# Patient Record
Sex: Female | Born: 1960 | Race: White | Hispanic: No | Marital: Single | Smoking: Former smoker
Health system: Southern US, Community
[De-identification: ages and names within clinical notes are randomized; demographics above are authoritative.]

## PROBLEM LIST (undated history)

## (undated) DIAGNOSIS — E785 Hyperlipidemia, unspecified: Secondary | ICD-10-CM

## (undated) DIAGNOSIS — G43909 Migraine, unspecified, not intractable, without status migrainosus: Secondary | ICD-10-CM

## (undated) DIAGNOSIS — F32A Depression, unspecified: Secondary | ICD-10-CM

## (undated) DIAGNOSIS — Z923 Personal history of irradiation: Secondary | ICD-10-CM

## (undated) DIAGNOSIS — F329 Major depressive disorder, single episode, unspecified: Secondary | ICD-10-CM

## (undated) DIAGNOSIS — K589 Irritable bowel syndrome without diarrhea: Secondary | ICD-10-CM

## (undated) DIAGNOSIS — C50919 Malignant neoplasm of unspecified site of unspecified female breast: Secondary | ICD-10-CM

## (undated) DIAGNOSIS — Z789 Other specified health status: Secondary | ICD-10-CM

## (undated) HISTORY — PX: OOPHORECTOMY: SHX86

## (undated) HISTORY — PX: POLYPECTOMY: SHX149

## (undated) HISTORY — DX: Depression, unspecified: F32.A

## (undated) HISTORY — DX: Migraine, unspecified, not intractable, without status migrainosus: G43.909

## (undated) HISTORY — DX: Irritable bowel syndrome, unspecified: K58.9

## (undated) HISTORY — PX: HERNIA REPAIR: SHX51

## (undated) HISTORY — DX: Hyperlipidemia, unspecified: E78.5

## (undated) HISTORY — PX: TUBAL LIGATION: SHX77

---
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
## 2008-06-21 HISTORY — PX: CHOLECYSTECTOMY: SHX55

## 2011-07-08 ENCOUNTER — Other Ambulatory Visit: Payer: Self-pay | Admitting: Family Medicine

## 2011-07-08 DIAGNOSIS — M25511 Pain in right shoulder: Secondary | ICD-10-CM

## 2011-07-13 ENCOUNTER — Ambulatory Visit
Admission: RE | Admit: 2011-07-13 | Discharge: 2011-07-13 | Disposition: A | Discharge status: Home / Self Care | Payer: No Typology Code available for payment source | Source: Ambulatory Visit | Attending: Family Medicine | Admitting: Family Medicine

## 2011-07-13 DIAGNOSIS — M25511 Pain in right shoulder: Secondary | ICD-10-CM

## 2012-04-10 ENCOUNTER — Other Ambulatory Visit (HOSPITAL_COMMUNITY)
Admission: RE | Admit: 2012-04-10 | Discharge: 2012-04-10 | Disposition: A | Discharge status: Home / Self Care | Payer: 59 | Source: Ambulatory Visit | Attending: Family Medicine | Admitting: Family Medicine

## 2012-04-10 DIAGNOSIS — Z0000 Encounter for general adult medical examination without abnormal findings: Secondary | ICD-10-CM | POA: Insufficient documentation

## 2012-04-17 ENCOUNTER — Other Ambulatory Visit: Payer: Self-pay | Admitting: Physician Assistant

## 2012-04-17 DIAGNOSIS — Z1231 Encounter for screening mammogram for malignant neoplasm of breast: Secondary | ICD-10-CM

## 2012-05-23 ENCOUNTER — Ambulatory Visit
Admission: RE | Admit: 2012-05-23 | Discharge: 2012-05-23 | Disposition: A | Discharge status: Home / Self Care | Payer: 59 | Source: Ambulatory Visit | Attending: Physician Assistant | Admitting: Physician Assistant

## 2012-05-23 DIAGNOSIS — Z1231 Encounter for screening mammogram for malignant neoplasm of breast: Secondary | ICD-10-CM

## 2012-07-04 ENCOUNTER — Other Ambulatory Visit: Payer: Self-pay | Admitting: Physician Assistant

## 2012-07-04 DIAGNOSIS — R928 Other abnormal and inconclusive findings on diagnostic imaging of breast: Secondary | ICD-10-CM

## 2012-08-15 ENCOUNTER — Ambulatory Visit
Admission: RE | Admit: 2012-08-15 | Discharge: 2012-08-15 | Disposition: A | Discharge status: Home / Self Care | Payer: 59 | Source: Ambulatory Visit | Attending: Physician Assistant | Admitting: Physician Assistant

## 2012-08-15 ENCOUNTER — Other Ambulatory Visit: Payer: Self-pay | Admitting: Physician Assistant

## 2012-08-15 DIAGNOSIS — R928 Other abnormal and inconclusive findings on diagnostic imaging of breast: Secondary | ICD-10-CM

## 2012-08-15 DIAGNOSIS — R921 Mammographic calcification found on diagnostic imaging of breast: Secondary | ICD-10-CM

## 2012-08-16 ENCOUNTER — Ambulatory Visit
Admission: RE | Admit: 2012-08-16 | Discharge: 2012-08-16 | Disposition: A | Discharge status: Home / Self Care | Payer: 59 | Source: Ambulatory Visit | Attending: Physician Assistant | Admitting: Physician Assistant

## 2012-08-16 ENCOUNTER — Other Ambulatory Visit: Payer: Self-pay | Admitting: Physician Assistant

## 2012-08-16 DIAGNOSIS — R921 Mammographic calcification found on diagnostic imaging of breast: Secondary | ICD-10-CM

## 2012-08-16 HISTORY — PX: BREAST BIOPSY: SHX20

## 2012-08-17 ENCOUNTER — Other Ambulatory Visit: Payer: Self-pay | Admitting: Family Medicine

## 2012-08-17 ENCOUNTER — Ambulatory Visit
Admission: RE | Admit: 2012-08-17 | Discharge: 2012-08-17 | Disposition: A | Discharge status: Home / Self Care | Payer: 59 | Source: Ambulatory Visit | Attending: Physician Assistant | Admitting: Physician Assistant

## 2012-08-17 DIAGNOSIS — C50912 Malignant neoplasm of unspecified site of left female breast: Secondary | ICD-10-CM

## 2012-08-17 DIAGNOSIS — R921 Mammographic calcification found on diagnostic imaging of breast: Secondary | ICD-10-CM

## 2012-08-21 ENCOUNTER — Other Ambulatory Visit (INDEPENDENT_AMBULATORY_CARE_PROVIDER_SITE_OTHER): Payer: Self-pay | Admitting: Surgery

## 2012-08-21 DIAGNOSIS — C50912 Malignant neoplasm of unspecified site of left female breast: Secondary | ICD-10-CM

## 2012-08-22 ENCOUNTER — Encounter (INDEPENDENT_AMBULATORY_CARE_PROVIDER_SITE_OTHER): Payer: Self-pay | Admitting: Surgery

## 2012-08-22 ENCOUNTER — Ambulatory Visit (INDEPENDENT_AMBULATORY_CARE_PROVIDER_SITE_OTHER): Payer: 59 | Admitting: Surgery

## 2012-08-22 VITALS — BP 130/86 | HR 68 | Temp 97.8°F | Resp 16 | Ht 62.5 in | Wt 157.6 lb

## 2012-08-22 DIAGNOSIS — C50912 Malignant neoplasm of unspecified site of left female breast: Secondary | ICD-10-CM

## 2012-08-22 DIAGNOSIS — C50919 Malignant neoplasm of unspecified site of unspecified female breast: Secondary | ICD-10-CM

## 2012-08-22 NOTE — Progress Notes (Signed)
Patient ID: Destiny Harrell, female   DOB: 04/01/1961, 51 y.o.   MRN: [id number]  No chief complaint on file.   HPI Destiny Harrell is a 51 y.o. female.  Patient sent at the request of Dr.Arceo for left breast microcalcifications. 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 No history of breast mass, breast pain or nipple discharge. Mother had breast cancer and had mastectomy. No other first-degree relatives noted. HPI  Past Medical History  Diagnosis Date  . IBS (irritable bowel syndrome)     Past Surgical History  Procedure Laterality Date  . Cesarean section  1984, 1986, 1988  . Oophorectomy  Right 1999, Left 2001  . Cholecystectomy  2003    Family History  Problem Relation Age of Onset  . Cancer Mother     Breast Cancer  . Cancer Father     Hodgkin Lymphoma    Social History History  Substance Use Topics  . Smoking status: Current Every Day Smoker  . Smokeless tobacco: Not on file  . Alcohol Use: Yes    No Known Allergies  No current outpatient prescriptions on file.   No current facility-administered medications for this visit.    Review of Systems Review of Systems  Constitutional: Negative for fever, chills and unexpected weight change.  HENT: Negative for hearing loss, congestion, sore throat, trouble swallowing and voice change.   Eyes: Negative for visual disturbance.  Respiratory: Negative for cough and wheezing.   Cardiovascular: Negative for chest pain, palpitations and leg swelling.  Gastrointestinal: Negative for nausea, vomiting, abdominal pain, diarrhea, constipation, blood in stool, abdominal distention and anal bleeding.  Genitourinary: Negative for hematuria, vaginal bleeding and difficulty urinating.  Musculoskeletal: Negative for arthralgias.  Skin: Negative for rash and wound.  Neurological: Negative for seizures, syncope and headaches.  Hematological: Negative for adenopathy. Does not bruise/bleed easily.    Psychiatric/Behavioral: Negative for confusion.    Blood pressure 130/86, pulse 68, temperature 97.8 F (36.6 C), temperature source Temporal, resp. rate 16, height 5' 2.5" (1.588 m), weight 157 lb 9.6 oz (71.487 kg).  Physical Exam Physical Exam  Constitutional: She is oriented to person, place, and time. She appears well-developed and well-nourished.  HENT:  Head: Normocephalic and atraumatic.  Eyes: EOM are normal. Pupils are equal, round, and reactive to light.  Neck: Normal range of motion. Neck supple.  Cardiovascular: Normal rate and regular rhythm.   Pulmonary/Chest: Effort normal and breath sounds normal. Right breast exhibits no inverted nipple, no mass, no nipple discharge, no skin change and no tenderness. Left breast exhibits mass. Left breast exhibits no inverted nipple, no nipple discharge, no skin change and no tenderness. Breasts are symmetrical.    Musculoskeletal: Normal range of motion.  Neurological: She is alert and oriented to person, place, and time.  Skin: Skin is warm and dry.  Psychiatric: She has a normal mood and affect. Her behavior is normal. Thought content normal.    Data Reviewed Clinical Data: The patient was called back for calcifications on  recent screening mammogram  DIGITAL DIAGNOSTIC LEFT MAMMOGRAM  Comparison: Mammograms dated 05/24/2012, 06/29/2010, 06/02/2009 and  04/22/2008  Findings:  ACR Breast Density Category 2: There is a scattered fibroglandular  pattern.  Magnification views of the central portion of the left breast  demonstrate developing pleomorphic calcifications following a  ductal pattern measuring 3.5 cm. There is no associated mass.  IMPRESSION:  Suspicious left breast calcifications. Tissue sampling is  recommended.  RECOMMENDATION:  Stereotactic biopsy   of the left breast will be scheduled at the  patient's convenience.  I have discussed the findings and recommendations with the patient.  Results were also  provided in writing at the conclusion of the  visit.  BI-RADS CATEGORY 4: Suspicious abnormality - biopsy should be  considered.    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  Refer to medical oncology.  May need genetics as well.         Tyrisha Benninger A. 08/22/2012, 11:06 AM    

## 2012-08-22 NOTE — Patient Instructions (Signed)
Return next week after MRI and other results back to set up treatment planning.

## 2012-08-23 ENCOUNTER — Ambulatory Visit
Admission: RE | Admit: 2012-08-23 | Discharge: 2012-08-23 | Disposition: A | Discharge status: Home / Self Care | Payer: 59 | Source: Ambulatory Visit | Attending: Family Medicine | Admitting: Family Medicine

## 2012-08-23 DIAGNOSIS — C50912 Malignant neoplasm of unspecified site of left female breast: Secondary | ICD-10-CM

## 2012-08-23 MED ORDER — GADOBENATE DIMEGLUMINE 529 MG/ML IV SOLN
15.0000 mL | Freq: Once | INTRAVENOUS | Status: AC | PRN
  Administered 2012-08-23: 15 mL via INTRAVENOUS

## 2012-08-28 ENCOUNTER — Encounter (INDEPENDENT_AMBULATORY_CARE_PROVIDER_SITE_OTHER): Payer: Self-pay | Admitting: Surgery

## 2012-08-28 ENCOUNTER — Other Ambulatory Visit (INDEPENDENT_AMBULATORY_CARE_PROVIDER_SITE_OTHER): Payer: Self-pay

## 2012-08-28 ENCOUNTER — Encounter (HOSPITAL_BASED_OUTPATIENT_CLINIC_OR_DEPARTMENT_OTHER): Payer: Self-pay | Admitting: *Deleted

## 2012-08-28 ENCOUNTER — Ambulatory Visit (INDEPENDENT_AMBULATORY_CARE_PROVIDER_SITE_OTHER): Payer: 59 | Admitting: Surgery

## 2012-08-28 VITALS — BP 124/80 | HR 68 | Resp 12 | Ht 62.5 in | Wt 160.4 lb

## 2012-08-28 DIAGNOSIS — C50919 Malignant neoplasm of unspecified site of unspecified female breast: Secondary | ICD-10-CM

## 2012-08-28 DIAGNOSIS — C50912 Malignant neoplasm of unspecified site of left female breast: Secondary | ICD-10-CM

## 2012-08-28 NOTE — Patient Instructions (Addendum)
Swollen Lymph Nodes The lymphatic system filters fluid from around cells. It is like a system of blood vessels. These channels carry lymph instead of blood. The lymphatic system is an important part of the immune (disease fighting) system. When people talk about "swollen glands in the neck," they are usually talking about swollen lymph nodes. The lymph nodes are like the little traps for infection. You and your caregiver may be able to feel lymph nodes, especially swollen nodes, in these common areas: the groin (inguinal area), armpits (axilla), and above the clavicle (supraclavicular). You may also feel them in the neck (cervical) and the back of the head just above the hairline (occipital). Swollen glands occur when there is any condition in which the body responds with an allergic type of reaction. For instance, the glands in the neck can become swollen from insect bites or any type of minor infection on the head. These are very noticeable in children with only minor problems. Lymph nodes may also become swollen when there is a tumor or problem with the lymphatic system, such as Hodgkin's disease. TREATMENT   Most swollen glands do not require treatment. They can be observed (watched) for a short period of time, if your caregiver feels it is necessary. Most of the time, observation is not necessary.  Antibiotics (medicines that kill germs) may be prescribed by your caregiver. Your caregiver may prescribe these if he or she feels the swollen glands are due to a bacterial (germ) infection. Antibiotics are not used if the swollen glands are caused by a virus. HOME CARE INSTRUCTIONS   Take medications as directed by your caregiver. Only take over-the-counter or prescription medicines for pain, discomfort, or fever as directed by your caregiver. SEEK MEDICAL CARE IF:   If you begin to run a temperature greater than 102 F (38.9 C), or as your caregiver suggests. MAKE SURE YOU:   Understand these  instructions.  Will watch your condition.  Will get help right away if you are not doing well or get worse. Document Released: 05/28/2002 Document Revised: 08/30/2011 Document Reviewed: 06/07/2005 [facility] Patient Information 2013 [place], [place]. Lumpectomy, Breast Conserving Surgery A lumpectomy is breast surgery that removes only part of the breast. Another name used may be partial mastectomy. The amount removed varies. Make sure you understand how much of your breast will be removed. Reasons for a lumpectomy:  Any solid breast mass.  Grouped significant nodularity that may be confused with a solitary breast mass. Lumpectomy is the most common form of breast cancer surgery today. The surgeon removes the portion of your breast which contains the tumor (cancer). This is the lump. Some normal tissue around the lump is also removed to be sure that all the tumor has been removed.  If cancer cells are found in the margins where the breast tissue was removed, your surgeon will do more surgery to remove the remaining cancer tissue. This is called re-excision surgery. Radiation and/or chemotherapy treatments are often given following a lumpectomy to kill any cancer cells that could possibly remain.  REASONS YOU MAY NOT BE ABLE TO HAVE BREAST CONSERVING SURGERY:  The tumor is located in more than one place.  Your breast is small and the tumor is large so the breast would be disfigured.  The entire tumor removal is not successful with a lumpectomy.  You cannot commit to a full course of chemotherapy, radiation therapy or are pregnant and cannot have radiation.  You have previously had radiation to the breast  to treat cancer. HOW A LUMPECTOMY IS PERFORMED If overnight nursing is not required following a biopsy, a lumpectomy can be performed as a same-day surgery. This can be done in a hospital, clinic, or surgical center. The anesthesia used will depend on your surgeon. They will discuss this with  you. A general anesthetic keeps you sleeping through the procedure. LET YOUR CAREGIVERS KNOW ABOUT THE FOLLOWING:  Allergies  Medications taken including herbs, eye drops, over the counter medications, and creams.  Use of steroids (by mouth or creams)  Previous problems with anesthetics or Novocaine.  Possibility of pregnancy, if this applies  History of blood clots (thrombophlebitis)  History of bleeding or blood problems.  Previous surgery  Other health problems BEFORE THE PROCEDURE You should be present one hour prior to your procedure unless directed otherwise.  AFTER THE PROCEDURE  After surgery, you will be taken to the recovery area where a nurse will watch and check your progress. Once you're awake, stable, and taking fluids well, barring other problems you will be allowed to go home.  Ice packs applied to your operative site may help with discomfort and keep the swelling down.  A small rubber drain may be placed in the breast for a couple of days to prevent a hematoma from developing in the breast.  A pressure dressing may be applied for 24 to 48 hours to prevent bleeding.  Keep the wound dry.  You may resume a normal diet and activities as directed. Avoid strenuous activities affecting the arm on the side of the biopsy site such as tennis, swimming, heavy lifting (more than 10 pounds) or pulling.  Bruising in the breast is normal following this procedure.  Wearing a bra - even to bed - may be more comfortable and also help keep the dressing on.  Change dressings as directed.  Only take over-the-counter or prescription medicines for pain, discomfort, or fever as directed by your caregiver. Call for your results as instructed by your surgeon. Remember it is your responsibility to get the results of your lumpectomy if your surgeon asked you to follow-up. Do not assume everything is fine if you have not heard from your caregiver. SEEK MEDICAL CARE IF:   There is  increased bleeding (more than a small spot) from the wound.  You notice redness, swelling, or increasing pain in the wound.  Pus is coming from wound.  An unexplained oral temperature above 102 F (38.9 C) develops.  You notice a foul smell coming from the wound or dressing. SEEK IMMEDIATE MEDICAL CARE IF:   You develop a rash.  You have difficulty breathing.  You have any allergic problems. Document Released: 07/19/2006 Document Revised: 08/30/2011 Document Reviewed: 10/20/2006 [facility] Patient Information 2013 [place], [place]. Sentinel Lymph Node Analysis in Breast Cancer Treatment WHAT IS A LYMPH NODE? Lymph nodes are little glands that lie along the lymph channels and serve to trap infections in the body. These are the small vessel-like structures that carry the fluid (lymph) from body tissues away to be filtered. These are the "glands" that feel swollen in the neck when you or your child has a sore throat. These glands in your armpit are where breast cancer tends to spread first. WHAT IS SENTINEL LYMPH NODE ANALYSIS? Sentinel lymph node study is a new cancer diagnostic procedure. It aims to look at the most likely first spread of breast cancer. It involves looking at the lymph node or nodes where breast cancer is likely to travel next.  PROCEDURE  A small amount of blue dye and radioactive tracer are injected around the tumor in the breast. The dye and tracer follow the same path that a spreading cancer would be likely to follow. With the use of a Conservation officer, nature, the radioactive tracer can be located in the lymph node that is the gatekeeper to other lymph nodes in the armpit. The sentinel lymph node is the first lymph node in a chain of lymph nodes that drain the lymph from the breast. The blue dye enables the surgeon to identify this sentinel node. This node can be removed and examined. If no cancer is found in this node, no further removal of lymph nodes is recommended. In this case,  the spread of cancer to the other lymph nodes is rare. This eliminates any more surgery in the armpit and risk of complications. If the lymph node shows spread of the cancer from the breast, the other lymph nodes in the armpit are removed and analyzed. This helps the doctor and patient decide how much more surgery is needed and if chemotherapy and/or radiation treatment is necessary following the surgery.  BENEFITS  The pathology tests for this procedure are much more sensitive than was previously available.  This technique is thought to be a major improvement in the treatment of breast cancer.  This procedure allows many patients to avoid the effects of more extensive underarm lymph node removal and risk of complications (infection, bleeding, or severe arm swelling).  Survival rates from breast cancer are better and the risk of complications isreduced. Document Released: 06/07/2005 Document Revised: 08/30/2011 Document Reviewed: 02/21/2007 [facility] Patient Information 2013 [place], [place].

## 2012-08-28 NOTE — Progress Notes (Signed)
Patient ID: Destiny Harrell, female   DOB: 1961/06/09, 52 y.o.   MRN: [id number]  No chief complaint on file.   HPI Destiny Harrell is a 52 y.o. female.  Patient sent at the request of Dr.Arceo for left breast microcalcifications. 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 No history of breast mass, breast pain or nipple discharge. Mother had breast cancer and had mastectomy. No other first-degree relatives noted. Patient returns after MRI. HPI  Past Medical History  Diagnosis Date  . IBS (irritable bowel syndrome)     Past Surgical History  Procedure Laterality Date  . Cesarean section  1984, 1986, 1988  . Oophorectomy  Right 1999, Left 2001  . Cholecystectomy  2003    Family History  Problem Relation Age of Onset  . Cancer Mother     Breast Cancer  . Cancer Father     Hodgkin Lymphoma    Social History History  Substance Use Topics  . Smoking status: Former Smoker    Quit date: 08/21/2012  . Smokeless tobacco: Not on file  . Alcohol Use: Yes    No Known Allergies  No current outpatient prescriptions on file.   No current facility-administered medications for this visit.    Review of Systems Review of Systems  Constitutional: Negative for fever, chills and unexpected weight change.  HENT: Negative for hearing loss, congestion, sore throat, trouble swallowing and voice change.   Eyes: Negative for visual disturbance.  Respiratory: Negative for cough and wheezing.   Cardiovascular: Negative for chest pain, palpitations and leg swelling.  Gastrointestinal: Negative for nausea, vomiting, abdominal pain, diarrhea, constipation, blood in stool, abdominal distention and anal bleeding.  Genitourinary: Negative for hematuria, vaginal bleeding and difficulty urinating.  Musculoskeletal: Negative for arthralgias.  Skin: Negative for rash and wound.  Neurological: Negative for seizures, syncope and headaches.  Hematological: Negative for  adenopathy. Does not bruise/bleed easily.  Psychiatric/Behavioral: Negative for confusion.    Blood pressure 124/80, pulse 68, resp. rate 12, height 5' 2.5" (1.588 m), weight 160 lb 6.4 oz (72.757 kg).  Physical Exam Physical Exam  Constitutional: She is oriented to person, place, and time. She appears well-developed and well-nourished.  HENT:  Head: Normocephalic and atraumatic.  Eyes: EOM are normal. Pupils are equal, round, and reactive to light.  Neck: Normal range of motion. Neck supple.  Cardiovascular: Normal rate and regular rhythm.   Pulmonary/Chest: Effort normal and breath sounds normal. Right breast exhibits no inverted nipple, no mass, no nipple discharge, no skin change and no tenderness. Left breast exhibits mass. Left breast exhibits no inverted nipple, no nipple discharge, no skin change and no tenderness. Breasts are symmetrical.    Musculoskeletal: Normal range of motion.  Neurological: She is alert and oriented to person, place, and time.  Skin: Skin is warm and dry.  Psychiatric: She has a normal mood and affect. Her behavior is normal. Thought content normal.    Data Reviewed Clinical Data: The patient was called back for calcifications on  recent screening mammogram  DIGITAL DIAGNOSTIC LEFT MAMMOGRAM  Comparison: Mammograms dated 05/24/2012, 06/29/2010, 06/02/2009 and  04/22/2008  Findings:  ACR Breast Density Category 2: There is a scattered fibroglandular  pattern.  Magnification views of the central portion of the left breast  demonstrate developing pleomorphic calcifications following a  ductal pattern measuring 3.5 cm. There is no associated mass.  IMPRESSION:  Suspicious left breast calcifications. Tissue sampling is  recommended.  RECOMMENDATION:  Stereotactic biopsy of  the left breast will be scheduled at the  patient's convenience.  I have discussed the findings and recommendations with the patient.  Results were also provided in writing at  the conclusion of the  visit.  BI-RADS CATEGORY 4: Suspicious abnormality - biopsy should be  considered.  *RADIOLOGY REPORT*  Clinical Data: new diagnosis of dcis related to stereotactic biopsy  of calcifications central left breast  BILATERAL BREAST MRI WITH AND WITHOUT CONTRAST  Technique: Multiplanar, multisequence MR images of both breasts  were obtained prior to and following the intravenous administration  of 15ml of Multihance. Three dimensional images were evaluated at  the independent DynaCad workstation.  Comparison: mammograpy performed 05/24/12, 07/26/12, 08/16/12  Findings: There is mild background parenchymal enhancement. There  are no suspicious findings on the right.  On the left, there is a 2cm biopsy cavity associated with marker  clip in the central slightly superior breast. Extending anteriorly  and inferiorly from the clip is linear low-grade enhancement,  extending 3.5cm anterior to the clip/biopsy cavity. This is  distinctly separate from the biopsy tract, which extends directly  inferiorly to skin surface of the inferior breast.  There is no adenopathy or other suspicious finding.  IMPRESSION:  Suspicious linear enhancement extending 3.5cm anterior to the  biopsy site.  BI-RADS CATEGORY 6: Known biopsy-proven malignancy - appropriate  action should be taken.   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 Feel that lumpectomy as possible the situation I did cautioned against possible cosmetic deformity. I do not think it neoadjuvant chemotherapy would be helpful since most of this is linear enhancement consistent with DCIS with microinvasion. She would like to try breast conservation which I feel is reasonable. Or further medical oncology and genetics. She would like to proceed with surgical therapy. We'll arrange for left breast lumpectomy with sentinel lymph node  mapping.The procedure has been discussed with the patient. Alternatives to surgery have been discussed with the patient.  Risks of surgery include bleeding,  Infection,  Seroma formation, death,  and the need for further surgery.   The patient understands and wishes to proceed.Sentinel lymph node mapping and dissection has been discussed with the patient.  Risk of bleeding,  Infection,  Seroma formation,  Additional procedures,,  Shoulder weakness ,  Shoulder stiffness,  Nerve and blood vessel injury and reaction to the mapping dyes have been discussed.  Alternatives to surgery have been discussed with the patient.  The patient agrees to proceed.       CORNETT,THOMAS A. 08/28/2012, 11:06 AM

## 2012-08-28 NOTE — Progress Notes (Signed)
No labs needed

## 2012-08-31 ENCOUNTER — Ambulatory Visit (HOSPITAL_BASED_OUTPATIENT_CLINIC_OR_DEPARTMENT_OTHER)
Admission: RE | Admit: 2012-08-31 | Discharge: 2012-08-31 | Disposition: A | Discharge status: Home / Self Care | Payer: 59 | Source: Ambulatory Visit | Attending: Surgery | Admitting: Surgery

## 2012-08-31 ENCOUNTER — Encounter (HOSPITAL_BASED_OUTPATIENT_CLINIC_OR_DEPARTMENT_OTHER): Payer: Self-pay | Admitting: Anesthesiology

## 2012-08-31 ENCOUNTER — Encounter (HOSPITAL_BASED_OUTPATIENT_CLINIC_OR_DEPARTMENT_OTHER): Payer: Self-pay

## 2012-08-31 ENCOUNTER — Encounter (HOSPITAL_COMMUNITY)
Admission: RE | Admit: 2012-08-31 | Discharge: 2012-08-31 | Disposition: A | Discharge status: Home / Self Care | Payer: 59 | Source: Ambulatory Visit | Attending: Surgery | Admitting: Surgery

## 2012-08-31 ENCOUNTER — Ambulatory Visit (HOSPITAL_BASED_OUTPATIENT_CLINIC_OR_DEPARTMENT_OTHER): Payer: 59 | Admitting: Anesthesiology

## 2012-08-31 ENCOUNTER — Ambulatory Visit
Admission: RE | Admit: 2012-08-31 | Discharge: 2012-08-31 | Disposition: A | Discharge status: Home / Self Care | Payer: 59 | Source: Ambulatory Visit | Attending: Surgery | Admitting: Surgery

## 2012-08-31 ENCOUNTER — Encounter (HOSPITAL_BASED_OUTPATIENT_CLINIC_OR_DEPARTMENT_OTHER)
Admission: RE | Disposition: A | Discharge status: Home / Self Care | Payer: Self-pay | Source: Ambulatory Visit | Attending: Surgery

## 2012-08-31 DIAGNOSIS — C50919 Malignant neoplasm of unspecified site of unspecified female breast: Secondary | ICD-10-CM | POA: Insufficient documentation

## 2012-08-31 DIAGNOSIS — C50912 Malignant neoplasm of unspecified site of left female breast: Secondary | ICD-10-CM

## 2012-08-31 DIAGNOSIS — Z803 Family history of malignant neoplasm of breast: Secondary | ICD-10-CM | POA: Insufficient documentation

## 2012-08-31 DIAGNOSIS — D0590 Unspecified type of carcinoma in situ of unspecified breast: Secondary | ICD-10-CM

## 2012-08-31 DIAGNOSIS — Z87891 Personal history of nicotine dependence: Secondary | ICD-10-CM | POA: Insufficient documentation

## 2012-08-31 HISTORY — PX: BREAST LUMPECTOMY WITH NEEDLE LOCALIZATION AND AXILLARY SENTINEL LYMPH NODE BX: SHX5760

## 2012-08-31 HISTORY — PX: BREAST SURGERY: SHX581

## 2012-08-31 HISTORY — PX: BREAST LUMPECTOMY: SHX2

## 2012-08-31 HISTORY — DX: Other specified health status: Z78.9

## 2012-08-31 SURGERY — BREAST LUMPECTOMY WITH NEEDLE LOCALIZATION AND AXILLARY SENTINEL LYMPH NODE BX
Anesthesia: General | Site: Breast | Laterality: Left | Wound class: Clean

## 2012-08-31 MED ORDER — PROPOFOL 10 MG/ML IV BOLUS
INTRAVENOUS | Status: DC | PRN
  Administered 2012-08-31: 170 mg via INTRAVENOUS

## 2012-08-31 MED ORDER — DEXAMETHASONE SODIUM PHOSPHATE 4 MG/ML IJ SOLN
INTRAMUSCULAR | Status: DC | PRN
  Administered 2012-08-31: 10 mg via INTRAVENOUS

## 2012-08-31 MED ORDER — KETOROLAC TROMETHAMINE 30 MG/ML IJ SOLN
INTRAMUSCULAR | Status: DC | PRN
  Administered 2012-08-31: 30 mg via INTRAVENOUS

## 2012-08-31 MED ORDER — DEXTROSE 5 % IV SOLN
3.0000 g | INTRAVENOUS | Status: AC
  Administered 2012-08-31: 2 g via INTRAVENOUS

## 2012-08-31 MED ORDER — FENTANYL CITRATE 0.05 MG/ML IJ SOLN
100.0000 ug | Freq: Once | INTRAMUSCULAR | Status: AC
  Administered 2012-08-31: 100 ug via INTRAVENOUS

## 2012-08-31 MED ORDER — LACTATED RINGERS IV SOLN
INTRAVENOUS | Status: DC
  Administered 2012-08-31: 10:00:00 via INTRAVENOUS

## 2012-08-31 MED ORDER — OXYCODONE HCL 5 MG PO TABS
5.0000 mg | ORAL_TABLET | Freq: Once | ORAL | Status: AC
  Administered 2012-08-31: 5 mg via ORAL

## 2012-08-31 MED ORDER — OXYCODONE-ACETAMINOPHEN 5-325 MG PO TABS: 1.0000 | ORAL_TABLET | ORAL | Status: DC | PRN

## 2012-08-31 MED ORDER — LIDOCAINE HCL (CARDIAC) 20 MG/ML IV SOLN
INTRAVENOUS | Status: DC | PRN
  Administered 2012-08-31: 50 mg via INTRAVENOUS

## 2012-08-31 MED ORDER — MIDAZOLAM HCL 2 MG/2ML IJ SOLN
2.0000 mg | Freq: Once | INTRAMUSCULAR | Status: AC
  Administered 2012-08-31: 2 mg via INTRAVENOUS

## 2012-08-31 MED ORDER — ONDANSETRON HCL 4 MG/2ML IJ SOLN: 4.0000 mg | Freq: Once | INTRAMUSCULAR | Status: DC | PRN

## 2012-08-31 MED ORDER — ONDANSETRON HCL 4 MG/2ML IJ SOLN
INTRAMUSCULAR | Status: DC | PRN
  Administered 2012-08-31: 4 mg via INTRAVENOUS

## 2012-08-31 MED ORDER — HYDROMORPHONE HCL PF 1 MG/ML IJ SOLN
0.2500 mg | INTRAMUSCULAR | Status: DC | PRN
  Administered 2012-08-31: 0.5 mg via INTRAVENOUS

## 2012-08-31 MED ORDER — BUPIVACAINE-EPINEPHRINE PF 0.5-1:200000 % IJ SOLN
INTRAMUSCULAR | Status: DC | PRN
  Administered 2012-08-31: 20 mL

## 2012-08-31 MED ORDER — TECHNETIUM TC 99M SULFUR COLLOID FILTERED
1.0000 | Freq: Once | INTRAVENOUS | Status: AC | PRN
  Administered 2012-08-31: 1 via INTRADERMAL

## 2012-08-31 MED ORDER — ACETAMINOPHEN 10 MG/ML IV SOLN: 1000.0000 mg | Freq: Once | INTRAVENOUS | Status: DC | PRN

## 2012-08-31 MED ORDER — CHLORHEXIDINE GLUCONATE 4 % EX LIQD: 1.0000 "application " | Freq: Once | CUTANEOUS | Status: DC

## 2012-08-31 SURGICAL SUPPLY — 57 items
APPLIER CLIP 9.375 MED OPEN (MISCELLANEOUS) ×2
BANDAGE ELASTIC 6 VELCRO ST LF (GAUZE/BANDAGES/DRESSINGS) IMPLANT
BINDER BREAST LRG (GAUZE/BANDAGES/DRESSINGS) ×2 IMPLANT
BINDER BREAST MEDIUM (GAUZE/BANDAGES/DRESSINGS) IMPLANT
BINDER BREAST XLRG (GAUZE/BANDAGES/DRESSINGS) IMPLANT
BINDER BREAST XXLRG (GAUZE/BANDAGES/DRESSINGS) IMPLANT
BLADE SURG 10 STRL SS (BLADE) ×2 IMPLANT
BLADE SURG 15 STRL LF DISP TIS (BLADE) ×1 IMPLANT
BLADE SURG 15 STRL SS (BLADE) ×1
BLADE SURG ROTATE 9660 (MISCELLANEOUS) IMPLANT
CANISTER SUCTION 1200CC (MISCELLANEOUS) ×2 IMPLANT
CHLORAPREP W/TINT 26ML (MISCELLANEOUS) ×2 IMPLANT
CLIP APPLIE 9.375 MED OPEN (MISCELLANEOUS) ×1 IMPLANT
CLOTH BEACON ORANGE TIMEOUT ST (SAFETY) ×2 IMPLANT
COVER MAYO STAND STRL (DRAPES) ×2 IMPLANT
COVER PROBE W GEL 5X96 (DRAPES) ×2 IMPLANT
COVER TABLE BACK 60X90 (DRAPES) ×2 IMPLANT
DECANTER SPIKE VIAL GLASS SM (MISCELLANEOUS) IMPLANT
DERMABOND ADVANCED (GAUZE/BANDAGES/DRESSINGS) ×2
DERMABOND ADVANCED .7 DNX12 (GAUZE/BANDAGES/DRESSINGS) ×2 IMPLANT
DEVICE DUBIN W/COMP PLATE 8390 (MISCELLANEOUS) IMPLANT
DRAIN CHANNEL 19F RND (DRAIN) IMPLANT
DRAIN HEMOVAC 1/8 X 5 (WOUND CARE) IMPLANT
DRAPE LAPAROSCOPIC ABDOMINAL (DRAPES) ×2 IMPLANT
DRAPE UTILITY XL STRL (DRAPES) ×2 IMPLANT
ELECT COATED BLADE 2.86 ST (ELECTRODE) ×2 IMPLANT
ELECT REM PT RETURN 9FT ADLT (ELECTROSURGICAL) ×2
ELECTRODE REM PT RTRN 9FT ADLT (ELECTROSURGICAL) ×1 IMPLANT
EVACUATOR SILICONE 100CC (DRAIN) IMPLANT
GAUZE SPONGE 4X4 12PLY STRL LF (GAUZE/BANDAGES/DRESSINGS) IMPLANT
GLOVE BIOGEL PI IND STRL 8 (GLOVE) ×1 IMPLANT
GLOVE BIOGEL PI INDICATOR 8 (GLOVE) ×1
GLOVE ECLIPSE 8.0 STRL XLNG CF (GLOVE) ×2 IMPLANT
GOWN PREVENTION PLUS XLARGE (GOWN DISPOSABLE) ×2 IMPLANT
HEMOSTAT SURGICEL 2X14 (HEMOSTASIS) ×2 IMPLANT
KIT MARKER MARGIN INK (KITS) ×2 IMPLANT
NDL SAFETY ECLIPSE 18X1.5 (NEEDLE) IMPLANT
NEEDLE HYPO 18GX1.5 SHARP (NEEDLE)
NEEDLE HYPO 25X1 1.5 SAFETY (NEEDLE) ×2 IMPLANT
NS IRRIG 1000ML POUR BTL (IV SOLUTION) ×2 IMPLANT
PACK BASIN DAY SURGERY FS (CUSTOM PROCEDURE TRAY) ×2 IMPLANT
PENCIL BUTTON HOLSTER BLD 10FT (ELECTRODE) ×2 IMPLANT
PIN SAFETY STERILE (MISCELLANEOUS) IMPLANT
SLEEVE SCD COMPRESS KNEE MED (MISCELLANEOUS) ×2 IMPLANT
SPONGE LAP 18X18 X RAY DECT (DISPOSABLE) IMPLANT
SPONGE LAP 4X18 X RAY DECT (DISPOSABLE) ×2 IMPLANT
SUT ETHILON 3 0 PS 1 (SUTURE) IMPLANT
SUT MNCRL AB 3-0 PS2 18 (SUTURE) ×4 IMPLANT
SUT SILK 2 0 SH (SUTURE) IMPLANT
SUT VICRYL 3-0 CR8 SH (SUTURE) ×2 IMPLANT
SYR BULB 3OZ (MISCELLANEOUS) ×2 IMPLANT
SYR CONTROL 10ML LL (SYRINGE) ×2 IMPLANT
TOWEL OR 17X24 6PK STRL BLUE (TOWEL DISPOSABLE) ×2 IMPLANT
TOWEL OR NON WOVEN STRL DISP B (DISPOSABLE) IMPLANT
TUBE CONNECTING 20X1/4 (TUBING) ×2 IMPLANT
WATER STERILE IRR 1000ML POUR (IV SOLUTION) IMPLANT
YANKAUER SUCT BULB TIP NO VENT (SUCTIONS) ×2 IMPLANT

## 2012-08-31 NOTE — Interval H&P Note (Signed)
History and Physical Interval Note:  08/31/2012 9:28 AM  Destiny Harrell  has presented today for surgery, with the diagnosis of left breast cancer   The various methods of treatment have been discussed with the patient and family. After consideration of risks, benefits and other options for treatment, the patient has consented to  Procedure(s) with comments: left BREAST needle localized  LUMPECTOMY WITH left sentinel lymph node mapping  (Left) - needle localization at breast center of [place] 7:30 nuclear medicine injection at 9:30 neoprobe   as a surgical intervention .  The patient's history has been reviewed, patient examined, no change in status, stable for surgery.  I have reviewed the patient's chart and labs.  Questions were answered to the patient's satisfaction.     CORNETT,THOMAS A.

## 2012-08-31 NOTE — Op Note (Signed)
Left Breast Lumpectomy with Sentinal Node Biopsy Procedure Note  Indications: This patient presents with history of left breast cancer with clinically negative axillary lymph node exam.The procedure has been discussed with the patient. Alternatives to surgery have been discussed with the patient.  Risks of surgery include bleeding,  Infection,  Seroma formation, death,  and the need for further surgery.   The patient understands and wishes to proceed.Sentinel lymph node mapping and dissection has been discussed with the patient.  Risk of bleeding,  Infection,  Seroma formation,  Additional procedures,,  Shoulder weakness ,  Shoulder stiffness,  Nerve and blood vessel injury and reaction to the mapping dyes have been discussed.  Alternatives to surgery have been discussed with the patient.  The patient agrees to proceed.  Pre-operative Diagnosis: left breast cancer  Post-operative Diagnosis: left breast cancer  Surgeon: Harriette Bouillon A.   Assistants: OR   Anesthesia: General LMA anesthesia and Local anesthesia 0.5% bupivacaine, with epinephrine  ASA Class: 2  Procedure Details  The patient was seen in the Holding Room. The risks, benefits, complications, treatment options, and expected outcomes were discussed with the patient. The possibilities of reaction to medication, pulmonary aspiration, bleeding, infection, the need for additional procedures, failure to diagnose a condition, and creating a complication requiring transfusion or operation were discussed with the patient. The patient concurred with the proposed plan, giving informed consent.  The site of surgery properly noted/marked. The patient was taken to Operating Room # 8, identified as Destiny Harrell and the procedure verified as Breast Lumpectomy and Sentinal Node Biopsy. A Time Out was held and the above information confirmed.  After induction of anesthesia, the left arm, breast, and chest were prepped and draped in standard fashion.   Using a hand-held gamma probe, axillary sentinel nodes were identified transcutaneously.  An oblique incision was created below the axillary hairline.  Dissection was carried through the clavipectoral fascia.  1 level 1 axillary sentinel nodes were removed and submitted to pathology.  Surgicel placed into the wound.   The axillary incision was closed with a 3-0 Vicryl and 4 O monocryl  subcuticular closure in layers.    The lumpectomy was performed by creating an oblique incision over the upper inner quadrant of the breastaround the previously placed localization guidewire.  Dissection was carried down to the pectoral fascia.  Orientation sutures were placed and the skin and pectoralis margins were inked. Clips placed.   Specimen radiography confirmed inclusion of the mammographic lesion.  Hemostasis was achieved with cautery.  The wound was irrigated and closed with a 3-0 Vicryl  And 4 O monocryl subcuticular closure in layers.    Sterile dressings were applied of Dermabond.  At the end of the operation, all sponge, instrument, and needle counts were correct.  Findings: grossly clear surgical margins  Estimated Blood Loss:  Minimal         Drains: none         Total IV Fluids: 1000 ml         Specimens: see above                 Complications:  None; patient tolerated the procedure well.         Disposition: PACU - hemodynamically stable.         Condition: stable  Attending Attestation: I performed the procedure.

## 2012-08-31 NOTE — Progress Notes (Signed)
Tolerated well nuc med injections

## 2012-08-31 NOTE — Transfer of Care (Signed)
Immediate Anesthesia Transfer of Care Note  Patient: Destiny Harrell  Procedure(s) Performed: Procedure(s) with comments: left BREAST needle localized  LUMPECTOMY WITH left sentinel lymph node mapping  (Left) - needle localization at breast center of [place] 7:30 nuclear medicine injection at 9:30 neoprobe    Patient Location: PACU  Anesthesia Type:General  Level of Consciousness: awake, sedated and patient cooperative  Airway & Oxygen Therapy: Patient Spontanous Breathing and Patient connected to face mask oxygen  Post-op Assessment: Report given to PACU RN and Post -op Vital signs reviewed and stable  Post vital signs: Reviewed and stable  Complications: No apparent anesthesia complications

## 2012-08-31 NOTE — Anesthesia Procedure Notes (Signed)
Procedure Name: LMA Insertion Date/Time: 08/31/2012 10:26 AM Performed by: Gar Gibbon Pre-anesthesia Checklist: Patient identified, Emergency Drugs available, Suction available and Patient being monitored Patient Re-evaluated:Patient Re-evaluated prior to inductionOxygen Delivery Method: Circle System Utilized Preoxygenation: Pre-oxygenation with 100% oxygen Intubation Type: IV induction Ventilation: Mask ventilation without difficulty LMA: LMA inserted LMA Size: 4.0 Number of attempts: 1 Airway Equipment and Method: bite block Placement Confirmation: positive ETCO2 Tube secured with: Tape Dental Injury: Teeth and Oropharynx as per pre-operative assessment

## 2012-08-31 NOTE — Anesthesia Preprocedure Evaluation (Signed)
Anesthesia Evaluation  Patient identified by MRN, date of birth, ID band Patient awake    Reviewed: Allergy & Precautions, H&P , NPO status   Airway Mallampati: II      Dental  (+) Teeth Intact and Dental Advisory Given   Pulmonary  breath sounds clear to auscultation        Cardiovascular Rhythm:Regular Rate:Normal     Neuro/Psych    GI/Hepatic   Endo/Other    Renal/GU      Musculoskeletal   Abdominal   Peds  Hematology   Anesthesia Other Findings   Reproductive/Obstetrics                           Anesthesia Physical Anesthesia Plan  ASA: I  Anesthesia Plan: General   Post-op Pain Management:    Induction: Intravenous  Airway Management Planned: LMA  Additional Equipment:   Intra-op Plan:   Post-operative Plan:   Informed Consent: I have reviewed the patients History and Physical, chart, labs and discussed the procedure including the risks, benefits and alternatives for the proposed anesthesia with the patient or authorized representative who has indicated his/her understanding and acceptance.   Dental advisory given  Plan Discussed with: CRNA and Surgeon  Anesthesia Plan Comments:         Anesthesia Quick Evaluation

## 2012-08-31 NOTE — H&P (View-Only) (Signed)
Patient ID: Destiny Harrell, female   DOB: 03/17/1961, 51 y.o.   MRN: [id number]  No chief complaint on file.   HPI Destiny Harrell is a 51 y.o. female.  Patient sent at the request of Dr.Arceo for left breast microcalcifications. 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 No history of breast mass, breast pain or nipple discharge. Mother had breast cancer and had mastectomy. No other first-degree relatives noted. Patient returns after MRI. HPI  Past Medical History  Diagnosis Date  . IBS (irritable bowel syndrome)     Past Surgical History  Procedure Laterality Date  . Cesarean section  1984, 1986, 1988  . Oophorectomy  Right 1999, Left 2001  . Cholecystectomy  2003    Family History  Problem Relation Age of Onset  . Cancer Mother     Breast Cancer  . Cancer Father     Hodgkin Lymphoma    Social History History  Substance Use Topics  . Smoking status: Former Smoker    Quit date: 08/21/2012  . Smokeless tobacco: Not on file  . Alcohol Use: Yes    No Known Allergies  No current outpatient prescriptions on file.   No current facility-administered medications for this visit.    Review of Systems Review of Systems  Constitutional: Negative for fever, chills and unexpected weight change.  HENT: Negative for hearing loss, congestion, sore throat, trouble swallowing and voice change.   Eyes: Negative for visual disturbance.  Respiratory: Negative for cough and wheezing.   Cardiovascular: Negative for chest pain, palpitations and leg swelling.  Gastrointestinal: Negative for nausea, vomiting, abdominal pain, diarrhea, constipation, blood in stool, abdominal distention and anal bleeding.  Genitourinary: Negative for hematuria, vaginal bleeding and difficulty urinating.  Musculoskeletal: Negative for arthralgias.  Skin: Negative for rash and wound.  Neurological: Negative for seizures, syncope and headaches.  Hematological: Negative for  adenopathy. Does not bruise/bleed easily.  Psychiatric/Behavioral: Negative for confusion.    Blood pressure 124/80, pulse 68, resp. rate 12, height 5' 2.5" (1.588 m), weight 160 lb 6.4 oz (72.757 kg).  Physical Exam Physical Exam  Constitutional: She is oriented to person, place, and time. She appears well-developed and well-nourished.  HENT:  Head: Normocephalic and atraumatic.  Eyes: EOM are normal. Pupils are equal, round, and reactive to light.  Neck: Normal range of motion. Neck supple.  Cardiovascular: Normal rate and regular rhythm.   Pulmonary/Chest: Effort normal and breath sounds normal. Right breast exhibits no inverted nipple, no mass, no nipple discharge, no skin change and no tenderness. Left breast exhibits mass. Left breast exhibits no inverted nipple, no nipple discharge, no skin change and no tenderness. Breasts are symmetrical.    Musculoskeletal: Normal range of motion.  Neurological: She is alert and oriented to person, place, and time.  Skin: Skin is warm and dry.  Psychiatric: She has a normal mood and affect. Her behavior is normal. Thought content normal.    Data Reviewed Clinical Data: The patient was called back for calcifications on  recent screening mammogram  DIGITAL DIAGNOSTIC LEFT MAMMOGRAM  Comparison: Mammograms dated 05/24/2012, 06/29/2010, 06/02/2009 and  04/22/2008  Findings:  ACR Breast Density Category 2: There is a scattered fibroglandular  pattern.  Magnification views of the central portion of the left breast  demonstrate developing pleomorphic calcifications following a  ductal pattern measuring 3.5 cm. There is no associated mass.  IMPRESSION:  Suspicious left breast calcifications. Tissue sampling is  recommended.  RECOMMENDATION:  Stereotactic biopsy of   the left breast will be scheduled at the  patient's convenience.  I have discussed the findings and recommendations with the patient.  Results were also provided in writing at  the conclusion of the  visit.  BI-RADS CATEGORY 4: Suspicious abnormality - biopsy should be  considered.  *RADIOLOGY REPORT*  Clinical Data: new diagnosis of dcis related to stereotactic biopsy  of calcifications central left breast  BILATERAL BREAST MRI WITH AND WITHOUT CONTRAST  Technique: Multiplanar, multisequence MR images of both breasts  were obtained prior to and following the intravenous administration  of 15ml of Multihance. Three dimensional images were evaluated at  the independent DynaCad workstation.  Comparison: mammograpy performed 05/24/12, 07/26/12, 08/16/12  Findings: There is mild background parenchymal enhancement. There  are no suspicious findings on the right.  On the left, there is a 2cm biopsy cavity associated with marker  clip in the central slightly superior breast. Extending anteriorly  and inferiorly from the clip is linear low-grade enhancement,  extending 3.5cm anterior to the clip/biopsy cavity. This is  distinctly separate from the biopsy tract, which extends directly  inferiorly to skin surface of the inferior breast.  There is no adenopathy or other suspicious finding.  IMPRESSION:  Suspicious linear enhancement extending 3.5cm anterior to the  biopsy site.  BI-RADS CATEGORY 6: Known biopsy-proven malignancy - appropriate  action should be taken.   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 Feel that lumpectomy as possible the situation I did cautioned against possible cosmetic deformity. I do not think it neoadjuvant chemotherapy would be helpful since most of this is linear enhancement consistent with DCIS with microinvasion. She would like to try breast conservation which I feel is reasonable. Or further medical oncology and genetics. She would like to proceed with surgical therapy. We'll arrange for left breast lumpectomy with sentinel lymph node  mapping.The procedure has been discussed with the patient. Alternatives to surgery have been discussed with the patient.  Risks of surgery include bleeding,  Infection,  Seroma formation, death,  and the need for further surgery.   The patient understands and wishes to proceed.Sentinel lymph node mapping and dissection has been discussed with the patient.  Risk of bleeding,  Infection,  Seroma formation,  Additional procedures,,  Shoulder weakness ,  Shoulder stiffness,  Nerve and blood vessel injury and reaction to the mapping dyes have been discussed.  Alternatives to surgery have been discussed with the patient.  The patient agrees to proceed.       CORNETT,THOMAS A. 08/28/2012, 11:06 AM    

## 2012-08-31 NOTE — H&P (View-Only) (Signed)
Patient ID: Destiny Harrell, female   DOB: 1961/01/28, 52 y.o.   MRN: [id number]  No chief complaint on file.   HPI Destiny Harrell is a 52 y.o. female.  Patient sent at the request of Dr.Arceo for left breast microcalcifications. 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 No history of breast mass, breast pain or nipple discharge. Mother had breast cancer and had mastectomy. No other first-degree relatives noted. HPI  Past Medical History  Diagnosis Date  . IBS (irritable bowel syndrome)     Past Surgical History  Procedure Laterality Date  . Cesarean section  1984, 1986, 1988  . Oophorectomy  Right 1999, Left 2001  . Cholecystectomy  2003    Family History  Problem Relation Age of Onset  . Cancer Mother     Breast Cancer  . Cancer Father     Hodgkin Lymphoma    Social History History  Substance Use Topics  . Smoking status: Current Every Day Smoker  . Smokeless tobacco: Not on file  . Alcohol Use: Yes    No Known Allergies  No current outpatient prescriptions on file.   No current facility-administered medications for this visit.    Review of Systems Review of Systems  Constitutional: Negative for fever, chills and unexpected weight change.  HENT: Negative for hearing loss, congestion, sore throat, trouble swallowing and voice change.   Eyes: Negative for visual disturbance.  Respiratory: Negative for cough and wheezing.   Cardiovascular: Negative for chest pain, palpitations and leg swelling.  Gastrointestinal: Negative for nausea, vomiting, abdominal pain, diarrhea, constipation, blood in stool, abdominal distention and anal bleeding.  Genitourinary: Negative for hematuria, vaginal bleeding and difficulty urinating.  Musculoskeletal: Negative for arthralgias.  Skin: Negative for rash and wound.  Neurological: Negative for seizures, syncope and headaches.  Hematological: Negative for adenopathy. Does not bruise/bleed easily.    Psychiatric/Behavioral: Negative for confusion.    Blood pressure 130/86, pulse 68, temperature 97.8 F (36.6 C), temperature source Temporal, resp. rate 16, height 5' 2.5" (1.588 m), weight 157 lb 9.6 oz (71.487 kg).  Physical Exam Physical Exam  Constitutional: She is oriented to person, place, and time. She appears well-developed and well-nourished.  HENT:  Head: Normocephalic and atraumatic.  Eyes: EOM are normal. Pupils are equal, round, and reactive to light.  Neck: Normal range of motion. Neck supple.  Cardiovascular: Normal rate and regular rhythm.   Pulmonary/Chest: Effort normal and breath sounds normal. Right breast exhibits no inverted nipple, no mass, no nipple discharge, no skin change and no tenderness. Left breast exhibits mass. Left breast exhibits no inverted nipple, no nipple discharge, no skin change and no tenderness. Breasts are symmetrical.    Musculoskeletal: Normal range of motion.  Neurological: She is alert and oriented to person, place, and time.  Skin: Skin is warm and dry.  Psychiatric: She has a normal mood and affect. Her behavior is normal. Thought content normal.    Data Reviewed Clinical Data: The patient was called back for calcifications on  recent screening mammogram  DIGITAL DIAGNOSTIC LEFT MAMMOGRAM  Comparison: Mammograms dated 05/24/2012, 06/29/2010, 06/02/2009 and  04/22/2008  Findings:  ACR Breast Density Category 2: There is a scattered fibroglandular  pattern.  Magnification views of the central portion of the left breast  demonstrate developing pleomorphic calcifications following a  ductal pattern measuring 3.5 cm. There is no associated mass.  IMPRESSION:  Suspicious left breast calcifications. Tissue sampling is  recommended.  RECOMMENDATION:  Stereotactic biopsy  of the left breast will be scheduled at the  patient's convenience.  I have discussed the findings and recommendations with the patient.  Results were also  provided in writing at the conclusion of the  visit.  BI-RADS CATEGORY 4: Suspicious abnormality - biopsy should be  considered.    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  Refer to medical oncology.  May need genetics as well.         CORNETT,THOMAS A. 08/22/2012, 11:06 AM

## 2012-08-31 NOTE — Anesthesia Postprocedure Evaluation (Signed)
  Anesthesia Post-op Note  Patient: Destiny Harrell  Procedure(s) Performed: Procedure(s) with comments: left BREAST needle localized  LUMPECTOMY WITH left sentinel lymph node mapping  (Left) - needle localization at breast center of [place] 7:30 nuclear medicine injection at 9:30 neoprobe    Patient Location: PACU  Anesthesia Type:General  Level of Consciousness: awake, alert  and oriented  Airway and Oxygen Therapy: Patient Spontanous Breathing and Patient connected to nasal cannula oxygen  Post-op Pain: mild  Post-op Assessment: Post-op Vital signs reviewed, Patient's Cardiovascular Status Stable, Respiratory Function Stable, Patent Airway and Pain level controlled  Post-op Vital Signs: stable  Complications: No apparent anesthesia complications

## 2012-08-31 NOTE — Interval H&P Note (Signed)
History and Physical Interval Note:  08/31/2012 9:28 AM  Destiny Harrell  has presented today for surgery, with the diagnosis of left breast cancer   The various methods of treatment have been discussed with the patient and family. After consideration of risks, benefits and other options for treatment, the patient has consented to  Procedure(s) with comments: left BREAST needle localized  LUMPECTOMY WITH left sentinel lymph node mapping  (Left) - needle localization at breast center of [place] 7:30 nuclear medicine injection at 9:30 neoprobe   as a surgical intervention .  The patient's history has been reviewed, patient examined, no change in status, stable for surgery.  I have reviewed the patient's chart and labs.  Questions were answered to the patient's satisfaction.     CORNETT,THOMAS A.   

## 2012-09-01 ENCOUNTER — Encounter (HOSPITAL_BASED_OUTPATIENT_CLINIC_OR_DEPARTMENT_OTHER): Payer: Self-pay | Admitting: Surgery

## 2012-09-05 ENCOUNTER — Telehealth: Payer: Self-pay | Admitting: *Deleted

## 2012-09-05 ENCOUNTER — Telehealth (INDEPENDENT_AMBULATORY_CARE_PROVIDER_SITE_OTHER): Payer: Self-pay | Admitting: *Deleted

## 2012-09-05 DIAGNOSIS — C50912 Malignant neoplasm of unspecified site of left female breast: Secondary | ICD-10-CM

## 2012-09-05 NOTE — Telephone Encounter (Signed)
Confirmed 09/15/12 appt w/ pt.  Emailed Clydie Braun in [facility] for an appt.  Emailed Music therapist at [facility] to make her aware.  Mailed before appt letter & packet to pt.  Took paperwork to [facility] for chart.

## 2012-09-05 NOTE — Telephone Encounter (Signed)
Patient called and was given path results.  Patient also asked about a referral for oncology.

## 2012-09-05 NOTE — Telephone Encounter (Signed)
I have sent referral over several times and staff messages and still have not heard anything. I will reach out once again to try and get this patient scheduled with med oncology.

## 2012-09-07 ENCOUNTER — Other Ambulatory Visit: Payer: Self-pay | Admitting: *Deleted

## 2012-09-07 DIAGNOSIS — C50912 Malignant neoplasm of unspecified site of left female breast: Secondary | ICD-10-CM | POA: Insufficient documentation

## 2012-09-07 DIAGNOSIS — C50112 Malignant neoplasm of central portion of left female breast: Secondary | ICD-10-CM

## 2012-09-12 ENCOUNTER — Ambulatory Visit (INDEPENDENT_AMBULATORY_CARE_PROVIDER_SITE_OTHER): Payer: 59 | Admitting: Surgery

## 2012-09-12 ENCOUNTER — Encounter (INDEPENDENT_AMBULATORY_CARE_PROVIDER_SITE_OTHER): Payer: Self-pay | Admitting: Surgery

## 2012-09-12 VITALS — BP 110/68 | HR 60 | Temp 97.4°F | Resp 16 | Ht 62.5 in | Wt 159.4 lb

## 2012-09-12 DIAGNOSIS — Z9889 Other specified postprocedural states: Secondary | ICD-10-CM

## 2012-09-12 NOTE — Progress Notes (Signed)
Patient returns for left breast lumpectomy and sentinel lymph node mapping for a stage I left breast cancer ER positive PR negative HER-2/neu negative. Margins negative. She's doing well.  Exam: Left breast lumpectomy incision healing well. No erythema or seroma. Left axilla clean dry intact without seroma.  Impression: Stage I left breast cancer status post breast conservation  Plan: Medical radiation oncology to sees this  Week.return one month.

## 2012-09-12 NOTE — Patient Instructions (Signed)
Return 1 month.  No restrictions

## 2012-09-13 ENCOUNTER — Ambulatory Visit: Payer: 59

## 2012-09-13 ENCOUNTER — Encounter: Payer: Self-pay | Admitting: Radiation Oncology

## 2012-09-13 ENCOUNTER — Ambulatory Visit: Payer: 59 | Admitting: Radiation Oncology

## 2012-09-14 ENCOUNTER — Encounter: Payer: Self-pay | Admitting: Radiation Oncology

## 2012-09-14 ENCOUNTER — Ambulatory Visit
Admission: RE | Admit: 2012-09-14 | Discharge: 2012-09-14 | Disposition: A | Discharge status: Home / Self Care | Payer: 59 | Source: Ambulatory Visit | Attending: Radiation Oncology | Admitting: Radiation Oncology

## 2012-09-14 VITALS — BP 122/93 | HR 67 | Temp 98.7°F | Wt 159.8 lb

## 2012-09-14 DIAGNOSIS — Z87891 Personal history of nicotine dependence: Secondary | ICD-10-CM | POA: Insufficient documentation

## 2012-09-14 DIAGNOSIS — C50112 Malignant neoplasm of central portion of left female breast: Secondary | ICD-10-CM

## 2012-09-14 DIAGNOSIS — Z170 Estrogen receptor positive status [ER+]: Secondary | ICD-10-CM | POA: Insufficient documentation

## 2012-09-14 DIAGNOSIS — C50919 Malignant neoplasm of unspecified site of unspecified female breast: Secondary | ICD-10-CM | POA: Insufficient documentation

## 2012-09-14 HISTORY — DX: Malignant neoplasm of unspecified site of unspecified female breast: C50.919

## 2012-09-14 NOTE — Progress Notes (Signed)
Radiation Oncology         [phone] ________________________________  Name: Destiny Harrell MRN: [id number]  Date: 09/14/2012  DOB: 02-13-1961  GE:XBMWUX,LKGMW W, MD  Cornett, Clovis Pu., MD     REFERRING PHYSICIAN: Luisa Hart Clovis Pu., MD   DIAGNOSIS: There were no encounter diagnoses.   HISTORY OF PRESENT ILLNESS::Destiny Harrell is a 52 y.o. female who is seen for an initial consultation visit. The patient has a recent diagnosis of left-sided breast cancer and she has completed a lumpectomy. She initially was found to have some microcalcifications on screening mammography. 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 Receptor studies indicated that the tumor was ER positive, PR negative, and HER-2/neu negative.  The patient proceeded to undergo an MRI scan of the breasts bilaterally. There was a suspicious area of linear enhancement extending 3.5 cm anterior to the biopsy site. A 2 cm biopsy cavity was noted with clip placed slightly superiorly.  The patient was felt to be a good candidate for breast conservation treatment and she proceeded to undergo a lumpectomy on 3/ 13/2014. This returned positive for invasive carcinoma measuring 1.8 cm. 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 The invasive carcinoma was 0.4 cm from the nearest margin, as was the in situ disease as well. The margins were therefore negative. Lymphovascular space invasion was absent. The invasive disease was grade 2. The majority of the carcinoma was in situ disease but there were areas of microscopic invasion. Therefore this was staged as a T26miN0 tumor, with both of the sampled lymph nodes negative for carcinoma.  The patient indicates that she is doing very well postoperatively. Her soreness has improved over the last couple of days. She is scheduled to see medical oncology tomorrow.   PREVIOUS  RADIATION THERAPY: No   PAST MEDICAL HISTORY:  has a past medical history of IBS (irritable bowel syndrome); Medical history non-contributory; and Breast cancer.     PAST SURGICAL HISTORY: Past Surgical History  Procedure Laterality Date  . Cesarean section  1984, 1986, 1988  . Oophorectomy  Right 1999, Left 2001  . Cholecystectomy  2010  . Breast lumpectomy with needle localization and axillary sentinel lymph node bx Left 08/31/2012    Procedure: left BREAST needle localized  LUMPECTOMY WITH left sentinel lymph node mapping ;  Surgeon: Clovis Pu. Cornett, MD;  Location: [facility];  Service: General;  Laterality: Left;  needle localization at breast center of [place] 7:30   . Breast surgery  08/31/12    ER+PR-HER-2neu-  . Breast biopsy  08/17/2012     FAMILY HISTORY: family history includes Cancer in her father and mother.   SOCIAL HISTORY:  reports that she quit smoking about 3 weeks ago. Her smoking use included Cigarettes. She smoked 0.00 packs per day. She does not have any smokeless tobacco history on file. She reports that  drinks alcohol. She reports that she does not use illicit drugs.   ALLERGIES: Review of patient's allergies indicates no known allergies.   MEDICATIONS:  Current Outpatient Prescriptions  Medication Sig Dispense Refill  . acetaminophen (TYLENOL) 500 MG tablet Take 500 mg by mouth every 6 (six) hours as needed for pain.      . Multiple Vitamins-Minerals (MULTIVITAMIN WITH MINERALS) tablet Take 1 tablet by mouth daily.      Marland Kitchen oxyCODONE-acetaminophen (PERCOCET/ROXICET) 5-325 MG per tablet  No current facility-administered medications for this encounter.     REVIEW OF SYSTEMS:  A 15 point review of systems is documented in the electronic medical record. This was obtained by the nursing staff. However, I reviewed this with the patient to discuss relevant findings and make appropriate changes.  Pertinent items are noted in HPI.    PHYSICAL  EXAM:  weight is 159 lb 12.8 oz (72.485 kg). Her temperature is 98.7 F (37.1 C). Her blood pressure is 122/93 and her pulse is 67. Her oxygen saturation is 99%.   General: Well-developed, in no acute distress HEENT: Normocephalic, atraumatic; oral cavity clear Neck: Supple without any lymphadenopathy Cardiovascular: Regular rate and rhythm Respiratory: Clear to auscultation bilaterally Breasts: Patient is status post lumpectomy with a well healing surgical incision present medially to the nipple area or complex. Postsurgical changes present in this area without additional lesions. Well healing axillary scar present without any palpable axillary lymphadenopathy. The right breast exam and examination of the right axilla was unremarkable. GI: Soft, nontender, normal bowel sounds Extremities: No edema present Neuro: No focal deficits     LABORATORY DATA:  Lab Results  Component Value Date   HGB 14.2 08/31/2012   No results found for this basename: NA, K, CL, CO2   No results found for this basename: ALT, AST, GGT, ALKPHOS, BILITOT      RADIOGRAPHY: Mr Breast Bilateral W Wo Contrast  08/23/2012  *RADIOLOGY REPORT*  Clinical Data: new diagnosis of dcis related to stereotactic biopsy of calcifications central left breast  BILATERAL BREAST MRI WITH AND WITHOUT CONTRAST  Technique: Multiplanar, multisequence MR images of both breasts were obtained prior to and following the intravenous administration of 15ml of Multihance.  Three dimensional images were evaluated at the independent DynaCad workstation.  Comparison:  mammograpy performed 05/24/12, 07/26/12, 08/16/12  Findings: There is mild background parenchymal enhancement.  There are no suspicious findings on the right.  On the left, there is a 2cm biopsy cavity associated with marker clip in the central slightly superior breast.  Extending anteriorly and inferiorly from the clip is linear low-grade enhancement, extending 3.5cm anterior to the  clip/biopsy cavity.  This is distinctly separate from the biopsy tract, which extends directly inferiorly to skin surface of the inferior breast.  There is no adenopathy or other suspicious finding.  IMPRESSION: Suspicious linear enhancement extending 3.5cm anterior to the biopsy site.  BI-RADS CATEGORY 6:  Known biopsy-proven malignancy - appropriate action should be taken.  RECOMMENDATION: If breast conservation is being planned, MRI-guided biopsy of the anterior aspect of the suspicious linear enhancement in the left breast is recommended to document extent of disease.  THREE-DIMENSIONAL MR IMAGE RENDERING ON INDEPENDENT WORKSTATION:  Three-dimensional MR images were rendered by post-processing of the original MR data on an independent workstation.  The three- dimensional MR images were interpreted, and findings were reported in the accompanying complete MRI report for this study.   Original Report Authenticated By: Esperanza Heir, M.D.    Nm Sentinel Node Inj-no Rpt (breast)  08/31/2012  CLINICAL DATA: breast cancer   Sulfur colloid was injected intradermally by the nuclear medicine  technologist for breast cancer sentinel node localization.     Mm Digital Diag Ltd L  08/15/2012  *RADIOLOGY REPORT*  Clinical Data:  The patient was called back for calcifications on recent screening mammogram  DIGITAL DIAGNOSTIC LEFT MAMMOGRAM  Comparison: Mammograms dated 05/24/2012, 06/29/2010, 06/02/2009 and 04/22/2008  Findings:  ACR Breast Density Category 2: There is a scattered  fibroglandular pattern.  Magnification views of the central portion of the left breast demonstrate developing pleomorphic calcifications following a ductal pattern measuring 3.5 cm.  There is no associated mass.  IMPRESSION: Suspicious left breast calcifications.  Tissue sampling is recommended.  RECOMMENDATION: Stereotactic biopsy of the left breast will be scheduled at the patient's convenience.  I have discussed the findings and  recommendations with the patient. Results were also provided in writing at the conclusion of the visit.  BI-RADS CATEGORY 4:  Suspicious abnormality - biopsy should be considered.   Original Report Authenticated By: Baird Lyons, M.D.    Mm Radiologist Eval And Mgmt  08/17/2012  *RADIOLOGY REPORT*  ESTABLISHED PATIENT OFFICE VISIT - LEVEL II [phone])  Chief Complaint:  Follow up after stereotactic guided needle biopsy of the left breast.  The patient denies significant pain at the biopsy site.  History:  Suspicious calcifications in the left breast for which the patient underwent stereotactic guided needle biopsy yesterday.  Exam:  Incision in the inferior left breast is clean and dry.  No palpable firmness or fluctuance to suggest a hematoma.  Minimal bruising at the biopsy site.  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  This is concordant with imaging findings.  Assessment and Plan:  The patient has been scheduled to see Dr. Luisa Hart of [facility] on Tuesday, March 4, at 9:40 a.m.  She has been scheduled for bilateral breast MRI at [facility] on Wednesday, March 5, at 8 o'clock a.m.   Original Report Authenticated By: Hulan Saas, M.D.    Mm Lt Breast Bx W Loc Dev 1st Lesion Image Bx Spec Stereo Guide  08/16/2012  *RADIOLOGY REPORT*  Clinical Data:  Suspicious left breast calcifications  STEREOTACTIC-GUIDED VACUUM ASSISTED BIOPSY OF THE LEFT BREAST AND SPECIMEN RADIOGRAPH  Comparison: Previous exams.  I met with the patient and we discussed the procedure of stereotactic-guided biopsy, including benefits and alternatives. We discussed the high likelihood of a successful procedure. We discussed the risks of the procedure, including infection, bleeding, tissue injury, clip migration, and inadequate sampling. Informed, written consent was given.  Using sterile technique, 2% lidocaine, stereotactic guidance, and a 9 gauge vacuum assisted device, biopsy was  performed of calcifications in the central portion of the left breast using an inferior approach.  Specimen radiograph was performed, showing calcifications are present the tissue samples.  Specimens with calcifications are identified for pathology.  At the conclusion of the procedure, a T-shaped tissue marker clip was deployed into the biopsy cavity.  Follow-up 2-view mammogram confirmed clip placement.  IMPRESSION: Stereotactic-guided biopsy of the left breast.  No apparent complications.   Original Report Authenticated By: Baird Lyons, M.D.    Mm Lt Plc Breast Loc Dev   1st Lesion  Inc Mammo Guide  08/31/2012  *RADIOLOGY REPORT*  Clinical Data:  The patient presents for needle localization prior to lumpectomy. Known left breast cancer following stereotactic guided core biopsy.  NEEDLE LOCALIZATION WITH MAMMOGRAPHIC GUIDANCE AND SPECIMEN RADIOGRAPH  Comparison:  Previous exams.  Patient presents for needle localization prior to lumpectomy.  I met with the patient and we discussed the procedure of needle localization including benefits and alternatives. We discussed the high likelihood of a successful procedure. We discussed the risks of the procedure, including infection, bleeding, tissue injury, and further surgery. Informed, written consent was given.  Using mammographic guidance, sterile technique, 2% lidocaine and 2- 7 cm modified Kopans needle, the calcifications in the lower inner quadrant of the left  breast are localized using a medial approach. The films are marked for Dr. Luisa Hart.  [facility], and confirms calcifications, clip, and wires to be present in the tissue sample.  The specimen was marked for pathology.  IMPRESSION: Needle localization of the left breast.  No apparent complications.   Original Report Authenticated By: Norva Pavlov, M.D.        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 The tumor is ER positive, PR negative, and HER-2/neu negative. She is healing well. She sees medical oncology tomorrow.   PLAN: The patient is appropriate to proceed with adjuvant radiotherapy postoperatively at the appropriate time. I discussed with the patient a 6-1/2 week course of whole breast radiotherapy.  We discussed the rationale of such a treatment to improve local control. We also discussed in detail the potential side effects and risks of treatment. All of her questions were answered.  We discussed tentatively scheduling the patient for a simulation and we will gain for having this completed next week. The patient is healing well and I would anticipate beginning her treatment within the next 2-3 weeks. The patient knows to contact our office to let us know if there are any plans for chemotherapy and we will adjust her schedule accordingly.  The patient was given contact information and encourage to call our office if we can be of any further assistance. Our staff will be calling her to schedule the simulation.    I spent 60 minutes minutes face to face with the patient and more than 50% of that time was spent in counseling and/or coordination of care.    ________________________________   Radene Gunning, MD, PhD

## 2012-09-14 NOTE — Progress Notes (Signed)
Patient and daughter here for radiation consultation of newly diagnosed stage 1 left breast cancer  ER+ PR with 0%positivity HER 2 neu no amplification Patient has been relatively healthy.History of mild depression. Scheduled for consultation with Dr.Khan on 09/15/12.

## 2012-09-14 NOTE — Progress Notes (Signed)
Please see the Nurse Progress Note in the MD Initial Consult Encounter for this patient. 

## 2012-09-14 NOTE — Addendum Note (Signed)
Encounter addended by: Glennie Hawk, RN on: 09/14/2012  8:37 AM<BR>     Documentation filed: Chief Complaint Section

## 2012-09-15 ENCOUNTER — Ambulatory Visit: Payer: 59

## 2012-09-15 ENCOUNTER — Ambulatory Visit (HOSPITAL_BASED_OUTPATIENT_CLINIC_OR_DEPARTMENT_OTHER): Payer: 59 | Admitting: Oncology

## 2012-09-15 ENCOUNTER — Other Ambulatory Visit (HOSPITAL_BASED_OUTPATIENT_CLINIC_OR_DEPARTMENT_OTHER): Payer: 59 | Admitting: Lab

## 2012-09-15 ENCOUNTER — Encounter: Payer: Self-pay | Admitting: Oncology

## 2012-09-15 ENCOUNTER — Telehealth: Payer: Self-pay | Admitting: Oncology

## 2012-09-15 VITALS — BP 108/73 | HR 76 | Temp 98.1°F | Resp 20 | Ht 62.0 in | Wt 158.4 lb

## 2012-09-15 DIAGNOSIS — Z170 Estrogen receptor positive status [ER+]: Secondary | ICD-10-CM

## 2012-09-15 DIAGNOSIS — C50112 Malignant neoplasm of central portion of left female breast: Secondary | ICD-10-CM

## 2012-09-15 DIAGNOSIS — C50919 Malignant neoplasm of unspecified site of unspecified female breast: Secondary | ICD-10-CM

## 2012-09-15 DIAGNOSIS — C50119 Malignant neoplasm of central portion of unspecified female breast: Secondary | ICD-10-CM

## 2012-09-15 LAB — COMPREHENSIVE METABOLIC PANEL (CC13)
Albumin: 3.6 g/dL (ref 3.5–5.0)
BUN: 16 mg/dL (ref 7.0–26.0)
CO2: 27 mEq/L (ref 22–29)
Calcium: 9.6 mg/dL (ref 8.4–10.4)
Chloride: 106 mEq/L (ref 98–107)
Creatinine: 0.9 mg/dL (ref 0.6–1.1)

## 2012-09-15 LAB — CBC WITH DIFFERENTIAL/PLATELET
Basophils Absolute: 0.1 10*3/uL (ref 0.0–0.1)
Eosinophils Absolute: 0.2 10*3/uL (ref 0.0–0.5)
HCT: 40.8 % (ref 34.8–46.6)
HGB: 13.8 g/dL (ref 11.6–15.9)
MONO#: 0.6 10*3/uL (ref 0.1–0.9)
NEUT#: 5.7 10*3/uL (ref 1.5–6.5)
NEUT%: 59 % (ref 38.4–76.8)
WBC: 9.7 10*3/uL (ref 3.9–10.3)
lymph#: 3.1 10*3/uL (ref 0.9–3.3)

## 2012-09-15 LAB — CANCER ANTIGEN 27.29: CA 27.29: 27 U/mL (ref 0–39)

## 2012-09-15 NOTE — Telephone Encounter (Signed)
gv pt appt schedule for June.  °

## 2012-09-15 NOTE — Progress Notes (Signed)
Destiny Harrell [id number] 01/08/1961 52 y.o. 09/15/2012 3:03 PM  CC  Sissy Hoff, MD [address] [place] [address] Dr. Dorothy Puffer Dr. 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 Patient is seen in medical oncology for discussion of treatment options.  STAGE:  Left Breast TmicN0 ER+PR- Her2- Clinical stage I  REFERRING PHYSICIAN: Dr. Harriette Bouillon.  HISTORY OF PRESENT ILLNESS:  Destiny Harrell is a 52 y.o. female.  who is seen for an initial consultation visit. The patient has a recent diagnosis of left-sided breast cancer and she has completed a lumpectomy. She initially was found to have some microcalcifications on screening mammography. 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 Receptor studies indicated that the tumor was ER positive, PR negative, and HER-2/neu negative.  The patient proceeded to undergo an MRI scan of the breasts bilaterally. There was a suspicious area of linear enhancement extending 3.5 cm anterior to the biopsy site. A 2 cm biopsy cavity was noted with clip placed slightly superiorly.  The patient was felt to be a good candidate for breast conservation treatment and she proceeded to undergo a lumpectomy on 3/ 13/2014. This returned positive for invasive carcinoma measuring 1.8 cm. 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 The invasive carcinoma was 0.4 cm from the nearest margin, as was the in situ disease as well. The margins were therefore negative. Lymphovascular space invasion was absent. The invasive disease was grade 2. The majority of the carcinoma was in situ disease but there were areas of microscopic invasion. Therefore this was staged as a T40miN0 tumor, with both of the sampled lymph nodes negative for carcinoma.  The  patient indicates that she is doing very well postoperatively. Her soreness has improved over the last couple of days. She was seen by radiation oncology on 09/14/2012.   Past Medical History: Past Medical History  Diagnosis Date  . IBS (irritable bowel syndrome)   . Medical history non-contributory   . Breast cancer     Past Surgical History: Past Surgical History  Procedure Laterality Date  . Cesarean section  1984, 1986, 1988  . Oophorectomy  Right 1999, Left 2001  . Cholecystectomy  2010  . Breast lumpectomy with needle localization and axillary sentinel lymph node bx Left 08/31/2012    Procedure: left BREAST needle localized  LUMPECTOMY WITH left sentinel lymph node mapping ;  Surgeon: Clovis Pu. Cornett, MD;  Location: [facility];  Service: General;  Laterality: Left;  needle localization at breast center of [place] 7:30   . Breast surgery  08/31/12    ER+PR-HER-2neu-  . Breast biopsy  08/17/2012    Family History: Family History  Problem Relation Age of Onset  . Cancer Mother     Breast Cancer  . Cancer Father     Hodgkin Lymphoma    Social History History  Substance Use Topics  . Smoking status: Former Smoker    Types: Cigarettes    Quit date: 08/21/2012  . Smokeless tobacco: Not on file     Comment: 4 cigarettes per day   . Alcohol Use: Yes     Comment: rare    Allergies: No Known Allergies  Current Medications: Current Outpatient Prescriptions  Medication Sig Dispense Refill  . acetaminophen (TYLENOL) 500 MG tablet Take 500  mg by mouth every 6 (six) hours as needed for pain.      . Multiple Vitamins-Minerals (MULTIVITAMIN WITH MINERALS) tablet Take 1 tablet by mouth daily.      Marland Kitchen oxyCODONE-acetaminophen (PERCOCET/ROXICET) 5-325 MG per tablet        No current facility-administered medications for this visit.    OB/GYN History:patient had menarche at age 53 she is under gone menopause in 2001. She has been on hormone replacement therapy for 4  years. She's had 3 life term births first live birth was at 43.  Fertility Discussion: n/a Prior History of Cancer: none  Health Maintenance:  Colonoscopyyes  Bone Density yes Last PAP smear yes  ECOG PERFORMANCE STATUS: 0 - Asymptomatic  Genetic Counseling/testing: no  REVIEW OF SYSTEMS:  Comprehensive review of systems is scanned separately  PHYSICAL EXAMINATION: Blood pressure 108/73, pulse 76, temperature 98.1 F (36.7 C), temperature source Oral, resp. rate 20, height 5\' 2"  (1.575 m), weight 158 lb 6.4 oz (71.85 kg). Patient is well-developed well-nourished female in no acute distress. HEENT exam EOMI PERRLA sclerae anicteric no conjunctival pallor oral mucosa is moist neck is supple lungs are clear bilaterally to auscultation cardiovascular is regular rate rhythm abdomen is soft nontender nondistended bowel sounds are present no HSM extremities no edema neuro patient's alert oriented otherwise nonfocal. Left breast: Reveals well-healed surgical scar no masses nipple discharge. Right breast no masses or nipple discharge.    STUDIES/RESULTS: Mr Breast Bilateral W Wo Contrast  08/23/2012  *RADIOLOGY REPORT*  Clinical Data: new diagnosis of dcis related to stereotactic biopsy of calcifications central left breast  BILATERAL BREAST MRI WITH AND WITHOUT CONTRAST  Technique: Multiplanar, multisequence MR images of both breasts were obtained prior to and following the intravenous administration of 15ml of Multihance.  Three dimensional images were evaluated at the independent DynaCad workstation.  Comparison:  mammograpy performed 05/24/12, 07/26/12, 08/16/12  Findings: There is mild background parenchymal enhancement.  There are no suspicious findings on the right.  On the left, there is a 2cm biopsy cavity associated with marker clip in the central slightly superior breast.  Extending anteriorly and inferiorly from the clip is linear low-grade enhancement, extending 3.5cm anterior to the  clip/biopsy cavity.  This is distinctly separate from the biopsy tract, which extends directly inferiorly to skin surface of the inferior breast.  There is no adenopathy or other suspicious finding.  IMPRESSION: Suspicious linear enhancement extending 3.5cm anterior to the biopsy site.  BI-RADS CATEGORY 6:  Known biopsy-proven malignancy - appropriate action should be taken.  RECOMMENDATION: If breast conservation is being planned, MRI-guided biopsy of the anterior aspect of the suspicious linear enhancement in the left breast is recommended to document extent of disease.  THREE-DIMENSIONAL MR IMAGE RENDERING ON INDEPENDENT WORKSTATION:  Three-dimensional MR images were rendered by post-processing of the original MR data on an independent workstation.  The three- dimensional MR images were interpreted, and findings were reported in the accompanying complete MRI report for this study.   Original Report Authenticated By: Esperanza Heir, M.D.    Nm Sentinel Node Inj-no Rpt (breast)  08/31/2012  CLINICAL DATA: breast cancer   Sulfur colloid was injected intradermally by the nuclear medicine  technologist for breast cancer sentinel node localization.     Mm Radiologist Eval And Mgmt  08/17/2012  *RADIOLOGY REPORT*  ESTABLISHED PATIENT OFFICE VISIT - LEVEL II [phone])  Chief Complaint:  Follow up after stereotactic guided needle biopsy of the left breast.  The patient denies significant pain  at the biopsy site.  History:  Suspicious calcifications in the left breast for which the patient underwent stereotactic guided needle biopsy yesterday.  Exam:  Incision in the inferior left breast is clean and dry.  No palpable firmness or fluctuance to suggest a hematoma.  Minimal bruising at the biopsy site.  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  This is concordant with imaging findings.  Assessment and Plan:  The patient has been scheduled to see Dr. Luisa Hart of [facility] on  Tuesday, March 4, at 9:40 a.m.  She has been scheduled for bilateral breast MRI at [facility] on Wednesday, March 5, at 8 o'clock a.m.   Original Report Authenticated By: Hulan Saas, M.D.    Mm Lt Plc Breast Loc Dev   1st Lesion  Inc Mammo Guide  08/31/2012  *RADIOLOGY REPORT*  Clinical Data:  The patient presents for needle localization prior to lumpectomy. Known left breast cancer following stereotactic guided core biopsy.  NEEDLE LOCALIZATION WITH MAMMOGRAPHIC GUIDANCE AND SPECIMEN RADIOGRAPH  Comparison:  Previous exams.  Patient presents for needle localization prior to lumpectomy.  I met with the patient and we discussed the procedure of needle localization including benefits and alternatives. We discussed the high likelihood of a successful procedure. We discussed the risks of the procedure, including infection, bleeding, tissue injury, and further surgery. Informed, written consent was given.  Using mammographic guidance, sterile technique, 2% lidocaine and 2- 7 cm modified Kopans needle, the calcifications in the lower inner quadrant of the left breast are localized using a medial approach. The films are marked for Dr. Luisa Hart.  [facility], and confirms calcifications, clip, and wires to be present in the tissue sample.  The specimen is marked for pathology.  IMPRESSION: Needle localization of the left breast.  No apparent complications.   Original Report Authenticated By: Norva Pavlov, M.D.      LABS:    Chemistry   No results found for this basename: NA, K, CL, CO2, BUN, CREATININE, GLU   No results found for this basename: CALCIUM, ALKPHOS, AST, ALT, BILITOT      Lab Results  Component Value Date   WBC 9.7 09/15/2012   HGB 13.8 09/15/2012   HCT 40.8 09/15/2012   MCV 92.7 09/15/2012   PLT 351 09/15/2012   PATHOLOGY: REASON FOR ADDENDUM, AMENDMENT OR CORRECTION: [id number]: Clarification of size of invasive tumor.  09/15/12 11:58:44 AM (gt) FINAL DIAGNOSIS Diagnosis 1. Breast, lumpectomy, Left - INVASIVE DUCTAL CARCINOMA, SEE COMMENT. - INVASIVE CARCINOMA IS 0.4 CM FROM NEAREST MARGIN (INFERIOR). - DUCTAL CARCINOMA IN SITU WITH COMEDO NECROSIS AND NECROSIS AND CALCIFICATIONS. - SEE TUMOR TEMPLATE BELOW. 2. Lymph node, sentinel, biopsy, Left axilla - ONE LYMPH NODE, NEGATIVE FOR TUMOR (0/1). 3. Lymph node, sentinel, biopsy, Left axilla - ONE LYMPH NODE, NEGATIVE FOR TUMOR (0/1). Microscopic Comment 1. BREAST, INVASIVE TUMOR, WITH LYMPH NODE SAMPLING Specimen, including laterality: Left breast Procedure: Lumpectomy Grade: II of III see comment Tubule formation: III Nuclear pleomorphism: II Mitotic:I Tumor size (gross measurement): See comment Margins: Invasive, distance to closest margin: 0.4 cm In-situ, distance to closest margin: 0.4 cm (inferior) If margin positive, focally or broadly: N/A Lymphovascular invasion: Absent Ductal carcinoma in situ: Present Grade: II - III Extensive intraductal component: Present Lobular neoplasia: Absent Tumor focality: Unifocal 1 of 3 Amended copy Amended FINAL for Maddock, Destiny Harrell [phone]) Microscopic Comment(continued) Treatment effect: None If present, treatment effect in breast tissue, lymph nodes or  both: N/A Extent of tumor: Skin: Microscopically negative Nipple: N/A Skeletal muscle: N/A Lymph nodes: # examined: 2 Lymph nodes with metastasis: 0 Breast prognostic profile: Estrogen receptor: Not repeated, previous study demonstrated 90% positivity ([id number]) Progesterone receptor: Not repeated, previous study demonstrated 0% positivity ([id number]) Her 2 neu: Repeated, previous study demonstrated no amplification (1.19) ([id number]) Ki-67: Not repeated, previous study demonstrated 20% proliferation rate ([id number]) Non-neoplastic breast: Previous biopsy site and microcalcifications TNM: pT101mi, pN0, pMX see comment (CRR:caf  09/01/12) Comment: Although the majority of the carcinoma present is in situ, there are areas of microscopic invasion identified (pT50mi). These areas are confirmed by the absence of the myoepithelial layer (negative Calponin, p63 and smooth muscle mycin heavy chain immunostaining). Histologic grading is limited by the size of the invasive tumor. (CRR:caf 09/04/12) AMENDMENT: Although there is microscopic invasive adenocarcinoma present spanning the 1.8 cm biopsy cavity, the invasive tumor is best staged as pT10mi. This was discussed with Dr. Luisa Hart on 09/15/12. (MM:gt, 09/15/12) [place] RUND DO Pathologist, Electronic Signature (Case signed 09/15/2012) Specimen Gross and Clinical Information Specimen(s) Obtained: 1. Breast, lumpectomy, Left 2. Lymph node, sentinel, biopsy, Left axilla 3. Lymph node, sentinel, biopsy, Left axilla Specimen Clinical Information 1. Left breast cancer (tl) Gross 1. Specimen type: Received fresh and placed in formalin at 12:20 p.m. on 08/31/12 Size: 9.4 cm anterior to posterior x 5.7 cm medial to lateral x 2.6 cm superior to inferior, with a 2.7 x 0.7 cm ellipse of tan skin on the anterior surface. Orientation: The specimen is received inked as follows: anterior - green, inferior - blue, lateral - orange, medial - yellow, posterior - black, superior - red. Localized area: There are two guiding wires inserted through the anterior skin, and two pins inserted in the inferior aspect of the specimen, 6.0 cm posterior to the skin. Cut surface: Yellow red adipose tissue with tan pink fibrous tissue (approximately 10% fibrous). In areas designated by the pins there is a 1.8 x 1.6 x 1.1 cm yellow red, firm, hemorrhagic lesion. A T-shaped sliver metallic clip is identified adjacent to the lesion. Margins: The margins are inked as previously stated. The hemorrhagic lesion is 0.4 cm from the inferior margin, 0.6 cm from the superior margin, 1.4 cm from the lateral margin and 3.5  cm from the medial-posterior margin. Prognostic indicators: Obtained from paraffin blocks if needed. 2 of 3 Amended copy Amended FINAL for [place], [place] [phone]) Gross(continued) Block summary: Ten blocks submitted A = anterior margin, perpendicular B-H = area of hemorrhagic lesion I = medial margin J = posterior margin, perpendicular. 2. Received fresh labeled sentinel node  ASSESSMENT    52 year old female with  #1 new diagnosis of invasive ductal carcinoma of the left breast status post lumpectomy with sentinel lymph node biopsy. The final pathology revealed microscopic invasive disease which was node negative. Her tumor was ER +90% PR negative HER-2/neu negative. Postoperatively she is doing well. She has been seen by radiation oncology and they are planning on starting her on adjuvant radiation therapy.  #2 since patient's tumor is ER positive the recommendation would be antiestrogen therapy consisting of aromatase inhibitor such as Aromasin since patient is postmenopausal. We discussed the rationale risks benefits of this treatment. She understands that she would be receiving 5 years of an aromatase inhibitor.  #3 she certainly will need ongoing surveillance of her bone density as well as cholesterol and liver functions. We discussed this in detail.  #4 of her adjuvant antiestrogen therapy will begin after she completes  radiation therapy.     PLAN:    #1 proceed with radiation therapy first.  #2 I will see her back in about 3 months time for followup.        Thank you so much for allowing me to participate in the care of Summerlynn Glauser. I will continue to follow up the patient with you and assist in her care.  All questions were answered. The patient knows to call the clinic with any problems, questions or concerns. We can certainly see the patient much sooner if necessary.  I spent 60 minutes counseling the patient face to face. The total time spent in the  appointment was 60 minutes.   Drue Second, MD Medical/Oncology [facility] [phone] (beeper) [phone] (Office)  09/15/2012, 3:03 PM

## 2012-09-15 NOTE — Patient Instructions (Addendum)
Proceed with radiation   We discussed aromasin after radiation to help prevent the cancer returning and prevent new breast cancers  I will see you back in 3 months

## 2012-09-18 NOTE — Addendum Note (Signed)
Encounter addended by: Janecia Palau Mintz Jacques Fife, RN on: 09/18/2012  1:49 PM<BR>     Documentation filed: Charges VN

## 2012-09-18 NOTE — Addendum Note (Signed)
Encounter addended by: Delynn Flavin, RN on: 09/18/2012  1:50 PM<BR>     Documentation filed: Charges VN

## 2012-09-18 NOTE — Addendum Note (Signed)
Encounter addended by: Delynn Flavin, RN on: 09/18/2012  1:48 PM<BR>     Documentation filed: Charges VN

## 2012-09-20 ENCOUNTER — Telehealth: Payer: Self-pay | Admitting: Radiation Oncology

## 2012-09-20 ENCOUNTER — Ambulatory Visit
Admission: RE | Admit: 2012-09-20 | Discharge: 2012-09-20 | Disposition: A | Discharge status: Home / Self Care | Payer: 59 | Source: Ambulatory Visit | Attending: Radiation Oncology | Admitting: Radiation Oncology

## 2012-09-20 DIAGNOSIS — R5381 Other malaise: Secondary | ICD-10-CM | POA: Insufficient documentation

## 2012-09-20 DIAGNOSIS — C50119 Malignant neoplasm of central portion of unspecified female breast: Secondary | ICD-10-CM | POA: Insufficient documentation

## 2012-09-20 DIAGNOSIS — L589 Radiodermatitis, unspecified: Secondary | ICD-10-CM | POA: Insufficient documentation

## 2012-09-20 DIAGNOSIS — C50112 Malignant neoplasm of central portion of left female breast: Secondary | ICD-10-CM

## 2012-09-20 DIAGNOSIS — Z510 Encounter for antineoplastic radiation therapy: Secondary | ICD-10-CM | POA: Insufficient documentation

## 2012-09-20 NOTE — Progress Notes (Signed)
error 

## 2012-09-20 NOTE — Telephone Encounter (Signed)
Met w patient to discuss RO billing. Pt had no financial concerns today. However, does have an [employer] policy.    Dx: Breast cancer, left - Primary 174.9  Attending Rad:  JM  Rad Tx: Daily

## 2012-09-22 NOTE — Progress Notes (Signed)
  Radiation Oncology         [phone]) [phone] ________________________________  Name: Destiny Harrell MRN: [id number]  Date: 09/20/2012  DOB: 12/12/1960   SIMULATION AND TREATMENT PLANNING NOTE  The patient presented for simulation prior to beginning her course of radiation treatment for her diagnosis of left-sided breast cancer. The patient was placed in a supine position on a breast board. A customized accuform device was also constructed, and this complex treatment device will be used on a daily basis during her treatment. In this fashion, a CT scan was obtained through the chest area and an isocenter was placed near the chest wall within the left breast. Both a free breathing scan and a breath-hold scan were both obtained to determine which technique was optimal, and to see if the breath-hold technique markedly improved the anticipated dose to the heart. This was the case. Therefore we will use this technique.  The patient will be planned to receive a course of radiation initially to a dose of 50.4 gray. This will consist of a whole breast radiotherapy technique. To accomplish this, 2 customized blocks have been designed which will correspond to medial and lateral whole breast tangent fields. This treatment will be accomplished at 1.8 gray per fraction. A complex isodose plan is requested to ensure that the breast target area is adequately covered dosimetrically. A forward planning technique will also be evaluated to determine if this approach improves the plan. It is anticipated that the patient will then receive a 10 gray boost to the seroma cavity which has been contoured. This will be accomplished at 2 gray per fraction. The final anticipated total dose therefore will correspond to 60.4 gray.  This initial treatment will consist of a 3-D conformal technique. The seroma has been contoured as the primary target structure. Additionally, dose volume histograms of both this target as well as the lungs and heart  will also be evaluated. Such an approach is necessary to ensure that the target area is adequately covered while the nearby critical normal structures are adequately spared.   _______________________________   Radene Gunning, MD, PhD

## 2012-09-26 ENCOUNTER — Encounter: Payer: Self-pay | Admitting: Radiation Oncology

## 2012-09-26 NOTE — Progress Notes (Signed)
3-D simulation note: The patient underwent 3-D simulation today for treatment to her left breast with deep inspiration/breath-hold. She is setup to tangential fields. 2 sets of multileaf collimators were employed to conform the field. Dose volume histograms were obtained for the target structures and also avoidance structures including the lungs and heart. We met our departmental guidelines. Dr. Mitzi Hansen is prescribing 5040 cGy in 28 sessions utilizing 6 MV photons. She is being treated with deep inspiration and breath-hold which requires motion management [phone]). Please see separate note.

## 2012-09-26 NOTE — Progress Notes (Signed)
  Radiation Oncology         [phone] ________________________________  Name: Devoiry Corriher MRN: [id number]  Date: 09/26/2012  DOB: [birth year]  RESPIRATORY MOTION MANAGEMENT SIMULATION  NARRATIVE:  In order to account for effect of respiratory motion on target structures and other organs in the planning and delivery of radiotherapy, this patient underwent respiratory motion management simulation. She was simulated with deep inspiration/breath-hold to alter the heart location. Dose volume histograms were obtained and reviewed. The deep inspiration breath-hold CT data set was used to the free breathing data set for analysis.

## 2012-09-27 ENCOUNTER — Ambulatory Visit
Admission: RE | Admit: 2012-09-27 | Discharge: 2012-09-27 | Disposition: A | Discharge status: Home / Self Care | Payer: 59 | Source: Ambulatory Visit | Attending: Radiation Oncology | Admitting: Radiation Oncology

## 2012-09-27 DIAGNOSIS — C50112 Malignant neoplasm of central portion of left female breast: Secondary | ICD-10-CM

## 2012-09-27 NOTE — Progress Notes (Signed)
  Radiation Oncology         [phone]) [phone] ________________________________  Name: Destiny Harrell MRN: [id number]  Date: 09/27/2012  DOB: August 16, 1960  Simulation Verification Note   NARRATIVE: The patient was brought to the treatment unit and placed in the planned treatment position. The clinical setup was verified. Then port films were obtained and uploaded to the radiation oncology medical record software.  The treatment beams were carefully compared against the planned radiation fields. The position, location, and shape of the radiation fields was reviewed. The targeted volume of tissue appears to be appropriately covered by the radiation beams. Based on my personal review, I approved the simulation verification. The patient's treatment will proceed as planned.  ________________________________   Radene Gunning, MD, PhD

## 2012-09-28 ENCOUNTER — Ambulatory Visit
Admission: RE | Admit: 2012-09-28 | Discharge: 2012-09-28 | Disposition: A | Discharge status: Home / Self Care | Payer: 59 | Source: Ambulatory Visit | Attending: Radiation Oncology | Admitting: Radiation Oncology

## 2012-09-29 ENCOUNTER — Ambulatory Visit
Admission: RE | Admit: 2012-09-29 | Discharge: 2012-09-29 | Disposition: A | Discharge status: Home / Self Care | Payer: 59 | Source: Ambulatory Visit | Attending: Radiation Oncology | Admitting: Radiation Oncology

## 2012-09-29 VITALS — BP 121/85 | HR 73 | Temp 98.5°F | Wt 157.5 lb

## 2012-09-29 DIAGNOSIS — C50112 Malignant neoplasm of central portion of left female breast: Secondary | ICD-10-CM

## 2012-09-29 MED ORDER — ALRA NON-METALLIC DEODORANT (RAD-ONC)
1.0000 "application " | Freq: Once | TOPICAL | Status: AC
  Administered 2012-09-29: 1 via TOPICAL

## 2012-09-29 MED ORDER — RADIAPLEXRX EX GEL
Freq: Once | CUTANEOUS | Status: AC
  Administered 2012-09-29: 16:00:00 via TOPICAL

## 2012-09-29 NOTE — Progress Notes (Signed)
Patient here for weekly assessment of radiation left breast.Radiation Therapy and Ypu Booklet to be given as well as radiaplex and alra deodorant.Completed 2 of 28 treatments so far.

## 2012-09-29 NOTE — Progress Notes (Signed)
   Department of Radiation Oncology  Phone:  [phone] Fax:        [phone]  Weekly Treatment Note    Name: Destiny Harrell Date: 09/29/2012 MRN: [id number] DOB: August 27, 1960   Current dose: 3.6 Gy  Current fraction: 2   MEDICATIONS: Current Outpatient Prescriptions  Medication Sig Dispense Refill  . ibuprofen (ADVIL,MOTRIN) 200 MG tablet Take 200 mg by mouth every 6 (six) hours as needed for pain.      . Multiple Vitamins-Minerals (MULTIVITAMIN WITH MINERALS) tablet Take 1 tablet by mouth daily.       No current facility-administered medications for this encounter.     ALLERGIES: Review of patient's allergies indicates no known allergies.   LABORATORY DATA:  Lab Results  Component Value Date   WBC 9.7 09/15/2012   HGB 13.8 09/15/2012   HCT 40.8 09/15/2012   MCV 92.7 09/15/2012   PLT 351 09/15/2012   Lab Results  Component Value Date   NA 142 09/15/2012   K 4.0 09/15/2012   CL 106 09/15/2012   CO2 27 09/15/2012   Lab Results  Component Value Date   ALT 34 09/15/2012   AST 30 09/15/2012   ALKPHOS 70 09/15/2012   BILITOT 0.29 09/15/2012     NARRATIVE: Destiny Harrell was seen today for weekly treatment management. The chart was checked and the patient's films were reviewed. The patient is doing well. She has not had any problems in her first week so far.  PHYSICAL EXAMINATION: weight is 157 lb 8 oz (71.442 kg). Her temperature is 98.5 F (36.9 C). Her blood pressure is 121/85 and her pulse is 73.        ASSESSMENT: The patient is doing satisfactorily with treatment.  PLAN: We will continue with the patient's radiation treatment as planned.

## 2012-10-02 ENCOUNTER — Ambulatory Visit
Admission: RE | Admit: 2012-10-02 | Discharge: 2012-10-02 | Disposition: A | Discharge status: Home / Self Care | Payer: 59 | Source: Ambulatory Visit | Attending: Radiation Oncology | Admitting: Radiation Oncology

## 2012-10-03 ENCOUNTER — Ambulatory Visit
Admission: RE | Admit: 2012-10-03 | Discharge: 2012-10-03 | Disposition: A | Discharge status: Home / Self Care | Payer: 59 | Source: Ambulatory Visit | Attending: Radiation Oncology | Admitting: Radiation Oncology

## 2012-10-04 ENCOUNTER — Ambulatory Visit
Admission: RE | Admit: 2012-10-04 | Discharge: 2012-10-04 | Disposition: A | Discharge status: Home / Self Care | Payer: 59 | Source: Ambulatory Visit | Attending: Radiation Oncology | Admitting: Radiation Oncology

## 2012-10-05 ENCOUNTER — Ambulatory Visit
Admission: RE | Admit: 2012-10-05 | Discharge: 2012-10-05 | Disposition: A | Discharge status: Home / Self Care | Payer: 59 | Source: Ambulatory Visit | Attending: Radiation Oncology | Admitting: Radiation Oncology

## 2012-10-06 ENCOUNTER — Ambulatory Visit
Admission: RE | Admit: 2012-10-06 | Discharge: 2012-10-06 | Disposition: A | Discharge status: Home / Self Care | Payer: 59 | Source: Ambulatory Visit | Attending: Radiation Oncology | Admitting: Radiation Oncology

## 2012-10-06 VITALS — BP 109/75 | HR 75 | Temp 99.0°F | Wt 157.2 lb

## 2012-10-06 DIAGNOSIS — C50112 Malignant neoplasm of central portion of left female breast: Secondary | ICD-10-CM

## 2012-10-06 NOTE — Progress Notes (Signed)
Patient here for routine weekly assessment of left breast radiation.Completed 7 of 28 treatments.Patient has read Radiation booklet and voiced understanding.Able to voice at least 2 side effects of treatment.

## 2012-10-06 NOTE — Progress Notes (Signed)
   Department of Radiation Oncology  Phone:  [phone] Fax:        [phone]  Weekly Treatment Note    Name: Destiny Harrell Date: 10/06/2012 MRN: [id number] DOB: 18-Oct-1960   Current dose: 12.6 Gy  Current fraction: 7   MEDICATIONS: Current Outpatient Prescriptions  Medication Sig Dispense Refill  . ibuprofen (ADVIL,MOTRIN) 200 MG tablet Take 200 mg by mouth every 6 (six) hours as needed for pain.      . Multiple Vitamins-Minerals (MULTIVITAMIN WITH MINERALS) tablet Take 1 tablet by mouth daily.       No current facility-administered medications for this encounter.     ALLERGIES: Review of patient's allergies indicates no known allergies.   LABORATORY DATA:  Lab Results  Component Value Date   WBC 9.7 09/15/2012   HGB 13.8 09/15/2012   HCT 40.8 09/15/2012   MCV 92.7 09/15/2012   PLT 351 09/15/2012   Lab Results  Component Value Date   NA 142 09/15/2012   K 4.0 09/15/2012   CL 106 09/15/2012   CO2 27 09/15/2012   Lab Results  Component Value Date   ALT 34 09/15/2012   AST 30 09/15/2012   ALKPHOS 70 09/15/2012   BILITOT 0.29 09/15/2012     NARRATIVE: Destiny Harrell was seen today for weekly treatment management. The chart was checked and the patient's films were reviewed. The patient states that she is doing very well. No skin changes so far. Her energy level remains good.  PHYSICAL EXAMINATION: weight is 157 lb 3.2 oz (71.305 kg). Her temperature is 99 F (37.2 C). Her blood pressure is 109/75 and her pulse is 75.      minimal hyperpigmentation present.  ASSESSMENT: The patient is doing satisfactorily with treatment.  PLAN: We will continue with the patient's radiation treatment as planned.

## 2012-10-09 ENCOUNTER — Ambulatory Visit
Admission: RE | Admit: 2012-10-09 | Discharge: 2012-10-09 | Disposition: A | Discharge status: Home / Self Care | Payer: 59 | Source: Ambulatory Visit | Attending: Radiation Oncology | Admitting: Radiation Oncology

## 2012-10-10 ENCOUNTER — Ambulatory Visit
Admission: RE | Admit: 2012-10-10 | Discharge: 2012-10-10 | Disposition: A | Discharge status: Home / Self Care | Payer: 59 | Source: Ambulatory Visit | Attending: Radiation Oncology | Admitting: Radiation Oncology

## 2012-10-11 ENCOUNTER — Ambulatory Visit
Admission: RE | Admit: 2012-10-11 | Discharge: 2012-10-11 | Disposition: A | Discharge status: Home / Self Care | Payer: 59 | Source: Ambulatory Visit | Attending: Radiation Oncology | Admitting: Radiation Oncology

## 2012-10-12 ENCOUNTER — Ambulatory Visit
Admission: RE | Admit: 2012-10-12 | Discharge: 2012-10-12 | Disposition: A | Discharge status: Home / Self Care | Payer: 59 | Source: Ambulatory Visit | Attending: Radiation Oncology | Admitting: Radiation Oncology

## 2012-10-13 ENCOUNTER — Ambulatory Visit
Admission: RE | Admit: 2012-10-13 | Discharge: 2012-10-13 | Disposition: A | Discharge status: Home / Self Care | Payer: 59 | Source: Ambulatory Visit | Attending: Radiation Oncology | Admitting: Radiation Oncology

## 2012-10-16 ENCOUNTER — Ambulatory Visit
Admission: RE | Admit: 2012-10-16 | Discharge: 2012-10-16 | Disposition: A | Discharge status: Home / Self Care | Payer: 59 | Source: Ambulatory Visit | Attending: Radiation Oncology | Admitting: Radiation Oncology

## 2012-10-16 VITALS — BP 108/71 | HR 65 | Temp 98.5°F | Wt 160.2 lb

## 2012-10-16 DIAGNOSIS — C50112 Malignant neoplasm of central portion of left female breast: Secondary | ICD-10-CM

## 2012-10-16 NOTE — Progress Notes (Signed)
Patient for weekly assessment of left breast radiation.Completed 13 of 28 treatments.Denies pain but has noticed increased fatigue relieved with daily power naps.Skin mildly hyperpigmented without peeling, rash or itching.

## 2012-10-16 NOTE — Progress Notes (Signed)
   Department of Radiation Oncology  Phone:  [phone] Fax:        [phone]  Weekly Treatment Note    Name: Destiny Harrell Date: 10/16/2012 MRN: [id number] DOB: [birth year]   Current dose: 23.4 Gy  Current fraction: 13   MEDICATIONS: Current Outpatient Prescriptions  Medication Sig Dispense Refill  . ibuprofen (ADVIL,MOTRIN) 200 MG tablet Take 200 mg by mouth every 6 (six) hours as needed for pain.      . Multiple Vitamins-Minerals (MULTIVITAMIN WITH MINERALS) tablet Take 1 tablet by mouth daily.       No current facility-administered medications for this encounter.     ALLERGIES: Review of patient's allergies indicates no known allergies.   LABORATORY DATA:  Lab Results  Component Value Date   WBC 9.7 09/15/2012   HGB 13.8 09/15/2012   HCT 40.8 09/15/2012   MCV 92.7 09/15/2012   PLT 351 09/15/2012   Lab Results  Component Value Date   NA 142 09/15/2012   K 4.0 09/15/2012   CL 106 09/15/2012   CO2 27 09/15/2012   Lab Results  Component Value Date   ALT 34 09/15/2012   AST 30 09/15/2012   ALKPHOS 70 09/15/2012   BILITOT 0.29 09/15/2012     NARRATIVE: Destiny Harrell was seen today for weekly treatment management. The chart was checked and the patient's films were reviewed. The patient is doing well. She is using skin cream as directed each day. Some tiredness, relieved with.  PHYSICAL EXAMINATION: weight is 160 lb 3.2 oz (72.666 kg). Her temperature is 98.5 F (36.9 C). Her blood pressure is 108/71 and her pulse is 65.      mild hyperpigmentation present  ASSESSMENT: The patient is doing satisfactorily with treatment.  PLAN: We will continue with the patient's radiation treatment as planned.

## 2012-10-17 ENCOUNTER — Encounter: Payer: Self-pay | Admitting: *Deleted

## 2012-10-17 ENCOUNTER — Ambulatory Visit
Admission: RE | Admit: 2012-10-17 | Discharge: 2012-10-17 | Disposition: A | Discharge status: Home / Self Care | Payer: 59 | Source: Ambulatory Visit | Attending: Radiation Oncology | Admitting: Radiation Oncology

## 2012-10-17 NOTE — Progress Notes (Signed)
Mailed after appt letter to pt. 

## 2012-10-18 ENCOUNTER — Ambulatory Visit
Admission: RE | Admit: 2012-10-18 | Discharge: 2012-10-18 | Disposition: A | Discharge status: Home / Self Care | Payer: 59 | Source: Ambulatory Visit | Attending: Radiation Oncology | Admitting: Radiation Oncology

## 2012-10-19 ENCOUNTER — Ambulatory Visit
Admission: RE | Admit: 2012-10-19 | Discharge: 2012-10-19 | Disposition: A | Discharge status: Home / Self Care | Payer: 59 | Source: Ambulatory Visit | Attending: Radiation Oncology | Admitting: Radiation Oncology

## 2012-10-20 ENCOUNTER — Ambulatory Visit
Admission: RE | Admit: 2012-10-20 | Discharge: 2012-10-20 | Disposition: A | Discharge status: Home / Self Care | Payer: 59 | Source: Ambulatory Visit | Attending: Radiation Oncology | Admitting: Radiation Oncology

## 2012-10-20 VITALS — BP 111/83 | HR 89 | Temp 98.7°F | Ht 62.0 in | Wt 158.2 lb

## 2012-10-20 DIAGNOSIS — C50112 Malignant neoplasm of central portion of left female breast: Secondary | ICD-10-CM

## 2012-10-20 NOTE — Progress Notes (Signed)
   Department of Radiation Oncology  Phone:  [phone] Fax:        [phone]  Weekly Treatment Note    Name: Destiny Harrell Date: 10/20/2012 MRN: [id number] DOB: 01/23/1961   Current dose:  30.6 Gy  Current fraction: 17   MEDICATIONS: Current Outpatient Prescriptions  Medication Sig Dispense Refill  . hyaluronate sodium (RADIAPLEXRX) GEL Apply topically 2 (two) times daily.      Marland Kitchen ibuprofen (ADVIL,MOTRIN) 200 MG tablet Take 200 mg by mouth every 6 (six) hours as needed for pain.      . Multiple Vitamins-Minerals (MULTIVITAMIN WITH MINERALS) tablet Take 1 tablet by mouth daily.       No current facility-administered medications for this encounter.     ALLERGIES: Review of patient's allergies indicates no known allergies.   LABORATORY DATA:  Lab Results  Component Value Date   WBC 9.7 09/15/2012   HGB 13.8 09/15/2012   HCT 40.8 09/15/2012   MCV 92.7 09/15/2012   PLT 351 09/15/2012   Lab Results  Component Value Date   NA 142 09/15/2012   K 4.0 09/15/2012   CL 106 09/15/2012   CO2 27 09/15/2012   Lab Results  Component Value Date   ALT 34 09/15/2012   AST 30 09/15/2012   ALKPHOS 70 09/15/2012   BILITOT 0.29 09/15/2012     NARRATIVE: Destiny Harrell was seen today for weekly treatment management. The chart was checked and the patient's films were reviewed. The patient is doing well. She is experiencing some fatigue which is moderate. Some itching in the treatment area.  PHYSICAL EXAMINATION: height is 5\' 2"  (1.575 m) and weight is 158 lb 3.2 oz (71.759 kg). Her temperature is 98.7 F (37.1 C). Her blood pressure is 111/83 and her pulse is 89.      the skin looks good overall, hyperpigmentation. Dermatitis present in the upper inner aspect.  ASSESSMENT: The patient is doing satisfactorily with treatment.  PLAN: We will continue with the patient's radiation treatment as planned. The patient will continue her current skin care. She will begin using hydrocortisone cream in  the upper aspect of the breast.

## 2012-10-20 NOTE — Progress Notes (Signed)
Destiny Harrell here for weekly under treat visit.  She has had 17/28 fractions to her left breast.  She denies pain.  She does have fatigue.  She does have a red scattered rash on the outside of her left breast.  She states that it is itchy and appeared two days ago.  She is using the Radiaplex gel twice a day.

## 2012-10-23 ENCOUNTER — Encounter (INDEPENDENT_AMBULATORY_CARE_PROVIDER_SITE_OTHER): Payer: 59 | Admitting: Surgery

## 2012-10-23 ENCOUNTER — Ambulatory Visit
Admission: RE | Admit: 2012-10-23 | Discharge: 2012-10-23 | Disposition: A | Discharge status: Home / Self Care | Payer: 59 | Source: Ambulatory Visit | Attending: Radiation Oncology | Admitting: Radiation Oncology

## 2012-10-24 ENCOUNTER — Ambulatory Visit
Admission: RE | Admit: 2012-10-24 | Discharge: 2012-10-24 | Disposition: A | Discharge status: Home / Self Care | Payer: 59 | Source: Ambulatory Visit | Attending: Radiation Oncology | Admitting: Radiation Oncology

## 2012-10-25 ENCOUNTER — Ambulatory Visit
Admission: RE | Admit: 2012-10-25 | Discharge: 2012-10-25 | Disposition: A | Discharge status: Home / Self Care | Payer: 59 | Source: Ambulatory Visit | Attending: Radiation Oncology | Admitting: Radiation Oncology

## 2012-10-26 ENCOUNTER — Ambulatory Visit
Admission: RE | Admit: 2012-10-26 | Discharge: 2012-10-26 | Disposition: A | Discharge status: Home / Self Care | Payer: 59 | Source: Ambulatory Visit | Attending: Radiation Oncology | Admitting: Radiation Oncology

## 2012-10-27 ENCOUNTER — Ambulatory Visit
Admission: RE | Admit: 2012-10-27 | Discharge: 2012-10-27 | Disposition: A | Discharge status: Home / Self Care | Payer: 59 | Source: Ambulatory Visit | Attending: Radiation Oncology | Admitting: Radiation Oncology

## 2012-10-27 VITALS — BP 118/77 | HR 77 | Temp 98.6°F | Ht 62.0 in | Wt 157.2 lb

## 2012-10-27 DIAGNOSIS — C50112 Malignant neoplasm of central portion of left female breast: Secondary | ICD-10-CM

## 2012-10-27 NOTE — Progress Notes (Signed)
   Department of Radiation Oncology  Phone:  [phone] Fax:        [phone]  Weekly Treatment Note    Name: HARLEY MCCARTNEY Date: 10/27/2012 MRN: [id number] DOB: [birth year]   Current dose: 39.6 Gy  Current fraction: 22   MEDICATIONS: Current Outpatient Prescriptions  Medication Sig Dispense Refill  . hyaluronate sodium (RADIAPLEXRX) GEL Apply topically 2 (two) times daily.      Marland Kitchen ibuprofen (ADVIL,MOTRIN) 200 MG tablet Take 200 mg by mouth every 6 (six) hours as needed for pain.      . Multiple Vitamins-Minerals (MULTIVITAMIN WITH MINERALS) tablet Take 1 tablet by mouth daily.       No current facility-administered medications for this encounter.     ALLERGIES: Review of patient's allergies indicates no known allergies.   LABORATORY DATA:  Lab Results  Component Value Date   WBC 9.7 09/15/2012   HGB 13.8 09/15/2012   HCT 40.8 09/15/2012   MCV 92.7 09/15/2012   PLT 351 09/15/2012   Lab Results  Component Value Date   NA 142 09/15/2012   K 4.0 09/15/2012   CL 106 09/15/2012   CO2 27 09/15/2012   Lab Results  Component Value Date   ALT 34 09/15/2012   AST 30 09/15/2012   ALKPHOS 70 09/15/2012   BILITOT 0.29 09/15/2012     NARRATIVE: MAXENE BYINGTON was seen today for weekly treatment management. The chart was checked and the patient's films were reviewed. The patient indicates that she has done fairly well. Some ongoing itching/irritation within the treatment area.   PHYSICAL EXAMINATION: height is 5\' 2"  (1.575 m) and weight is 157 lb 3.2 oz (71.305 kg). Her temperature is 98.6 F (37 C). Her blood pressure is 118/77 and her pulse is 77.      dermatitis present medially. Overall skin looks very good.  ASSESSMENT: The patient is doing satisfactorily with treatment.  PLAN: We will continue with the patient's radiation treatment as planned. She will continue high-dose cortisone cream and also use some baby powder on the affected area. This has been soothing for her in the  past. She also may try Benadryl cream.

## 2012-10-27 NOTE — Progress Notes (Signed)
Destiny Harrell is here for weekly under treat visit.  She has had 22/28 fractions to her left breast.  She denies pain.  She does have a red, itchy rash on the outside of her left breast.  She has been using hydrocortizone cream but it has not been helping.  She is wondering if there is a powder she would be able to use that is OK with radiation.   She denies nausea.  She does have fatigue.

## 2012-10-30 ENCOUNTER — Ambulatory Visit
Admission: RE | Admit: 2012-10-30 | Discharge: 2012-10-30 | Disposition: A | Discharge status: Home / Self Care | Payer: 59 | Source: Ambulatory Visit | Attending: Radiation Oncology | Admitting: Radiation Oncology

## 2012-10-31 ENCOUNTER — Ambulatory Visit
Admission: RE | Admit: 2012-10-31 | Discharge: 2012-10-31 | Disposition: A | Discharge status: Home / Self Care | Payer: 59 | Source: Ambulatory Visit | Attending: Radiation Oncology | Admitting: Radiation Oncology

## 2012-11-01 ENCOUNTER — Ambulatory Visit
Admission: RE | Admit: 2012-11-01 | Discharge: 2012-11-01 | Disposition: A | Discharge status: Home / Self Care | Payer: 59 | Source: Ambulatory Visit | Attending: Radiation Oncology | Admitting: Radiation Oncology

## 2012-11-02 ENCOUNTER — Ambulatory Visit
Admission: RE | Admit: 2012-11-02 | Discharge: 2012-11-02 | Disposition: A | Discharge status: Home / Self Care | Payer: 59 | Source: Ambulatory Visit | Attending: Radiation Oncology | Admitting: Radiation Oncology

## 2012-11-03 ENCOUNTER — Ambulatory Visit
Admission: RE | Admit: 2012-11-03 | Discharge: 2012-11-03 | Disposition: A | Discharge status: Home / Self Care | Payer: 59 | Source: Ambulatory Visit | Attending: Radiation Oncology | Admitting: Radiation Oncology

## 2012-11-03 ENCOUNTER — Encounter: Payer: Self-pay | Admitting: Radiation Oncology

## 2012-11-03 VITALS — BP 117/86 | HR 90 | Temp 98.3°F | Ht 62.0 in | Wt 159.1 lb

## 2012-11-03 DIAGNOSIS — C50112 Malignant neoplasm of central portion of left female breast: Secondary | ICD-10-CM

## 2012-11-03 NOTE — Progress Notes (Signed)
  Radiation Oncology         [phone]) [phone] ________________________________  Name: Destiny Harrell MRN: [id number]  Date: 11/03/2012  DOB: 1961/01/09  Complex simulation note  The patient has undergone complex simulation for her upcoming boost treatment for her diagnosis of breast cancer. The patient has initially been planned to receive 50.4 gray. The patient will now receive a 10 gray boost to the seroma cavity which has been contoured. This will be accomplished using an en face electron field. Based on the depth of the target area, 15 MeV electrons will be used and this field has been normalized to the 97% isodose line. The patient's final total dose therefore will be 60.4 gray. A special port plan is requested for the boost treatment.   _______________________________  Radene Gunning, MD, PhD

## 2012-11-03 NOTE — Progress Notes (Signed)
   Department of Radiation Oncology  Phone:  [phone] Fax:        [phone]  Weekly Treatment Note    Name: Destiny Harrell Date: 11/03/2012 MRN: [id number] DOB: 1960-07-04   Current dose: 48.6 Gy  Current fraction: 27   MEDICATIONS: Current Outpatient Prescriptions  Medication Sig Dispense Refill  . hyaluronate sodium (RADIAPLEXRX) GEL Apply topically 2 (two) times daily.      Marland Kitchen ibuprofen (ADVIL,MOTRIN) 200 MG tablet Take 200 mg by mouth every 6 (six) hours as needed for pain.      . Multiple Vitamins-Minerals (MULTIVITAMIN WITH MINERALS) tablet Take 1 tablet by mouth daily.       No current facility-administered medications for this encounter.     ALLERGIES: Review of patient's allergies indicates no known allergies.   LABORATORY DATA:  Lab Results  Component Value Date   WBC 9.7 09/15/2012   HGB 13.8 09/15/2012   HCT 40.8 09/15/2012   MCV 92.7 09/15/2012   PLT 351 09/15/2012   Lab Results  Component Value Date   NA 142 09/15/2012   K 4.0 09/15/2012   CL 106 09/15/2012   CO2 27 09/15/2012   Lab Results  Component Value Date   ALT 34 09/15/2012   AST 30 09/15/2012   ALKPHOS 70 09/15/2012   BILITOT 0.29 09/15/2012     NARRATIVE: Destiny Harrell was seen today for weekly treatment management. The chart was checked and the patient's films were reviewed. The patient doing well overall. Some diffuse skin irritation. She is using skin cream 3 times per day.  PHYSICAL EXAMINATION: height is 5\' 2"  (1.575 m) and weight is 159 lb 1.6 oz (72.167 kg). Her temperature is 98.3 F (36.8 C). Her blood pressure is 117/86 and her pulse is 90.      diffuse hyperpigmentation/erythema. Some dry desquamation in the upper axilla and in the inframammary region.  ASSESSMENT: The patient is doing satisfactorily with treatment.  PLAN: We will continue with the patient's radiation treatment as planned. She will continue her current skin care. She will begin her boost treatment on  Tuesday.

## 2012-11-03 NOTE — Progress Notes (Signed)
Destiny Harrell here for weekly under treat visit.  She has had 27/28 fractions to her left breast.  She denies pain.  She does have fatigue and feels worn out.  The skin on her left breast does have a scattered, red rash.  Underneath her left breast, the skin is peeling.  She is using radiaplex three times a day.

## 2012-11-06 ENCOUNTER — Ambulatory Visit
Admission: RE | Admit: 2012-11-06 | Discharge: 2012-11-06 | Disposition: A | Discharge status: Home / Self Care | Payer: 59 | Source: Ambulatory Visit | Attending: Radiation Oncology | Admitting: Radiation Oncology

## 2012-11-07 ENCOUNTER — Ambulatory Visit
Admission: RE | Admit: 2012-11-07 | Discharge: 2012-11-07 | Disposition: A | Discharge status: Home / Self Care | Payer: 59 | Source: Ambulatory Visit | Attending: Radiation Oncology | Admitting: Radiation Oncology

## 2012-11-08 ENCOUNTER — Ambulatory Visit
Admission: RE | Admit: 2012-11-08 | Discharge: 2012-11-08 | Disposition: A | Discharge status: Home / Self Care | Payer: 59 | Source: Ambulatory Visit | Attending: Radiation Oncology | Admitting: Radiation Oncology

## 2012-11-09 ENCOUNTER — Ambulatory Visit
Admission: RE | Admit: 2012-11-09 | Discharge: 2012-11-09 | Disposition: A | Discharge status: Home / Self Care | Payer: 59 | Source: Ambulatory Visit | Attending: Radiation Oncology | Admitting: Radiation Oncology

## 2012-11-10 ENCOUNTER — Ambulatory Visit
Admission: RE | Admit: 2012-11-10 | Discharge: 2012-11-10 | Disposition: A | Discharge status: Home / Self Care | Payer: 59 | Source: Ambulatory Visit | Attending: Radiation Oncology | Admitting: Radiation Oncology

## 2012-11-10 VITALS — BP 123/78 | HR 87 | Temp 98.2°F | Ht 62.0 in | Wt 155.3 lb

## 2012-11-10 DIAGNOSIS — C50112 Malignant neoplasm of central portion of left female breast: Secondary | ICD-10-CM

## 2012-11-10 NOTE — Progress Notes (Signed)
Ms. Deller here for weekly under treat visit.  She has had 31/33 fractions to her left breast.  She is reporting pain in her left breast at a 2/10.  She does have fatigue.  Her left breast is red.  She has peeling underneath her arm and under her breast.  She also has a pimple under her arm.  She has been using radiaplex gel twice a day.

## 2012-11-10 NOTE — Progress Notes (Signed)
   Department of Radiation Oncology  Phone:  [phone] Fax:        [phone]  Weekly Treatment Note    Name: Destiny Harrell Date: 11/10/2012 MRN: [id number] DOB: [birth year]   Current dose: 58.4 Gy  Current fraction: 32   MEDICATIONS: Current Outpatient Prescriptions  Medication Sig Dispense Refill  . hyaluronate sodium (RADIAPLEXRX) GEL Apply topically 2 (two) times daily.      Marland Kitchen ibuprofen (ADVIL,MOTRIN) 200 MG tablet Take 200 mg by mouth every 6 (six) hours as needed for pain.      . Multiple Vitamins-Minerals (MULTIVITAMIN WITH MINERALS) tablet Take 1 tablet by mouth daily.       No current facility-administered medications for this encounter.     ALLERGIES: Review of patient's allergies indicates no known allergies.   LABORATORY DATA:  Lab Results  Component Value Date   WBC 9.7 09/15/2012   HGB 13.8 09/15/2012   HCT 40.8 09/15/2012   MCV 92.7 09/15/2012   PLT 351 09/15/2012   Lab Results  Component Value Date   NA 142 09/15/2012   K 4.0 09/15/2012   CL 106 09/15/2012   CO2 27 09/15/2012   Lab Results  Component Value Date   ALT 34 09/15/2012   AST 30 09/15/2012   ALKPHOS 70 09/15/2012   BILITOT 0.29 09/15/2012     NARRATIVE: Destiny Harrell was seen today for weekly treatment management. The chart was checked and the patient's films were reviewed. The patient is clinically stable. Ongoing skin irritation. No major change.  PHYSICAL EXAMINATION: height is 5\' 2"  (1.575 m) and weight is 155 lb 4.8 oz (70.444 kg). Her temperature is 98.2 F (36.8 C). Her blood pressure is 123/78 and her pulse is 87.      extensive hyperpigmentation with some dry desquamation especially in the upper axilla. No moist desquamation.  ASSESSMENT: The patient is doing satisfactorily with treatment.  PLAN: We will continue with the patient's radiation treatment as planned. The patient will followup in one month. I expect her skin to heal well over the next several weeks.

## 2012-11-13 ENCOUNTER — Ambulatory Visit: Payer: 59

## 2012-11-14 ENCOUNTER — Ambulatory Visit
Admission: RE | Admit: 2012-11-14 | Discharge: 2012-11-14 | Disposition: A | Discharge status: Home / Self Care | Payer: 59 | Source: Ambulatory Visit | Attending: Radiation Oncology | Admitting: Radiation Oncology

## 2012-11-14 ENCOUNTER — Ambulatory Visit: Payer: 59

## 2012-11-14 ENCOUNTER — Encounter: Payer: Self-pay | Admitting: Radiation Oncology

## 2012-11-15 ENCOUNTER — Encounter (INDEPENDENT_AMBULATORY_CARE_PROVIDER_SITE_OTHER): Payer: 59 | Admitting: Surgery

## 2012-11-15 ENCOUNTER — Ambulatory Visit: Payer: 59

## 2012-11-16 ENCOUNTER — Ambulatory Visit: Payer: 59

## 2012-11-24 NOTE — Progress Notes (Signed)
  Radiation Oncology         [phone]) [phone] ________________________________  Name: Destiny Harrell MRN: [id number]  Date: 11/14/2012  DOB: 1960-11-17  End of Treatment Note  Diagnosis:   Invasive ductal carcinoma of the left breast     Indication for treatment:  Curative       Radiation treatment dates:   09/28/2012 through 11/14/2012  Site/dose:   The patient was initially treated to a dose of 50.4 gray for a course of whole breast radiotherapy using medial and lateral tangent fields. A breath-hold technique was used. The patient then received a boost to the tumor cavity/seroma for an additional 10 gray at 2 gray per fraction. The patient's final dose was 60.4 gray.  Narrative: The patient tolerated radiation treatment relatively well.   The patient exhibited some mild to moderate skin irritation towards the end of treatment as expected. No moist desquamation at the end of treatment.  Plan: The patient has completed radiation treatment. The patient will return to radiation oncology clinic for routine followup in one month. I advised the patient to call or return sooner if they have any questions or concerns related to their recovery or treatment. ________________________________  Radene Gunning, M.D., Ph.D.

## 2012-12-01 ENCOUNTER — Encounter (INDEPENDENT_AMBULATORY_CARE_PROVIDER_SITE_OTHER): Payer: Self-pay | Admitting: Surgery

## 2012-12-01 ENCOUNTER — Ambulatory Visit (INDEPENDENT_AMBULATORY_CARE_PROVIDER_SITE_OTHER): Payer: 59 | Admitting: Surgery

## 2012-12-01 VITALS — BP 110/78 | HR 64 | Temp 97.8°F | Resp 16 | Ht 62.5 in | Wt 158.6 lb

## 2012-12-01 DIAGNOSIS — Z9889 Other specified postprocedural states: Secondary | ICD-10-CM

## 2012-12-01 NOTE — Patient Instructions (Signed)
Return 6 months

## 2012-12-01 NOTE — Progress Notes (Signed)
Patient returns for left breast lumpectomy and sentinel lymph node mapping for a stage I left breast cancer ER positive PR negative HER-2/neu negative. Margins negative. She's doing well. Finished radiation therapy  Exam: Left breast lumpectomy incision healing well. No erythema or seroma. Left axilla clean dry intact without seroma.  Impression: Stage I left breast cancer status post breast conservation  Plan: return 6 months.  Doing well.  To  See medical oncology.  Done with radiation.

## 2012-12-12 ENCOUNTER — Telehealth: Payer: Self-pay | Admitting: Oncology

## 2012-12-14 ENCOUNTER — Encounter: Payer: Self-pay | Admitting: Oncology

## 2012-12-14 ENCOUNTER — Telehealth: Payer: Self-pay | Admitting: *Deleted

## 2012-12-14 ENCOUNTER — Ambulatory Visit (HOSPITAL_BASED_OUTPATIENT_CLINIC_OR_DEPARTMENT_OTHER): Payer: 59 | Admitting: Oncology

## 2012-12-14 VITALS — BP 126/79 | HR 75 | Temp 98.2°F | Resp 20 | Ht 62.5 in | Wt 159.4 lb

## 2012-12-14 DIAGNOSIS — C50112 Malignant neoplasm of central portion of left female breast: Secondary | ICD-10-CM

## 2012-12-14 DIAGNOSIS — C50119 Malignant neoplasm of central portion of unspecified female breast: Secondary | ICD-10-CM

## 2012-12-14 MED ORDER — ANASTROZOLE 1 MG PO TABS: 1.0000 mg | ORAL_TABLET | Freq: Every day | ORAL | Status: AC

## 2012-12-14 NOTE — Telephone Encounter (Signed)
appts made and printed...td 

## 2012-12-14 NOTE — Progress Notes (Signed)
OFFICE PROGRESS NOTE  CC  Sissy Hoff, MD [address] [place] [address] Dr. Dorothy Puffer  Dr. 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   STAGE:  Left Breast  TmicN0  ER+PR- Her2-  Clinical stage I  PRIOR THERAPY: #1The patient has a recent diagnosis of left-sided breast cancer and she has completed a lumpectomy. She initially was found to have some microcalcifications on screening mammography. 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 Receptor studies indicated that the tumor was ER positive, PR negative, and HER-2/neu negative.  The patient proceeded to undergo an MRI scan of the breasts bilaterally. There was a suspicious area of linear enhancement extending 3.5 cm anterior to the biopsy site. A 2 cm biopsy cavity was noted with clip placed slightly superiorly.   #2The patient was felt to be a good candidate for breast conservation treatment and she proceeded to undergo a lumpectomy on 3/ 13/2014. This returned positive for invasive carcinoma measuring 1.8 cm. 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 The invasive carcinoma was 0.4 cm from the nearest margin, as was the in situ disease as well. The margins were therefore negative. Lymphovascular space invasion was absent. The invasive disease was grade 2. The majority of the carcinoma was in situ disease but there were areas of microscopic invasion. Therefore this was staged as a T69miN0 tumor, with both of the sampled lymph nodes negative for carcinoma  #3 patient is now status post radiation therapy to the left breast administered by Dr. Dorothy Puffer. She received date 09/28/2012 through 11/14/2012  #4 patient will begin antiestrogen therapy with Arimidex 1 mg daily. Risks and benefits and side effects of  treatment and rationale were discussed with her completely. She was given information regarding the medicine as well. It is presented in the after visit summary. She will begin this 12/14/2012. A total of 5 years of therapy is planned.   CURRENT THERAPY:Arimidex 1 mg daily  INTERVAL HISTORY: Destiny Harrell 52 y.o. female returns for followup visit after completing her radiation. Overall she's been tolerating the radiation well without any problems. She did develop some skin desquamation and erythema and tenderness. But this is now resolved completely. She denies any nausea vomiting fevers chills night sweats headaches shortness of breath chest pains palpitations she does get fatigued occasionally. She has no nipple discharge or other breast changes. Remainder of the 10 point review of systems is negative.  MEDICAL HISTORY: Past Medical History  Diagnosis Date  . IBS (irritable bowel syndrome)   . Medical history non-contributory   . Breast cancer     ALLERGIES:  has No Known Allergies.  MEDICATIONS:  Current Outpatient Prescriptions  Medication Sig Dispense Refill  . ibuprofen (ADVIL,MOTRIN) 200 MG tablet Take 200 mg by mouth every 6 (six) hours as needed for pain.      . Multiple Vitamins-Minerals (MULTIVITAMIN WITH MINERALS) tablet Take 1 tablet by mouth daily.       No current facility-administered medications for this visit.    SURGICAL HISTORY:  Past Surgical History  Procedure Laterality Date  . Cesarean section  1984, 1986, 1988  . Oophorectomy  Right 1999, Left 2001  . Cholecystectomy  2010  . Breast lumpectomy with needle localization and axillary sentinel lymph node bx Left 08/31/2012  Procedure: left BREAST needle localized  LUMPECTOMY WITH left sentinel lymph node mapping ;  Surgeon: Clovis Pu. Cornett, MD;  Location: [facility];  Service: General;  Laterality: Left;  needle localization at breast center of [place] 7:30   . Breast surgery  08/31/12     ER+PR-HER-2neu-  . Breast biopsy  08/17/2012    REVIEW OF SYSTEMS:  Pertinent items are noted in HPI.   HEALTH MAINTENANCE:   PHYSICAL EXAMINATION: Blood pressure 126/79, pulse 75, temperature 98.2 F (36.8 C), temperature source Oral, resp. rate 20, height 5' 2.5" (1.588 m), weight 159 lb 6.4 oz (72.303 kg). Body mass index is 28.67 kg/(m^2). ECOG PERFORMANCE STATUS: 0 - Asymptomatic   General appearance: alert, cooperative and appears stated age Resp: clear to auscultation bilaterally Cardio: regular rate and rhythm GI: soft, non-tender; bowel sounds normal; no masses,  no organomegaly Extremities: extremities normal, atraumatic, no cyanosis or edema Neurologic: Grossly normal Right breast: No masses nipple discharge or skin changes. Left breast: No masses nipple discharge well healed surgical scar no other skin changes  LABORATORY DATA: Lab Results  Component Value Date   WBC 9.7 09/15/2012   HGB 13.8 09/15/2012   HCT 40.8 09/15/2012   MCV 92.7 09/15/2012   PLT 351 09/15/2012      Chemistry      Component Value Date/Time   NA 142 09/15/2012 1445   K 4.0 09/15/2012 1445   CL 106 09/15/2012 1445   CO2 27 09/15/2012 1445   BUN 16.0 09/15/2012 1445   CREATININE 0.9 09/15/2012 1445      Component Value Date/Time   CALCIUM 9.6 09/15/2012 1445   ALKPHOS 70 09/15/2012 1445   AST 30 09/15/2012 1445   ALT 34 09/15/2012 1445   BILITOT 0.29 09/15/2012 1445     Diagnosis 1. Breast, lumpectomy, Left - INVASIVE DUCTAL CARCINOMA, SEE COMMENT. - INVASIVE CARCINOMA IS 0.4 CM FROM NEAREST MARGIN (INFERIOR). - DUCTAL CARCINOMA IN SITU WITH COMEDO NECROSIS AND NECROSIS AND CALCIFICATIONS. - SEE TUMOR TEMPLATE BELOW. 2. Lymph node, sentinel, biopsy, Left axilla - ONE LYMPH NODE, NEGATIVE FOR TUMOR (0/1). 3. Lymph node, sentinel, biopsy, Left axilla - ONE LYMPH NODE, NEGATIVE FOR TUMOR (0/1). Microscopic Comment 1. BREAST, INVASIVE TUMOR, WITH LYMPH NODE SAMPLING Specimen, including  laterality: Left breast Procedure: Lumpectomy Grade: II of III see comment Tubule formation: III Nuclear pleomorphism: II Mitotic:I Tumor size (gross measurement): See comment Margins: Invasive, distance to closest margin: 0.4 cm In-situ, distance to closest margin: 0.4 cm (inferior) If margin positive, focally or broadly: N/A Lymphovascular invasion: Absent Ductal carcinoma in situ: Present Grade: II - III Extensive intraductal component: Present Lobular neoplasia: Absent Tumor focality: Unifocal 1 of 3 Amended copy Amended FINAL for [place], Lupita Leash [phone]) Microscopic Comment(continued) Treatment effect: None If present, treatment effect in breast tissue, lymph nodes or both: N/A Extent of tumor: Skin: Microscopically negative Nipple: N/A Skeletal muscle: N/A Lymph nodes: # examined: 2 Lymph nodes with metastasis: 0 Breast prognostic profile: Estrogen receptor: Not repeated, previous study demonstrated 90% positivity ([id number]) Progesterone receptor: Not repeated, previous study demonstrated 0% positivity ([id number]) Her 2 neu: Repeated, previous study demonstrated no amplification (1.19) ([id number]) Ki-67: Not repeated, previous study demonstrated 20% proliferation rate ([id number]) Non-neoplastic breast: Previous biopsy site and microcalcifications TNM: pT61mi, pN0, pMX see comment (CRR:caf 09/01/12) Comment: Although the majority of the carcinoma present is in situ, there are areas of microscopic invasion identified (pT52mi). These areas are confirmed by the absence of the myoepithelial layer (negative  Calponin, p63 and smooth muscle mycin heavy chain immunostaining). Histologic grading is limited by the size of the invasive tumor. (CRR:caf 09/04/12) AMENDMENT: Although there is microscopic invasive adenocarcinoma present spanning the 1.8 cm biopsy cavity, the invasive tumor is best staged as pT105mi. This was discussed with Dr. Luisa Hart on 09/15/12. (MM:gt,  09/15/12) [place]  RADIOGRAPHIC STUDIES:  No results found.  ASSESSMENT: 52 year old female with  #1 stage I (T38micN0) invasive ductal carcinoma with ductal carcinoma in situ, ER positive PR negative HER-2/neu negative. Patient is status post lumpectomy with sentinel lymph node biopsy the lymph node was negative for metastatic disease.  #2 patient is now status post adjuvant radiation therapy completed in May 2014.  #3 patient will begin adjuvant antiestrogen therapy with aroma Milligram daily. Risks benefits and side effects were discussed with the patient completely.   PLAN:   #1 patient will proceed with Arimidex 1 mg daily starting 12/14/2012.  #2 she will be seen back in 3 months time for followup.   All questions were answered. The patient knows to call the clinic with any problems, questions or concerns. We can certainly see the patient much sooner if necessary.  I spent 25 minutes counseling the patient face to face. The total time spent in the appointment was 30 minutes.    Drue Second, MD Medical/Oncology [facility] [phone] (beeper) [phone] (Office)  12/14/2012, 3:08 PM

## 2012-12-14 NOTE — Patient Instructions (Addendum)
Proceed with arimidex 1 mg daily  I will see you back in 3 months  Anastrozole tablets What is this medicine? ANASTROZOLE (an AS troe zole) is used to treat breast cancer in women who have gone through menopause. Some types of breast cancer depend on estrogen to grow, and this medicine can stop tumor growth by blocking estrogen production. This medicine may be used for other purposes; ask your health care provider or pharmacist if you have questions. What should I tell my health care provider before I take this medicine? They need to know if you have any of these conditions: -liver disease -an unusual or allergic reaction to anastrozole, other medicines, foods, dyes, or preservatives -pregnant or trying to get pregnant -breast-feeding How should I use this medicine? Take this medicine by mouth with a glass of water. Follow the directions on the prescription label. You can take this medicine with or without food. Take your doses at regular intervals. Do not take your medicine more often than directed. Do not stop taking except on the advice of your doctor or health care professional. Talk to your pediatrician regarding the use of this medicine in children. Special care may be needed. Overdosage: If you think you have taken too much of this medicine contact a poison control center or emergency room at once. NOTE: This medicine is only for you. Do not share this medicine with others. What if I miss a dose? If you miss a dose, take it as soon as you can. If it is almost time for your next dose, take only that dose. Do not take double or extra doses. What may interact with this medicine? Do not take this medicine with any of the following medications: -female hormones, like estrogens or progestins and birth control pills This medicine may also interact with the following medications: -tamoxifen This list may not describe all possible interactions. Give your health care provider a list of all the  medicines, herbs, non-prescription drugs, or dietary supplements you use. Also tell them if you smoke, drink alcohol, or use illegal drugs. Some items may interact with your medicine. What should I watch for while using this medicine? Visit your doctor or health care professional for regular checks on your progress. Let your doctor or health care professional know about any unusual vaginal bleeding. Do not treat yourself for diarrhea, nausea, vomiting or other side effects. Ask your doctor or health care professional for advice. What side effects may I notice from receiving this medicine? Side effects that you should report to your doctor or health care professional as soon as possible: -allergic reactions like skin rash, itching or hives, swelling of the face, lips, or tongue -any new or unusual symptoms -breathing problems -chest pain -leg pain or swelling -vomiting Side effects that usually do not require medical attention (report to your doctor or health care professional if they continue or are bothersome): -back or bone pain -cough, or throat infection -diarrhea or constipation -dizziness -headache -hot flashes -loss of appetite -nausea -sweating -weakness and tiredness -weight gain This list may not describe all possible side effects. Call your doctor for medical advice about side effects. You may report side effects to FDA at 1-800-FDA-1088. Where should I keep my medicine? Keep out of the reach of children. Store at room temperature between 20 and 25 degrees C (68 and 77 degrees F). Throw away any unused medicine after the expiration date. NOTE: This sheet is a summary. It may not cover all possible information.  If you have questions about this medicine, talk to your doctor, pharmacist, or health care provider.  2013, [employer]vier/[employer] Standard. (08/18/2007 4:31:52 PM)

## 2013-03-15 ENCOUNTER — Other Ambulatory Visit: Payer: Self-pay | Admitting: Medical Oncology

## 2013-03-15 DIAGNOSIS — C50912 Malignant neoplasm of unspecified site of left female breast: Secondary | ICD-10-CM

## 2013-03-16 ENCOUNTER — Ambulatory Visit: Payer: 59 | Admitting: Adult Health

## 2013-03-16 ENCOUNTER — Other Ambulatory Visit: Payer: 59 | Admitting: Lab

## 2013-03-29 ENCOUNTER — Telehealth: Payer: Self-pay | Admitting: Oncology

## 2013-03-29 NOTE — Telephone Encounter (Signed)
, °

## 2013-04-10 ENCOUNTER — Telehealth: Payer: Self-pay | Admitting: Oncology

## 2013-04-10 NOTE — Telephone Encounter (Signed)
, °

## 2013-04-16 ENCOUNTER — Ambulatory Visit: Payer: 59 | Admitting: Adult Health

## 2013-04-16 ENCOUNTER — Other Ambulatory Visit: Payer: 59 | Admitting: Lab

## 2013-04-18 ENCOUNTER — Other Ambulatory Visit (HOSPITAL_BASED_OUTPATIENT_CLINIC_OR_DEPARTMENT_OTHER): Payer: 59 | Admitting: Lab

## 2013-04-18 ENCOUNTER — Encounter: Payer: Self-pay | Admitting: Adult Health

## 2013-04-18 ENCOUNTER — Ambulatory Visit (HOSPITAL_BASED_OUTPATIENT_CLINIC_OR_DEPARTMENT_OTHER): Payer: 59 | Admitting: Adult Health

## 2013-04-18 ENCOUNTER — Encounter (INDEPENDENT_AMBULATORY_CARE_PROVIDER_SITE_OTHER): Payer: Self-pay

## 2013-04-18 ENCOUNTER — Telehealth: Payer: Self-pay | Admitting: Oncology

## 2013-04-18 VITALS — BP 128/83 | HR 68 | Temp 98.4°F | Resp 18 | Ht 62.0 in | Wt 164.6 lb

## 2013-04-18 DIAGNOSIS — R5381 Other malaise: Secondary | ICD-10-CM

## 2013-04-18 DIAGNOSIS — C50119 Malignant neoplasm of central portion of unspecified female breast: Secondary | ICD-10-CM

## 2013-04-18 DIAGNOSIS — C50919 Malignant neoplasm of unspecified site of unspecified female breast: Secondary | ICD-10-CM

## 2013-04-18 DIAGNOSIS — Z170 Estrogen receptor positive status [ER+]: Secondary | ICD-10-CM

## 2013-04-18 DIAGNOSIS — C50912 Malignant neoplasm of unspecified site of left female breast: Secondary | ICD-10-CM

## 2013-04-18 LAB — CBC WITH DIFFERENTIAL/PLATELET
BASO%: 1.1 % (ref 0.0–2.0)
Basophils Absolute: 0.1 10*3/uL (ref 0.0–0.1)
HCT: 39.5 % (ref 34.8–46.6)
HGB: 13.5 g/dL (ref 11.6–15.9)
MCHC: 34.2 g/dL (ref 31.5–36.0)
MONO#: 0.6 10*3/uL (ref 0.1–0.9)
NEUT%: 54.6 % (ref 38.4–76.8)
RDW: 12.5 % (ref 11.2–14.5)
WBC: 6.9 10*3/uL (ref 3.9–10.3)
lymph#: 2.3 10*3/uL (ref 0.9–3.3)

## 2013-04-18 LAB — COMPREHENSIVE METABOLIC PANEL (CC13)
ALT: 24 U/L (ref 0–55)
Albumin: 3.7 g/dL (ref 3.5–5.0)
Anion Gap: 7 mEq/L (ref 3–11)
CO2: 24 mEq/L (ref 22–29)
Calcium: 9.3 mg/dL (ref 8.4–10.4)
Chloride: 110 mEq/L — ABNORMAL HIGH (ref 98–109)
Creatinine: 0.7 mg/dL (ref 0.6–1.1)
Potassium: 3.9 mEq/L (ref 3.5–5.1)

## 2013-04-18 NOTE — Progress Notes (Addendum)
OFFICE PROGRESS NOTE  CC  Sissy Hoff, MD [address], [address] [place] [address] Dr. Dorothy Puffer  Dr. 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   STAGE:  Left Breast  TmicN0  ER+PR- Her2-  Clinical stage I  PRIOR THERAPY: #1The patient has a recent diagnosis of left-sided breast cancer and she has completed a lumpectomy. She initially was found to have some microcalcifications on screening mammography. 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 Receptor studies indicated that the tumor was ER positive, PR negative, and HER-2/neu negative.  The patient proceeded to undergo an MRI scan of the breasts bilaterally. There was a suspicious area of linear enhancement extending 3.5 cm anterior to the biopsy site. A 2 cm biopsy cavity was noted with clip placed slightly superiorly.   #2The patient was felt to be a good candidate for breast conservation treatment and she proceeded to undergo a lumpectomy on 3/ 13/2014. This returned positive for invasive carcinoma measuring 1.8 cm. 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 The invasive carcinoma was 0.4 cm from the nearest margin, as was the in situ disease as well. The margins were therefore negative. Lymphovascular space invasion was absent. The invasive disease was grade 2. The majority of the carcinoma was in situ disease but there were areas of microscopic invasion. Therefore this was staged as a T11miN0 tumor, with both of the sampled lymph nodes negative for carcinoma  #3 patient is now status post radiation therapy to the left breast administered by Dr. Dorothy Puffer. She received date 09/28/2012 through 11/14/2012  #4 patient will begin antiestrogen therapy with Arimidex 1 mg daily. Risks and benefits and side  effects of treatment and rationale were discussed with her completely. She was given information regarding the medicine as well. It is presented in the after visit summary. She will begin this 12/14/2012. A total of 5 years of therapy is planned.   CURRENT THERAPY:Arimidex 1 mg daily  INTERVAL HISTORY: PAYLIN HAILU 52 y.o. female returns for followup visit after three months of Arimidex therapy.  She is doing well.  She is achy and fatigued, has tolerable joint pain.  She denies fevers, chills, nausea, vomiting, constipation, diarrhea, numbness, or any further concerns.  She did just move into  A new home.  MEDICAL HISTORY: Past Medical History  Diagnosis Date  . IBS (irritable bowel syndrome)   . Medical history non-contributory   . Breast cancer     ALLERGIES:  has No Known Allergies.  MEDICATIONS:  Current Outpatient Prescriptions  Medication Sig Dispense Refill  . anastrozole (ARIMIDEX) 1 MG tablet Take 1 mg by mouth daily.      Marland Kitchen ibuprofen (ADVIL,MOTRIN) 200 MG tablet Take 200 mg by mouth every 6 (six) hours as needed for pain.      . Multiple Vitamins-Minerals (MULTIVITAMIN WITH MINERALS) tablet Take 1 tablet by mouth daily.       No current facility-administered medications for this visit.    SURGICAL HISTORY:  Past Surgical History  Procedure Laterality Date  . Cesarean section  1984, 1986, 1988  . Oophorectomy  Right 1999, Left 2001  . Cholecystectomy  2010  . Breast lumpectomy with needle localization and axillary sentinel lymph node bx Left 08/31/2012    Procedure: left BREAST needle localized  LUMPECTOMY WITH left sentinel lymph node mapping ;  Surgeon: Clovis Pu. Cornett, MD;  Location: [facility];  Service: General;  Laterality: Left;  needle localization at breast center of [place] 7:30   . Breast surgery  08/31/12    ER+PR-HER-2neu-  . Breast biopsy  08/17/2012    REVIEW OF SYSTEMS:  Pertinent items are noted in HPI.   Health  Maintenance  Mammogram: 05/23/12 Colonoscopy: 2009 Bone Density Scan: 2013-normal Pap Smear: 05/2012 Eye Exam: 1.5 years ago Vitamin D Level: pending Lipid Panel: 05/2012    PHYSICAL EXAMINATION: Blood pressure 128/83, pulse 68, temperature 98.4 F (36.9 C), temperature source Oral, resp. rate 18, height 5\' 2"  (1.575 m), weight 164 lb 9.6 oz (74.662 kg). Body mass index is 30.1 kg/(m^2). General: Patient is a well appearing female in no acute distress HEENT: PERRLA, sclerae anicteric no conjunctival pallor, MMM Neck: supple, no palpable adenopathy Lungs: clear to auscultation bilaterally, no wheezes, rhonchi, or rales Cardiovascular: regular rate rhythm, S1, S2, no murmurs, rubs or gallops Abdomen: Soft, non-tender, non-distended, normoactive bowel sounds, no HSM Extremities: warm and well perfused, no clubbing, cyanosis, or edema Skin: No rashes or lesions Neuro: Non-focal Right breast: No masses nipple discharge or skin changes. Left breast: No masses nipple discharge well healed surgical scar no other skin changes ECOG PERFORMANCE STATUS: 0 - Asymptomatic      LABORATORY DATA: Lab Results  Component Value Date   WBC 6.9 04/18/2013   HGB 13.5 04/18/2013   HCT 39.5 04/18/2013   MCV 91.8 04/18/2013   PLT 321 04/18/2013      Chemistry      Component Value Date/Time   NA 141 04/18/2013 1350   K 3.9 04/18/2013 1350   CL 106 09/15/2012 1445   CO2 24 04/18/2013 1350   BUN 11.6 04/18/2013 1350   CREATININE 0.7 04/18/2013 1350      Component Value Date/Time   CALCIUM 9.3 04/18/2013 1350   ALKPHOS 60 04/18/2013 1350   AST 24 04/18/2013 1350   ALT 24 04/18/2013 1350   BILITOT 0.28 04/18/2013 1350     Diagnosis 1. Breast, lumpectomy, Left - INVASIVE DUCTAL CARCINOMA, SEE COMMENT. - INVASIVE CARCINOMA IS 0.4 CM FROM NEAREST MARGIN (INFERIOR). - DUCTAL CARCINOMA IN SITU WITH COMEDO NECROSIS AND NECROSIS AND CALCIFICATIONS. - SEE TUMOR TEMPLATE BELOW. 2. Lymph node,  sentinel, biopsy, Left axilla - ONE LYMPH NODE, NEGATIVE FOR TUMOR (0/1). 3. Lymph node, sentinel, biopsy, Left axilla - ONE LYMPH NODE, NEGATIVE FOR TUMOR (0/1). Microscopic Comment 1. BREAST, INVASIVE TUMOR, WITH LYMPH NODE SAMPLING Specimen, including laterality: Left breast Procedure: Lumpectomy Grade: II of III see comment Tubule formation: III Nuclear pleomorphism: II Mitotic:I Tumor size (gross measurement): See comment Margins: Invasive, distance to closest margin: 0.4 cm In-situ, distance to closest margin: 0.4 cm (inferior) If margin positive, focally or broadly: N/A Lymphovascular invasion: Absent Ductal carcinoma in situ: Present Grade: II - III Extensive intraductal component: Present Lobular neoplasia: Absent Tumor focality: Unifocal 1 of 3 Amended copy Amended FINAL for [place], Lupita Leash [phone]) Microscopic Comment(continued) Treatment effect: None If present, treatment effect in breast tissue, lymph nodes or both: N/A Extent of tumor: Skin: Microscopically negative Nipple: N/A Skeletal muscle: N/A Lymph nodes: # examined: 2 Lymph nodes with metastasis: 0 Breast prognostic profile: Estrogen receptor: Not repeated, previous study demonstrated 90% positivity ([id number]) Progesterone receptor: Not repeated, previous study demonstrated 0% positivity ([id number]) Her 2 neu: Repeated, previous study demonstrated no amplification (1.19) ([id number]) Ki-67: Not repeated, previous  study demonstrated 20% proliferation rate ([id number]) Non-neoplastic breast: Previous biopsy site and microcalcifications TNM: pT44mi, pN0, pMX see comment (CRR:caf 09/01/12) Comment: Although the majority of the carcinoma present is in situ, there are areas of microscopic invasion identified (pT41mi). These areas are confirmed by the absence of the myoepithelial layer (negative Calponin, p63 and smooth muscle mycin heavy chain immunostaining). Histologic grading is limited by the size of  the invasive tumor. (CRR:caf 09/04/12) AMENDMENT: Although there is microscopic invasive adenocarcinoma present spanning the 1.8 cm biopsy cavity, the invasive tumor is best staged as pT59mi. This was discussed with Dr. Luisa Hart on 09/15/12. (MM:gt, 09/15/12) [place]  RADIOGRAPHIC STUDIES:  No results found.  ASSESSMENT: 52 year old female with  #1 stage I (T62micN0) invasive ductal carcinoma with ductal carcinoma in situ, ER positive PR negative HER-2/neu negative. Patient is status post lumpectomy with sentinel lymph node biopsy the lymph node was negative for metastatic disease.  #2 patient is now status post adjuvant radiation therapy completed in May 2014.  #3 patient will begin adjuvant antiestrogen therapy with aroma Milligram daily. Risks benefits and side effects were discussed with the patient completely.   PLAN:   #1 Doing well, patient will continue arimidex.  We discussed her health maintenance above along with survivorship.  #2 We discussed the fatigue and I recommended it she give it another month or two.  She has had a stressful year and she needs to give her body some time to recuperate.  She is in agreement with this and will call us if she does not feel better.  We are getting a vitamin d level today, to further evaluate the fatigue.  #2 she will be seen back in 6 months time for followup or sooner if needed.  All questions were answered. The patient knows to call the clinic with any problems, questions or concerns. We can certainly see the patient much sooner if necessary.  I spent 25 minutes counseling the patient face to face. The total time spent in the appointment was 30 minutes.  Illa Level, NP Medical Oncology [facility] [phone] 04/20/2013, 8:39 AM    ATTENDING'S ATTESTATION:  I personally reviewed patient's chart, examined patient myself, formulated the treatment plan as followed.    Patient seems to be tolerating Arimidex  very nicely she is on this adjuvantly for curative intent. She understands the side effects risks and benefits of it. She will continue to see [place] every 6 months. Duration of therapy is 5 years.  Drue Second, MD Medical/Oncology [facility] [phone] (beeper) [phone] (Office)  04/26/2013, 6:32 PM

## 2013-04-18 NOTE — Telephone Encounter (Signed)
, °

## 2013-04-18 NOTE — Patient Instructions (Addendum)
You are doing well.  Continue Arimidex.  Please call us if you have any questions or concerns.

## 2013-05-21 ENCOUNTER — Ambulatory Visit (INDEPENDENT_AMBULATORY_CARE_PROVIDER_SITE_OTHER): Payer: 59 | Admitting: Surgery

## 2013-05-24 ENCOUNTER — Other Ambulatory Visit: Payer: Self-pay | Admitting: *Deleted

## 2013-05-24 DIAGNOSIS — C50912 Malignant neoplasm of unspecified site of left female breast: Secondary | ICD-10-CM

## 2013-05-24 MED ORDER — ANASTROZOLE 1 MG PO TABS: 1.0000 mg | ORAL_TABLET | Freq: Every day | ORAL | Status: DC

## 2013-06-12 ENCOUNTER — Ambulatory Visit (INDEPENDENT_AMBULATORY_CARE_PROVIDER_SITE_OTHER): Payer: 59 | Admitting: Surgery

## 2013-07-31 ENCOUNTER — Other Ambulatory Visit: Payer: Self-pay | Admitting: Family Medicine

## 2013-07-31 DIAGNOSIS — Z853 Personal history of malignant neoplasm of breast: Secondary | ICD-10-CM

## 2013-07-31 DIAGNOSIS — Z9889 Other specified postprocedural states: Secondary | ICD-10-CM

## 2013-08-24 ENCOUNTER — Ambulatory Visit
Admission: RE | Admit: 2013-08-24 | Discharge: 2013-08-24 | Disposition: A | Discharge status: Home / Self Care | Payer: 59 | Source: Ambulatory Visit | Attending: Family Medicine | Admitting: Family Medicine

## 2013-08-24 DIAGNOSIS — Z853 Personal history of malignant neoplasm of breast: Secondary | ICD-10-CM

## 2013-08-24 DIAGNOSIS — Z9889 Other specified postprocedural states: Secondary | ICD-10-CM

## 2013-09-24 ENCOUNTER — Other Ambulatory Visit: Payer: Self-pay | Admitting: Family Medicine

## 2013-09-24 DIAGNOSIS — N63 Unspecified lump in unspecified breast: Secondary | ICD-10-CM

## 2013-09-24 DIAGNOSIS — N644 Mastodynia: Secondary | ICD-10-CM

## 2013-09-25 ENCOUNTER — Other Ambulatory Visit: Payer: Self-pay | Admitting: Family Medicine

## 2013-09-25 ENCOUNTER — Ambulatory Visit
Admission: RE | Admit: 2013-09-25 | Discharge: 2013-09-25 | Disposition: A | Discharge status: Home / Self Care | Payer: 59 | Source: Ambulatory Visit | Attending: Family Medicine | Admitting: Family Medicine

## 2013-09-25 ENCOUNTER — Ambulatory Visit
Admission: RE | Admit: 2013-09-25 | Discharge: 2013-09-25 | Disposition: A | Discharge status: Home / Self Care | Payer: Self-pay | Source: Ambulatory Visit | Attending: Family Medicine | Admitting: Family Medicine

## 2013-09-25 DIAGNOSIS — N644 Mastodynia: Secondary | ICD-10-CM

## 2013-09-25 DIAGNOSIS — N63 Unspecified lump in unspecified breast: Secondary | ICD-10-CM

## 2013-09-26 ENCOUNTER — Other Ambulatory Visit: Payer: 59

## 2013-10-09 ENCOUNTER — Encounter (INDEPENDENT_AMBULATORY_CARE_PROVIDER_SITE_OTHER): Payer: Self-pay

## 2013-10-09 ENCOUNTER — Ambulatory Visit
Admission: RE | Admit: 2013-10-09 | Discharge: 2013-10-09 | Disposition: A | Discharge status: Home / Self Care | Payer: 59 | Source: Ambulatory Visit | Attending: Family Medicine | Admitting: Family Medicine

## 2013-10-09 DIAGNOSIS — N63 Unspecified lump in unspecified breast: Secondary | ICD-10-CM

## 2013-10-09 DIAGNOSIS — N644 Mastodynia: Secondary | ICD-10-CM

## 2013-10-18 ENCOUNTER — Telehealth: Payer: Self-pay | Admitting: Adult Health

## 2013-10-18 NOTE — Telephone Encounter (Signed)
, °

## 2013-10-19 ENCOUNTER — Other Ambulatory Visit: Payer: 59

## 2013-10-19 ENCOUNTER — Ambulatory Visit: Payer: 59 | Admitting: Adult Health

## 2013-10-23 ENCOUNTER — Telehealth: Payer: Self-pay | Admitting: Adult Health

## 2013-10-23 NOTE — Telephone Encounter (Signed)
m °

## 2013-10-24 ENCOUNTER — Other Ambulatory Visit: Payer: 59

## 2013-10-24 ENCOUNTER — Ambulatory Visit: Payer: 59 | Admitting: Adult Health

## 2013-11-06 ENCOUNTER — Other Ambulatory Visit: Payer: Self-pay

## 2013-11-06 DIAGNOSIS — C50119 Malignant neoplasm of central portion of unspecified female breast: Secondary | ICD-10-CM

## 2013-11-07 ENCOUNTER — Other Ambulatory Visit: Payer: 59

## 2013-11-07 ENCOUNTER — Ambulatory Visit: Payer: 59 | Admitting: Adult Health

## 2013-11-27 ENCOUNTER — Other Ambulatory Visit: Payer: 59

## 2013-11-27 ENCOUNTER — Ambulatory Visit: Payer: 59 | Admitting: Adult Health

## 2013-11-27 ENCOUNTER — Telehealth: Payer: Self-pay | Admitting: Adult Health

## 2014-03-25 ENCOUNTER — Ambulatory Visit
Admission: RE | Admit: 2014-03-25 | Discharge: 2014-03-25 | Disposition: A | Discharge status: Home / Self Care | Payer: 59 | Source: Ambulatory Visit | Attending: Family Medicine | Admitting: Family Medicine

## 2014-03-25 ENCOUNTER — Other Ambulatory Visit: Payer: Self-pay | Admitting: Family Medicine

## 2014-03-25 DIAGNOSIS — M545 Low back pain: Secondary | ICD-10-CM

## 2014-06-03 ENCOUNTER — Ambulatory Visit
Admission: RE | Admit: 2014-06-03 | Discharge: 2014-06-03 | Disposition: A | Discharge status: Home / Self Care | Payer: 59 | Source: Ambulatory Visit | Attending: Family Medicine | Admitting: Family Medicine

## 2014-06-03 ENCOUNTER — Other Ambulatory Visit: Payer: Self-pay | Admitting: Family Medicine

## 2014-06-03 DIAGNOSIS — R079 Chest pain, unspecified: Secondary | ICD-10-CM

## 2014-08-01 ENCOUNTER — Telehealth: Payer: Self-pay | Admitting: Hematology

## 2014-08-01 ENCOUNTER — Other Ambulatory Visit: Payer: Self-pay | Admitting: *Deleted

## 2014-08-01 ENCOUNTER — Other Ambulatory Visit: Payer: Self-pay | Admitting: Obstetrics & Gynecology

## 2014-08-01 ENCOUNTER — Other Ambulatory Visit (HOSPITAL_COMMUNITY)
Admission: RE | Admit: 2014-08-01 | Discharge: 2014-08-01 | Disposition: A | Discharge status: Home / Self Care | Payer: 59 | Source: Ambulatory Visit | Attending: Obstetrics & Gynecology | Admitting: Obstetrics & Gynecology

## 2014-08-01 ENCOUNTER — Telehealth: Payer: Self-pay | Admitting: Oncology

## 2014-08-01 DIAGNOSIS — Z1151 Encounter for screening for human papillomavirus (HPV): Secondary | ICD-10-CM | POA: Diagnosis present

## 2014-08-01 DIAGNOSIS — Z01419 Encounter for gynecological examination (general) (routine) without abnormal findings: Secondary | ICD-10-CM | POA: Diagnosis not present

## 2014-08-01 NOTE — Progress Notes (Signed)
This RN received message requesting a return call to Destiny [facility]rell with [facility]  Returned call to given cell number with no answer.  Call placed to [facility] office at [phone] and was able to speak with MD.  Destiny Harrell states pt was seen for routine care and has noted discomfort due to vaginal dryness, atrophy and dysparunia.  Destiny Harrell was wanting opinion of this office regarding use of low dose vagifem in this pt.  This RN discussed general breast MD recommendations per above but also informed [facility]rell pt has not been seen since Oct 2014 and need for follow up to approve above therapy.  Destiny Harrell stated she will not prescribed any localized estrogen until pt is seen by this office. " she is expecting a call from me and I will inform her to call your office "  This RN reviewed chart and noted pt was prescribed arimidex previously.  This RN called pt to discuss communication with Destiny Harrell and need for follow up in this office to best address her concerns related to her history of ER/PR positive breast cancer.  Of note per discussion, Destiny Harrell states she has stopped the arimidex.  Plan per discussion is to schedule pt to be seen asap for approriate care and follow up.  Pt is in agreement with above.  POF sent to scheduling.

## 2014-08-01 NOTE — Telephone Encounter (Signed)
per pof to sch pt as New pt -sch pt w/Feng-cld & spoke to pt and gave pt appt time & date-pt understood

## 2014-08-06 LAB — CYTOLOGY - PAP

## 2014-08-19 ENCOUNTER — Ambulatory Visit (HOSPITAL_BASED_OUTPATIENT_CLINIC_OR_DEPARTMENT_OTHER): Payer: 59 | Admitting: Hematology

## 2014-08-19 ENCOUNTER — Encounter: Payer: Self-pay | Admitting: Hematology

## 2014-08-19 ENCOUNTER — Telehealth: Payer: Self-pay | Admitting: Hematology

## 2014-08-19 VITALS — BP 129/83 | HR 77 | Temp 98.3°F | Resp 18 | Ht 62.0 in | Wt 168.4 lb

## 2014-08-19 DIAGNOSIS — Z853 Personal history of malignant neoplasm of breast: Secondary | ICD-10-CM

## 2014-08-19 DIAGNOSIS — C50912 Malignant neoplasm of unspecified site of left female breast: Secondary | ICD-10-CM

## 2014-08-19 NOTE — Telephone Encounter (Signed)
appts made and avs printed for pt  [facility]rell °

## 2014-08-19 NOTE — Progress Notes (Signed)
OFFICE PROGRESS NOTE  CC  Gara Kroner, MD [place], [address] [place] [address] Dr. Kyung Rudd  Dr. 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   STAGE:  Left Breast  TmicN0  ER+PR- Her2-  Clinical stage I  PRIOR THERAPY: #1The patient has a recent diagnosis of left-sided breast cancer and she has completed a lumpectomy. She initially was found to have some microcalcifications on screening mammography. 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 Receptor studies indicated that the tumor was ER positive, PR negative, and HER-2/neu negative.  The patient proceeded to undergo an MRI scan of the breasts bilaterally. There was a suspicious area of linear enhancement extending 3.5 cm anterior to the biopsy site. A 2 cm biopsy cavity was noted with clip placed slightly superiorly.   #2The patient was felt to be a good candidate for breast conservation treatment and she proceeded to undergo a lumpectomy on 3/ 13/2014. This returned positive for invasive carcinoma measuring 1.8 cm. 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 The invasive carcinoma was 0.4 cm from the nearest margin, as was the in situ disease as well. The margins were therefore negative. Lymphovascular space invasion was absent. The invasive disease was grade 2. The majority of the carcinoma was in situ disease but there were areas of microscopic invasion. Therefore this was staged as a T67mN0 tumor, with both of the sampled lymph nodes negative for carcinoma  #3 patient is now status post radiation therapy to the left breast administered by Dr. JKyung Rudd She received date 09/28/2012 through 11/14/2012  #4 patient will begin antiestrogen therapy with Arimidex 1 mg daily. Risks and benefits and side  effects of treatment and rationale were discussed with her completely. She was given information regarding the medicine as well. It is presented in the after visit summary. She will begin this 12/14/2012. A total of 5 years of therapy is planned.   CURRENT THERAPY:Arimidex 1 mg daily  INTERVAL HISTORY: DLEONI GOODNESS89y.o. female returns for followup. She was last seen by Dr. KHumphrey Rollsin Oct 2014. She continued Arimidex after that visit but stopped in May 2015 due to side effects. She had lots of muscular skeletal complains and felt depressed. Her mother was diagnosed with cancer and she was quite distressed also. She felt a lot better after she stopped Arimidex. She feels well overall, denies any significant pain, or other new symptoms. She has good appetite and Energy level, weight is stable.   MEDICAL HISTORY: Past Medical History  Diagnosis Date  . IBS (irritable bowel syndrome)   . Medical history non-contributory   . Breast cancer     ALLERGIES:  has No Known Allergies.  MEDICATIONS:  Current Outpatient Prescriptions  Medication Sig Dispense Refill  . atorvastatin (LIPITOR) 10 MG tablet Take 10 mg by mouth daily.    . meloxicam (MOBIC) 15 MG tablet Take 15 mg by mouth as needed.   1  . Vitamin D, Ergocalciferol, (DRISDOL) 50000 UNITS CAPS capsule Take 50,000 Units by mouth once a week.  0  . anastrozole (ARIMIDEX) 1 MG tablet Take 1 tablet (1 mg total) by mouth daily. (Patient not taking: Reported on 08/19/2014) 90 tablet 1  . ibuprofen (ADVIL,MOTRIN) 200 MG tablet Take 200 mg by mouth every 6 (six) hours  as needed for pain.    . Multiple Vitamins-Minerals (MULTIVITAMIN WITH MINERALS) tablet Take 1 tablet by mouth daily.     No current facility-administered medications for this visit.    SURGICAL HISTORY:  Past Surgical History  Procedure Laterality Date  . Cesarean section  1984, 1986, 1988  . Oophorectomy  Right 1999, Left 2001  . Cholecystectomy  2010  . Breast lumpectomy  with needle localization and axillary sentinel lymph node bx Left 08/31/2012    Procedure: left BREAST needle localized  LUMPECTOMY WITH left sentinel lymph node mapping ;  Surgeon: Joyice Faster. Cornett, MD;  Location: [facility];  Service: General;  Laterality: Left;  needle localization at breast center of [place] 7:30   . Breast surgery  08/31/12    ER+PR-HER-2neu-  . Breast biopsy  08/17/2012    REVIEW OF SYSTEMS:  Pertinent items are noted in HPI.   Health Maintenance  Mammogram: 09/25/2013 Colonoscopy: 2009 Bone Density Scan: 2013-normal Pap Smear: 07/2014 Eye Exam: 1.5 years ago Vitamin D Level: pending Lipid Panel: 05/2012    PHYSICAL EXAMINATION: Blood pressure 129/83, pulse 77, temperature 98.3 F (36.8 C), temperature source Oral, resp. rate 18, height '5\' 2"'  (1.575 m), weight 168 lb 6.4 oz (76.386 kg). Body mass index is 30.79 kg/(m^2). General: Patient is a well appearing female in no acute distress HEENT: PERRLA, sclerae anicteric no conjunctival pallor, MMM Neck: supple, no palpable adenopathy Lungs: clear to auscultation bilaterally, no wheezes, rhonchi, or rales Cardiovascular: regular rate rhythm, S1, S2, no murmurs, rubs or gallops Abdomen: Soft, non-tender, non-distended, normoactive bowel sounds, no HSM Extremities: warm and well perfused, no clubbing, cyanosis, or edema Skin: No rashes or lesions Neuro: Non-focal Right breast: No masses nipple discharge or skin changes. Left breast: No masses nipple discharge well healed surgical scar no other skin changes  ECOG PERFORMANCE STATUS: 0 - Asymptomatic      LABORATORY DATA: Lab Results  Component Value Date   WBC 6.9 04/18/2013   HGB 13.5 04/18/2013   HCT 39.5 04/18/2013   MCV 91.8 04/18/2013   PLT 321 04/18/2013      Chemistry      Component Value Date/Time   NA 141 04/18/2013 1350   K 3.9 04/18/2013 1350   CL 106 09/15/2012 1445   CO2 24 04/18/2013 1350   BUN 11.6 04/18/2013 1350    CREATININE 0.7 04/18/2013 1350      Component Value Date/Time   CALCIUM 9.3 04/18/2013 1350   ALKPHOS 60 04/18/2013 1350   AST 24 04/18/2013 1350   ALT 24 04/18/2013 1350   BILITOT 0.28 04/18/2013 1350     Diagnosis 1. Breast, lumpectomy, Left - INVASIVE DUCTAL CARCINOMA, SEE COMMENT. - INVASIVE CARCINOMA IS 0.4 CM FROM NEAREST MARGIN (INFERIOR). - DUCTAL CARCINOMA IN SITU WITH COMEDO NECROSIS AND NECROSIS AND CALCIFICATIONS. - SEE TUMOR TEMPLATE BELOW. 2. Lymph node, sentinel, biopsy, Left axilla - ONE LYMPH NODE, NEGATIVE FOR TUMOR (0/1). 3. Lymph node, sentinel, biopsy, Left axilla - ONE LYMPH NODE, NEGATIVE FOR TUMOR (0/1). Microscopic Comment 1. BREAST, INVASIVE TUMOR, WITH LYMPH NODE SAMPLING Specimen, including laterality: Left breast Procedure: Lumpectomy Grade: II of III see comment Tubule formation: III Nuclear pleomorphism: II Mitotic:I Tumor size (gross measurement): See comment Margins: Invasive, distance to closest margin: 0.4 cm In-situ, distance to closest margin: 0.4 cm (inferior) If margin positive, focally or broadly: N/A Lymphovascular invasion: Absent Ductal carcinoma in situ: Present Grade: II - III Extensive intraductal component: Present Lobular neoplasia: Absent  Tumor focality: Unifocal 1 of 3 Amended copy Amended FINAL for [place], [place] 201 095 3742.1) Microscopic Comment(continued) Treatment effect: None If present, treatment effect in breast tissue, lymph nodes or both: N/A Extent of tumor: Skin: Microscopically negative Nipple: N/A Skeletal muscle: N/A Lymph nodes: # examined: 2 Lymph nodes with metastasis: 0 Breast prognostic profile: Estrogen receptor: Not repeated, previous study demonstrated 90% positivity ([id number]) Progesterone receptor: Not repeated, previous study demonstrated 0% positivity ([id number]) Her 2 neu: Repeated, previous study demonstrated no amplification (1.19) ([id number]) Ki-67: Not repeated, previous study  demonstrated 20% proliferation rate ([id number]) Non-neoplastic breast: Previous biopsy site and microcalcifications TNM: pT74m, pN0, pMX see comment (CRR:caf 09/01/12) Comment: Although the majority of the carcinoma present is in situ, there are areas of microscopic invasion identified (pT133m. These areas are confirmed by the absence of the myoepithelial layer (negative Calponin, p63 and smooth muscle mycin heavy chain immunostaining). Histologic grading is limited by the size of the invasive tumor. (CRR:caf 09/04/12) AMENDMENT: Although there is microscopic invasive adenocarcinoma present spanning the 1.8 cm biopsy cavity, the invasive tumor is best staged as pT1m24mThis was discussed with Dr. CorBrantley Stage 09/15/12. (MM:gt, 09/15/12) CHAMaliADIOGRAPHIC STUDIES:  No results found.  ASSESSMENT AND PLAN: [age]ar old female with  1. stage I (T1mi82m) invasive ductal carcinoma with ductal carcinoma in situ, ER positive PR negative HER-2/neu negative.  -She is clinically doing well, exam and last mammogram showed no evidence of recurrence. -She unfortunately stopped adjuvant Arimidex after one year of therapy due to side effects. I encouraged her to consider Aromasin, whose side effects could be slightly different from Arimidex. Side effects reviewed with her and written material was provided to her today. -She will think about it and let me know if she would like to try Aromasin. -Continue yearly screening mammogram. She is due next months. Orders given. -I encouraged her to take calcium and vitamin D for bone health. -We discussed healthy lifestyle to reduce her risk of breast cancer, such as healthy diet and regular exercise. -She will repeat bone density scan at her primary care physician office.  Follow-up: I'll see her back in 6 months with a physical exam. No lab. If she is willing to try Aromasin, she will call me and I'll see her back in 2 months.   I spent 20 minutes counseling the  patient face to face. The total time spent in the appointment was 25 minutes.  FengTruitt Merle29/2016

## 2014-08-26 ENCOUNTER — Ambulatory Visit
Admission: RE | Admit: 2014-08-26 | Discharge: 2014-08-26 | Disposition: A | Discharge status: Home / Self Care | Payer: 59 | Source: Ambulatory Visit | Attending: Hematology | Admitting: Hematology

## 2014-08-26 DIAGNOSIS — C50912 Malignant neoplasm of unspecified site of left female breast: Secondary | ICD-10-CM

## 2014-10-18 ENCOUNTER — Ambulatory Visit
Admission: RE | Admit: 2014-10-18 | Discharge: 2014-10-18 | Disposition: A | Discharge status: Home / Self Care | Payer: 59 | Source: Ambulatory Visit | Attending: Family Medicine | Admitting: Family Medicine

## 2014-10-18 ENCOUNTER — Other Ambulatory Visit: Payer: Self-pay | Admitting: Family Medicine

## 2014-10-18 DIAGNOSIS — M25552 Pain in left hip: Secondary | ICD-10-CM

## 2015-02-18 ENCOUNTER — Encounter: Payer: Self-pay | Admitting: Hematology

## 2015-02-18 ENCOUNTER — Encounter: Payer: 59 | Admitting: Hematology

## 2015-02-18 ENCOUNTER — Other Ambulatory Visit: Payer: Self-pay | Admitting: *Deleted

## 2015-02-18 NOTE — Progress Notes (Signed)
No show  This encounter was created in error - please disregard.

## 2015-02-19 ENCOUNTER — Telehealth: Payer: Self-pay | Admitting: Hematology

## 2015-02-19 NOTE — Telephone Encounter (Signed)
Called and left a message with an appointment and mailed a calendar as well

## 2015-03-11 ENCOUNTER — Other Ambulatory Visit: Payer: Self-pay | Admitting: *Deleted

## 2015-03-11 ENCOUNTER — Encounter: Payer: Self-pay | Admitting: Hematology

## 2015-03-11 ENCOUNTER — Encounter: Payer: 59 | Admitting: Hematology

## 2015-03-11 NOTE — Progress Notes (Signed)
This encounter was created in error - please disregard.

## 2015-03-12 ENCOUNTER — Telehealth: Payer: Self-pay | Admitting: Hematology

## 2015-03-12 NOTE — Telephone Encounter (Signed)
per pof to r/s appt-cld pt to get a better time to sch since missed last 2 appts-adv to call [phone]

## 2015-08-18 ENCOUNTER — Other Ambulatory Visit: Payer: Self-pay | Admitting: Family Medicine

## 2015-08-18 DIAGNOSIS — Z853 Personal history of malignant neoplasm of breast: Secondary | ICD-10-CM

## 2015-08-28 ENCOUNTER — Ambulatory Visit
Admission: RE | Admit: 2015-08-28 | Discharge: 2015-08-28 | Disposition: A | Discharge status: Home / Self Care | Payer: 59 | Source: Ambulatory Visit | Attending: Family Medicine | Admitting: Family Medicine

## 2015-08-28 DIAGNOSIS — Z853 Personal history of malignant neoplasm of breast: Secondary | ICD-10-CM

## 2016-02-12 IMAGING — US US BREAST*L* LIMITED INC AXILLA
1 series · 9 of 9 positions shown · non-contrast
Comparison: 08/24/2013, 08/31/2012, 08/16/2012, 05/23/2012.

CLINICAL DATA: History of malignant lumpectomy of the left breast
in August 2012. The patient completed left breast radiation therapy
in October 2012. Over the past several days patient developed acute
swelling with redness and pain associated with the left breast. She
was seen by her general practitioner and placed on antibiotics
(Augmentin) last night and referred to our [REDACTED].

EXAM:
DIGITAL DIAGNOSTIC  left breast MAMMOGRAM WITH CAD
ULTRASOUND left BREAST

[Series 1: us breast*left* limited inc axilla · 9 of 9 slices shown]
[im 1/9]
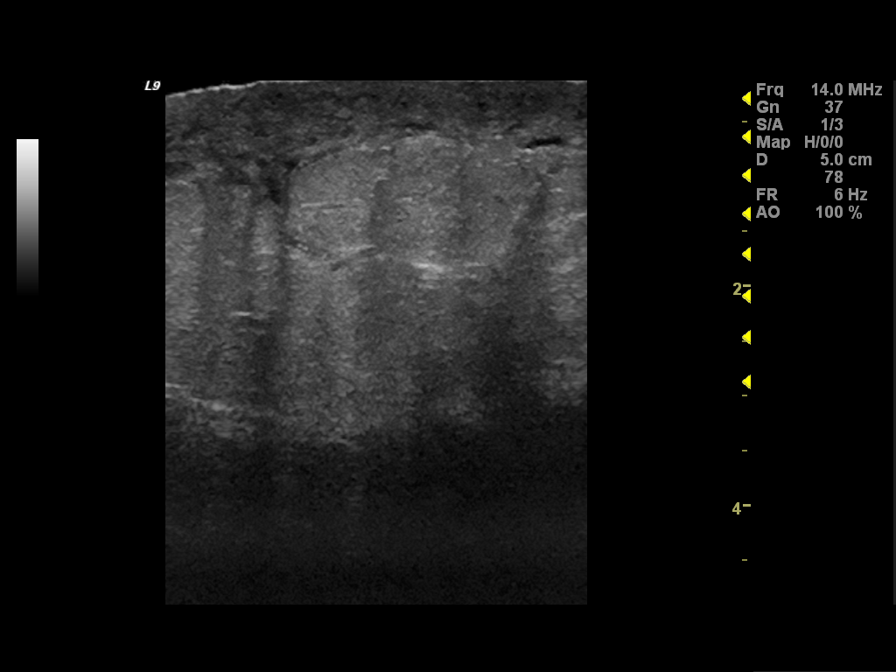
[im 2/9]
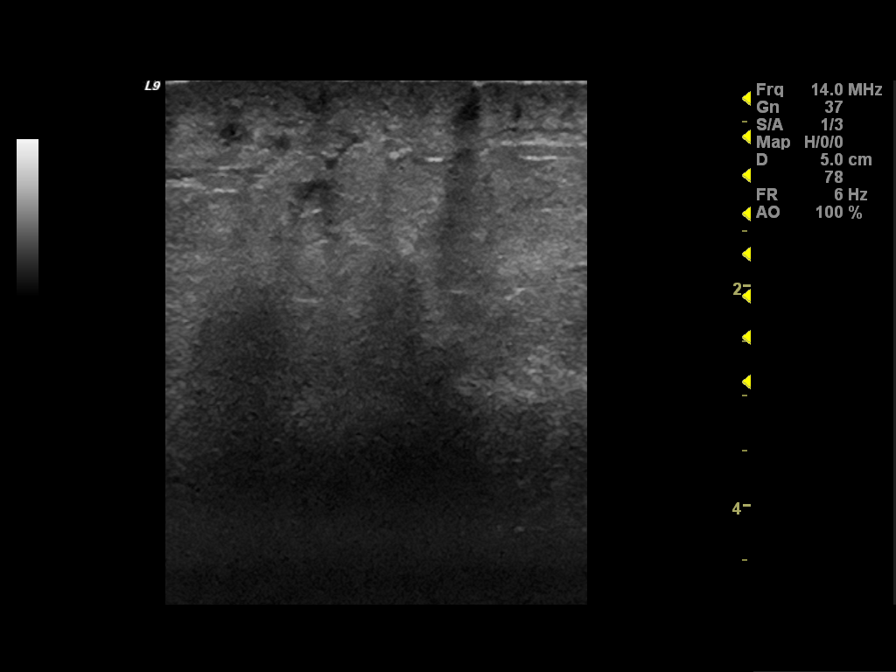
[im 3/9]
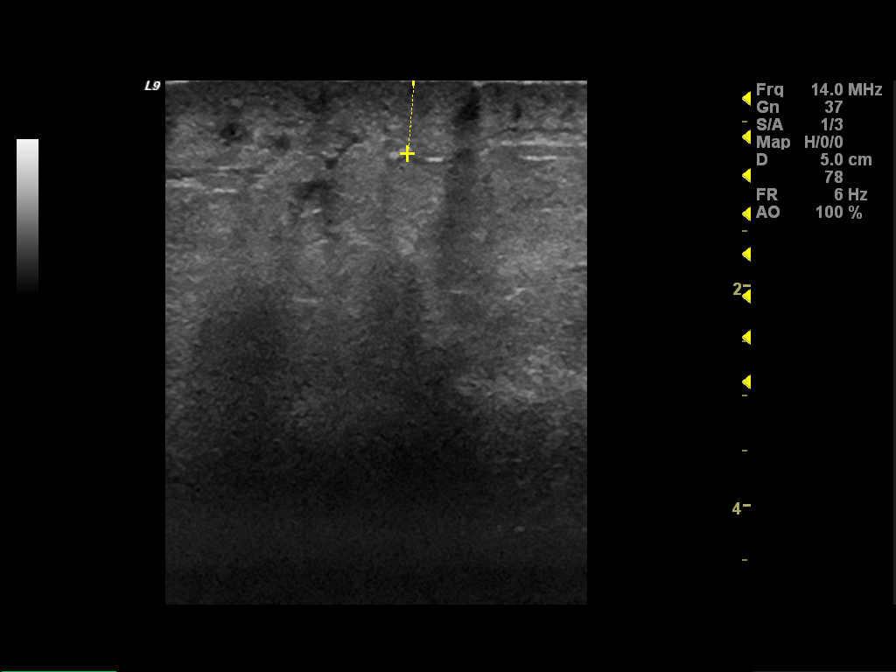
[im 4/9]
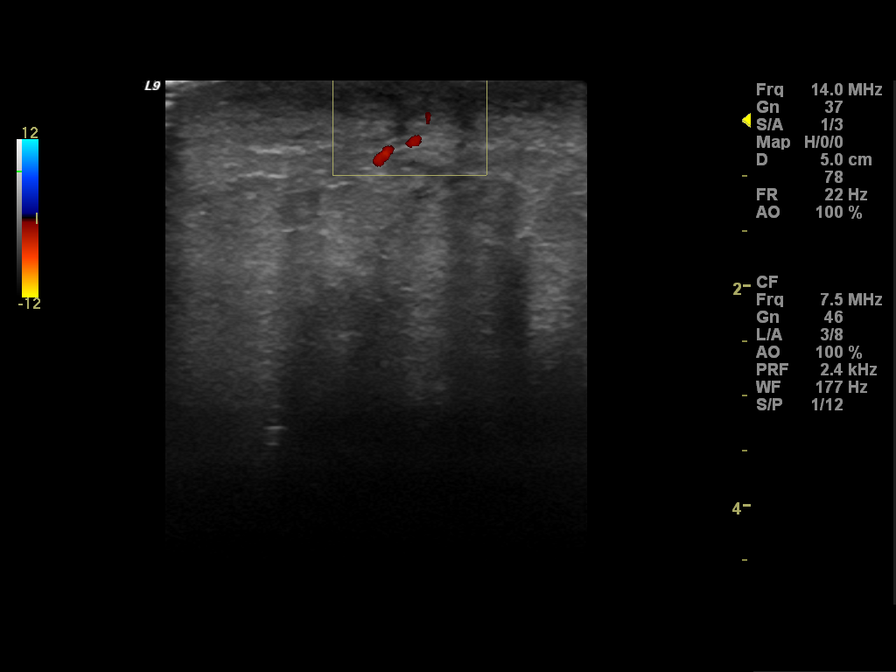
[im 5/9]
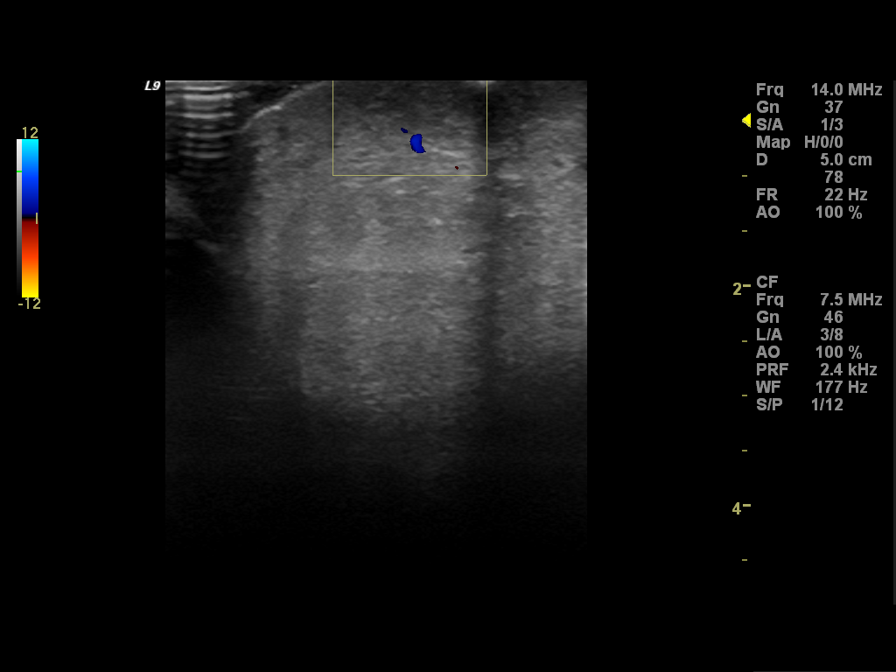
[im 6/9]
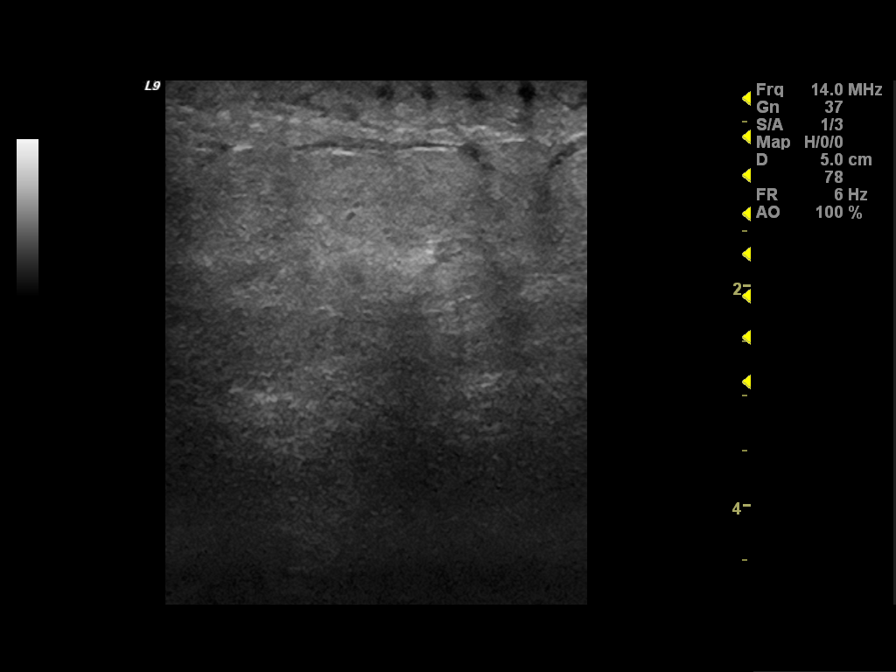
[im 7/9]
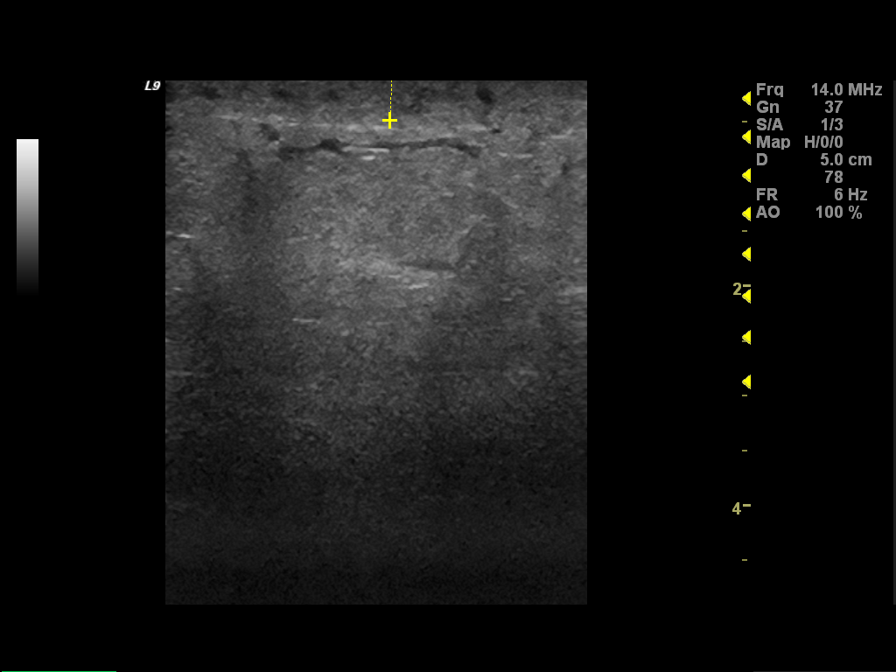
[im 8/9]
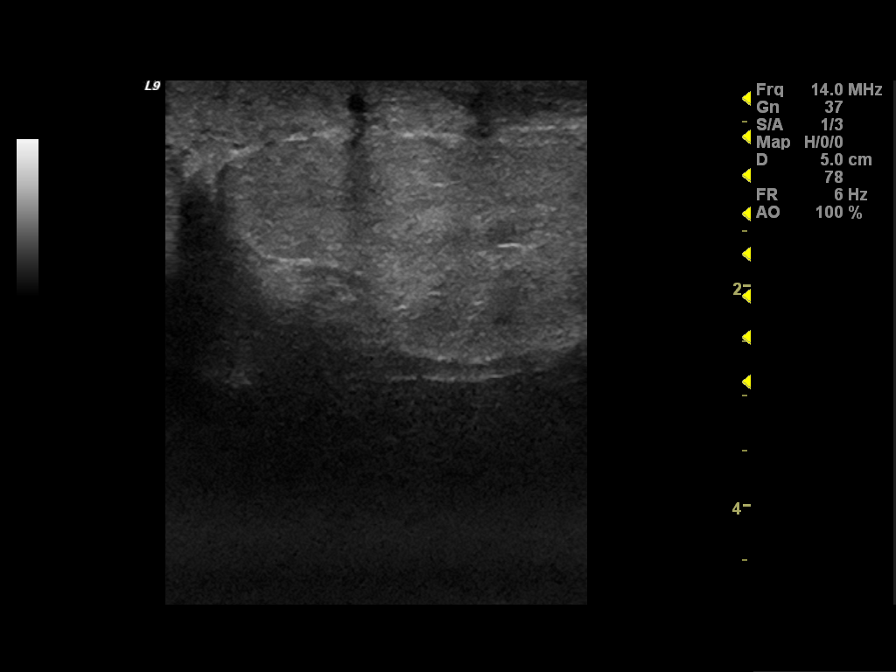
[im 9/9]
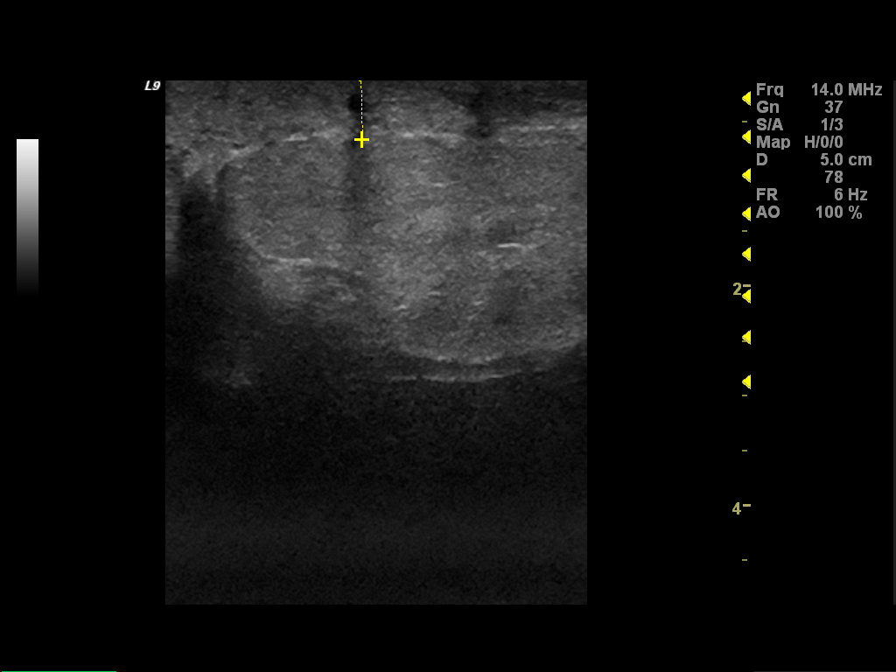

[9 of 9 positions shown; findings below may reference images not displayed]

ACR Breast Density Category c: The breast tissue is heterogeneously
dense, which may obscure small masses.
FINDINGS: There is increased skin thickening associated with the left breast
particularly medially located. There is mild increase in the
trabecular pattern of the breast. There is no mass, distortion, or
worrisome calcification.

Mammographic images were processed with CAD.

On physical exam, there is diffuse skin thickening with mild
erythema and tenderness. The skin thickening is more prominent
medially and inferiorly.

Ultrasound is performed, showing marked skin thickening present
within the left breast especially medially located. The skin
thickness measures 7 mm at the 9 o'clock position 4 cm from nipple.
There is increased vascularity present, as well. There is no
evidence for a breast abscess this time.
IMPRESSION: The findings are consistent with developing mastitis. Recommend
follow-up left breast physical examination with ultrasound in 2
weeks (following antibiotic therapy). Patient was instructed to
contact her referring physician if she develops worsening pain,
swelling, fever or redness associated with the left breast.

RECOMMENDATION:
Left breast ultrasound with physical examination in 2 weeks.

I have discussed the findings and recommendations with the patient.
Results were also provided in writing at the conclusion of the
visit. If applicable, a reminder letter will be sent to the patient
regarding the next appointment.

BI-RADS CATEGORY  3: Probably benign finding(s) - short interval
follow-up suggested.

## 2016-07-29 ENCOUNTER — Other Ambulatory Visit: Payer: Self-pay | Admitting: Family Medicine

## 2016-07-29 DIAGNOSIS — Z853 Personal history of malignant neoplasm of breast: Secondary | ICD-10-CM

## 2016-09-15 ENCOUNTER — Ambulatory Visit
Admission: RE | Admit: 2016-09-15 | Discharge: 2016-09-15 | Disposition: A | Discharge status: Home / Self Care | Payer: 59 | Source: Ambulatory Visit | Attending: Family Medicine | Admitting: Family Medicine

## 2016-09-15 DIAGNOSIS — Z853 Personal history of malignant neoplasm of breast: Secondary | ICD-10-CM

## 2017-03-06 IMAGING — CR DG LUMBAR SPINE 2-3V
3 series · 3 of 3 positions shown · non-contrast
Comparison: 03/25/2014

CLINICAL DATA: Low back pain

EXAM:
LUMBAR SPINE - 2-3 VIEW

[t l-spine a.p.]
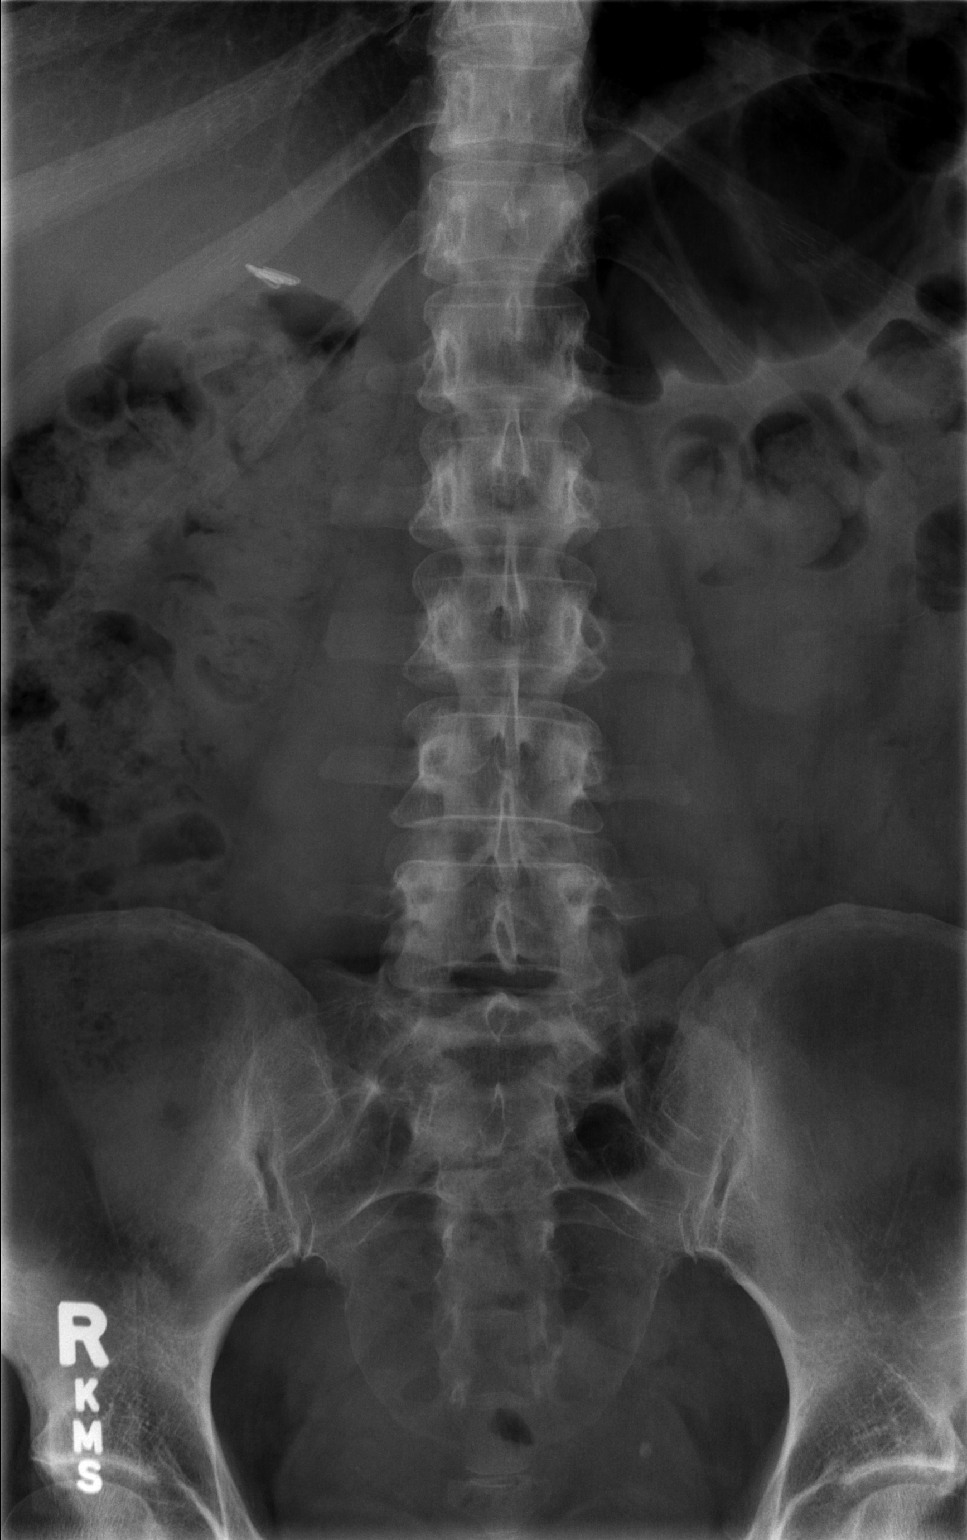

[t l-spine lat]
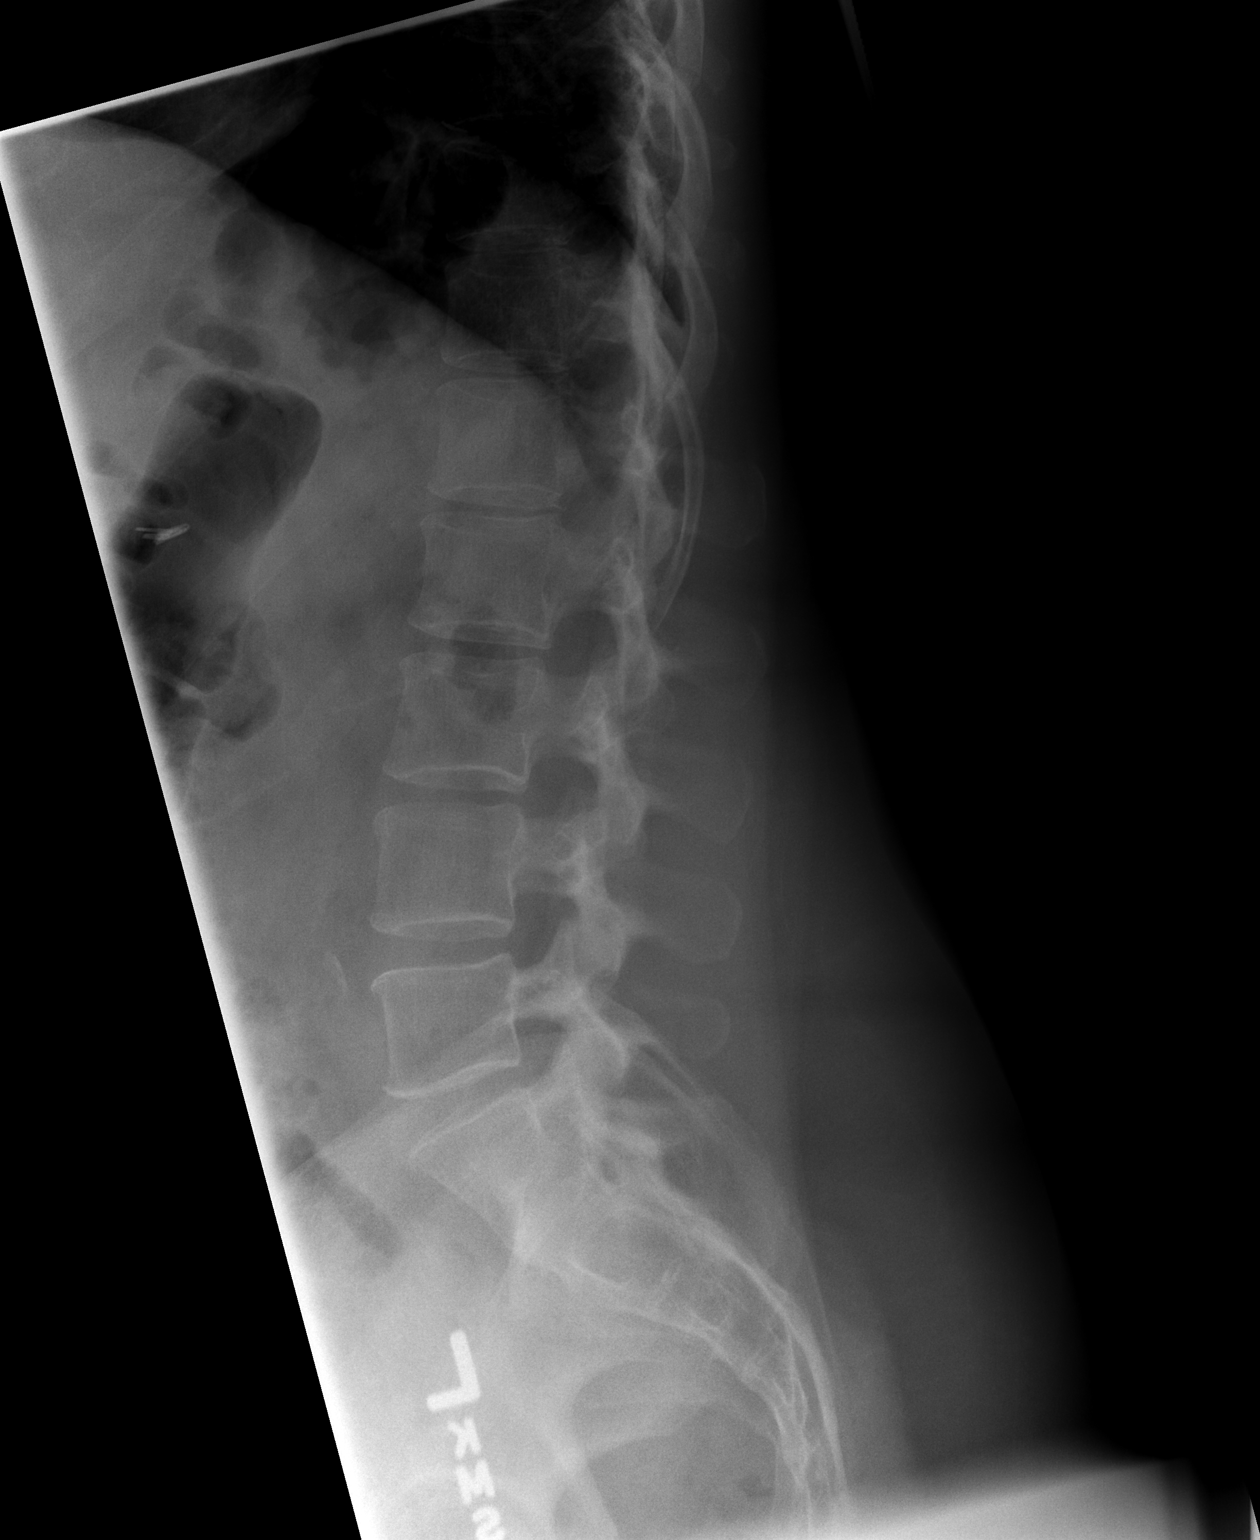

[t l-spine l5-s1 spot]
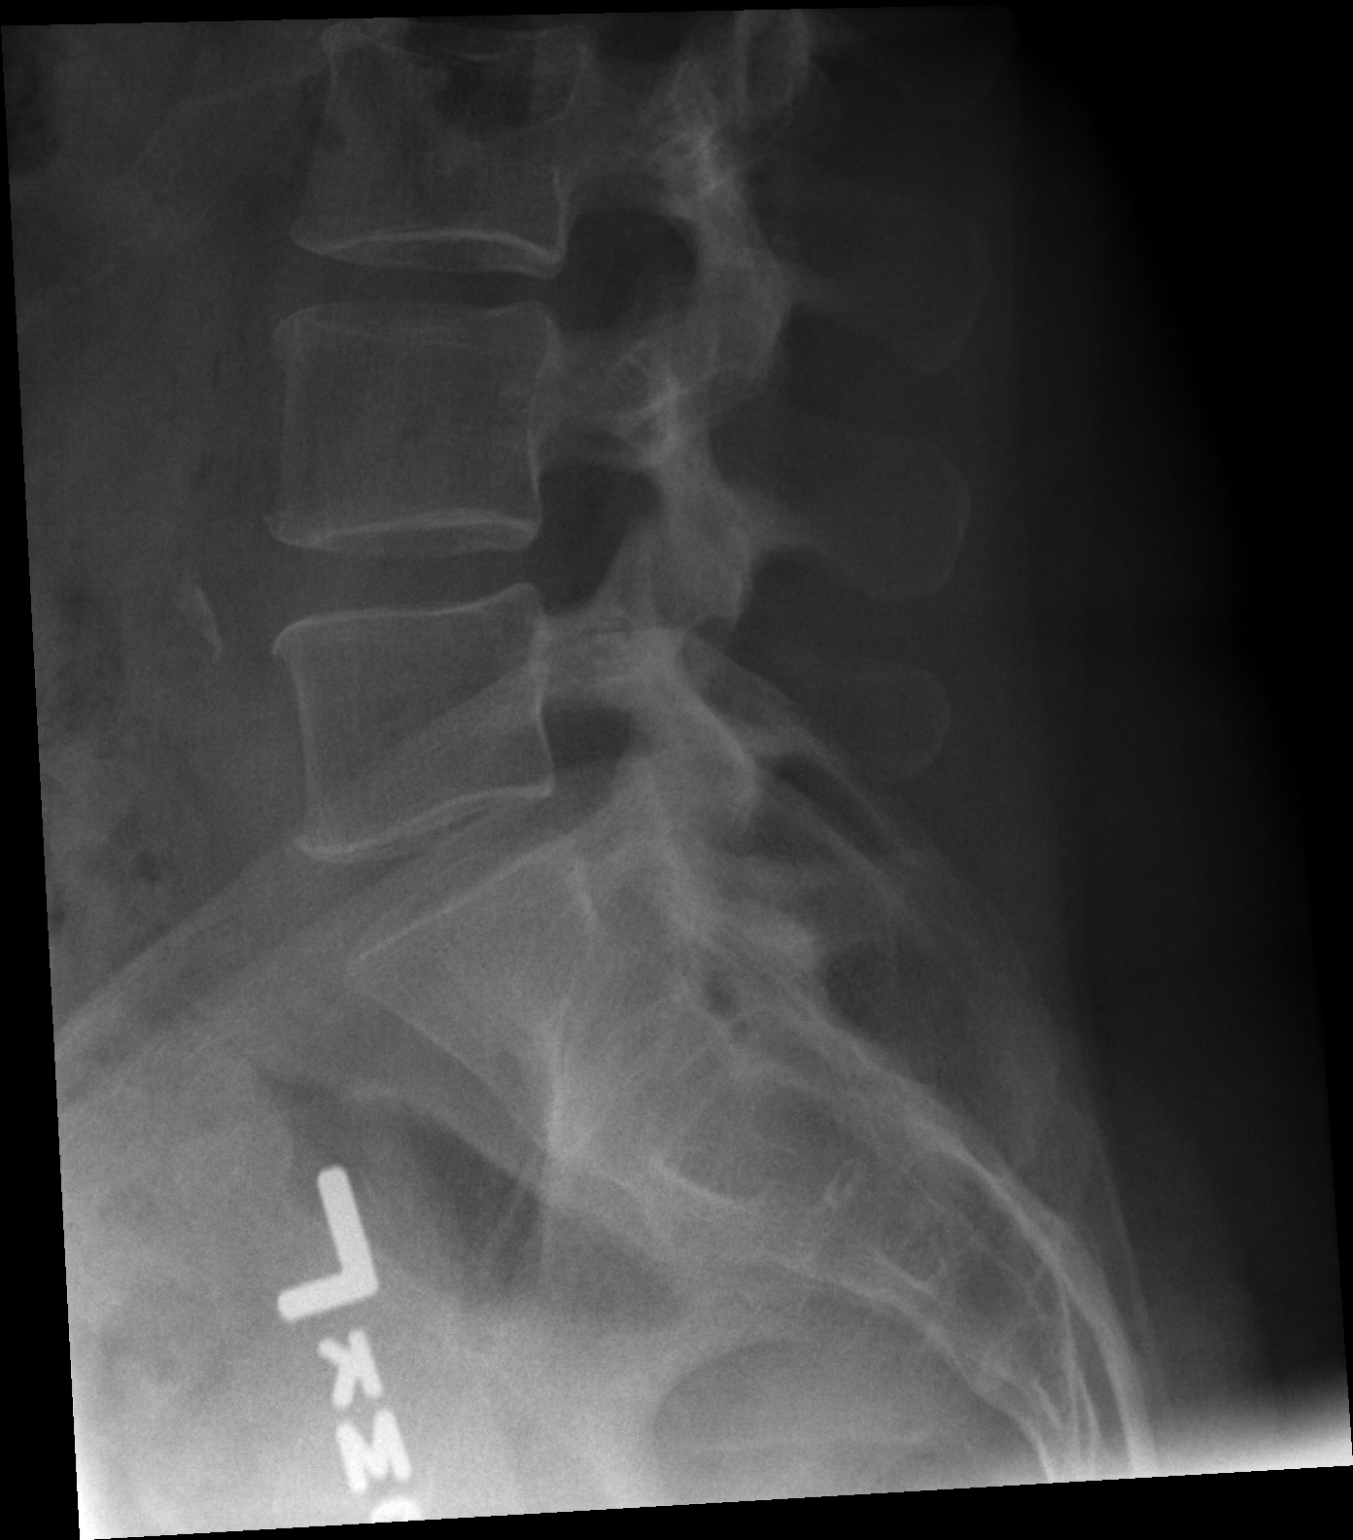

[3 of 3 positions shown; findings below may reference images not displayed]

FINDINGS: There is no evidence of lumbar spine fracture. Alignment is normal.
Intervertebral disc spaces are maintained.
IMPRESSION: Negative.

## 2017-08-23 ENCOUNTER — Other Ambulatory Visit: Payer: Self-pay | Admitting: Family Medicine

## 2017-08-23 DIAGNOSIS — Z853 Personal history of malignant neoplasm of breast: Secondary | ICD-10-CM

## 2017-09-19 ENCOUNTER — Ambulatory Visit
Admission: RE | Admit: 2017-09-19 | Discharge: 2017-09-19 | Disposition: A | Discharge status: Home / Self Care | Payer: 59 | Source: Ambulatory Visit | Attending: Family Medicine | Admitting: Family Medicine

## 2017-09-19 DIAGNOSIS — Z853 Personal history of malignant neoplasm of breast: Secondary | ICD-10-CM

## 2018-09-15 ENCOUNTER — Other Ambulatory Visit: Payer: Self-pay | Admitting: Family Medicine

## 2018-09-15 DIAGNOSIS — Z1231 Encounter for screening mammogram for malignant neoplasm of breast: Secondary | ICD-10-CM

## 2018-11-06 ENCOUNTER — Ambulatory Visit: Payer: 59

## 2018-12-02 ENCOUNTER — Ambulatory Visit: Payer: 59

## 2019-01-12 ENCOUNTER — Other Ambulatory Visit: Payer: Self-pay

## 2019-01-12 ENCOUNTER — Ambulatory Visit
Admission: RE | Admit: 2019-01-12 | Discharge: 2019-01-12 | Disposition: A | Discharge status: Home / Self Care | Payer: No Typology Code available for payment source | Source: Ambulatory Visit | Attending: Family Medicine | Admitting: Family Medicine

## 2019-01-12 ENCOUNTER — Other Ambulatory Visit: Payer: Self-pay | Admitting: Family Medicine

## 2019-01-12 DIAGNOSIS — Z1231 Encounter for screening mammogram for malignant neoplasm of breast: Secondary | ICD-10-CM

## 2019-04-04 ENCOUNTER — Encounter: Payer: Self-pay | Admitting: Nurse Practitioner

## 2019-04-04 ENCOUNTER — Other Ambulatory Visit: Payer: Self-pay

## 2019-04-04 ENCOUNTER — Encounter: Payer: Self-pay | Admitting: Family Medicine

## 2019-04-04 ENCOUNTER — Ambulatory Visit (INDEPENDENT_AMBULATORY_CARE_PROVIDER_SITE_OTHER): Payer: No Typology Code available for payment source | Admitting: Family Medicine

## 2019-04-04 VITALS — BP 110/64 | HR 62 | Temp 98.0°F | Ht 62.5 in | Wt 170.8 lb

## 2019-04-04 DIAGNOSIS — K581 Irritable bowel syndrome with constipation: Secondary | ICD-10-CM

## 2019-04-04 DIAGNOSIS — E6609 Other obesity due to excess calories: Secondary | ICD-10-CM | POA: Diagnosis not present

## 2019-04-04 DIAGNOSIS — Z1211 Encounter for screening for malignant neoplasm of colon: Secondary | ICD-10-CM | POA: Diagnosis not present

## 2019-04-04 DIAGNOSIS — K5901 Slow transit constipation: Secondary | ICD-10-CM | POA: Diagnosis not present

## 2019-04-04 DIAGNOSIS — Z7689 Persons encountering health services in other specified circumstances: Secondary | ICD-10-CM | POA: Diagnosis not present

## 2019-04-04 DIAGNOSIS — E7841 Elevated Lipoprotein(a): Secondary | ICD-10-CM

## 2019-04-04 DIAGNOSIS — Z1231 Encounter for screening mammogram for malignant neoplasm of breast: Secondary | ICD-10-CM

## 2019-04-04 DIAGNOSIS — M25561 Pain in right knee: Secondary | ICD-10-CM

## 2019-04-04 DIAGNOSIS — Z6830 Body mass index (BMI) 30.0-30.9, adult: Secondary | ICD-10-CM

## 2019-04-04 DIAGNOSIS — Z23 Encounter for immunization: Secondary | ICD-10-CM | POA: Diagnosis not present

## 2019-04-04 DIAGNOSIS — G8929 Other chronic pain: Secondary | ICD-10-CM

## 2019-04-04 LAB — VITAMIN D 25 HYDROXY (VIT D DEFICIENCY, FRACTURES): VITD: 24.71 ng/mL — ABNORMAL LOW (ref 30.00–100.00)

## 2019-04-04 LAB — LIPID PANEL
Cholesterol: 255 mg/dL — ABNORMAL HIGH (ref 0–200)
HDL: 59.1 mg/dL (ref >39.00)
LDL Cholesterol: 167 mg/dL — ABNORMAL HIGH (ref 0–99)
NonHDL: 195.52
Total CHOL/HDL Ratio: 4
Triglycerides: 145 mg/dL (ref 0.0–149.0)
VLDL: 29 mg/dL (ref 0.0–40.0)

## 2019-04-04 LAB — COMPREHENSIVE METABOLIC PANEL
ALT: 25 U/L (ref 0–35)
AST: 23 U/L (ref 0–37)
Albumin: 4.2 g/dL (ref 3.5–5.2)
Alkaline Phosphatase: 63 U/L (ref 39–117)
BUN: 12 mg/dL (ref 6–23)
CO2: 29 mEq/L (ref 19–32)
Calcium: 9.8 mg/dL (ref 8.4–10.5)
Chloride: 104 mEq/L (ref 96–112)
Creatinine, Ser: 0.7 mg/dL (ref 0.40–1.20)
GFR: 86.14 mL/min (ref >60.00)
Glucose, Bld: 87 mg/dL (ref 70–99)
Potassium: 4.9 mEq/L (ref 3.5–5.1)
Sodium: 140 mEq/L (ref 135–145)
Total Bilirubin: 0.4 mg/dL (ref 0.2–1.2)
Total Protein: 6.9 g/dL (ref 6.0–8.3)

## 2019-04-04 LAB — CBC WITH DIFFERENTIAL/PLATELET
Basophils Absolute: 0.1 10*3/uL (ref 0.0–0.1)
Basophils Relative: 1 % (ref 0.0–3.0)
Eosinophils Absolute: 0.1 10*3/uL (ref 0.0–0.7)
Eosinophils Relative: 1.4 % (ref 0.0–5.0)
HCT: 41.8 % (ref 36.0–46.0)
Hemoglobin: 14.2 g/dL (ref 12.0–15.0)
Lymphocytes Relative: 33 % (ref 12.0–46.0)
Lymphs Abs: 2.5 10*3/uL (ref 0.7–4.0)
MCHC: 33.9 g/dL (ref 30.0–36.0)
MCV: 93.3 fl (ref 78.0–100.0)
Monocytes Absolute: 0.4 10*3/uL (ref 0.1–1.0)
Monocytes Relative: 5.5 % (ref 3.0–12.0)
Neutro Abs: 4.4 10*3/uL (ref 1.4–7.7)
Neutrophils Relative %: 59.1 % (ref 43.0–77.0)
Platelets: 379 10*3/uL (ref 150.0–400.0)
RBC: 4.48 Mil/uL (ref 3.87–5.11)
RDW: 13.1 % (ref 11.5–15.5)
WBC: 7.5 10*3/uL (ref 4.0–10.5)

## 2019-04-04 LAB — TSH: TSH: 1.35 u[IU]/mL (ref 0.35–4.50)

## 2019-04-04 NOTE — Patient Instructions (Signed)
Good to meet you today  Please schedule a complete physical for 3-4 months for Pap/ exam.

## 2019-04-04 NOTE — Progress Notes (Signed)
Subjective:    Patient ID: Destiny Harrell, female    DOB: 03/14/1961, 58 y.o.   MRN: [id number]  HPI This is a 58 yo female who presents today to establish care. She lives alone. She works as an Glass blower/designer. She enjoys her work. Enjoys quilting, outdoors. Has 3 grown children. One local, one in [place], [place], one in [place].   Last CPE- several years ago Mammo- 09/2017, was told her insurance may not cover imaging at the [facility] Pap- several years Colonoscopy- 2009, was in [place], normal per patient Tdap-unknown, will have today Flu- annual, will have today Eye- regular Dental-regular Exercise- loves to walk but hasn't been lately. Dog passed away last year.   IBS-C- takes frequent laxatives to have bm.  Longstanding. Has tried Linzess, no relief. Currently with bm several times a week with laxative only. BM not hard. Has tried Miralax daily for 1 week without improvement.   Tobacco abuse-not interested in quitting at this point.   Right knee- fell on it about a year ago. Occasional pain on knee cap. No radiation, no weakness, no instability.    Past Medical History:  Diagnosis Date  . Breast cancer ([place])   . Depression   . Hyperlipemia   . IBS (irritable bowel syndrome)   . Medical history non-contributory   . Migraines    Past Surgical History:  Procedure Laterality Date  . BREAST BIOPSY  08/16/2012  . BREAST LUMPECTOMY Left 08/31/2012  . BREAST LUMPECTOMY WITH NEEDLE LOCALIZATION AND AXILLARY SENTINEL LYMPH NODE BX Left 08/31/2012   Procedure: left BREAST needle localized  LUMPECTOMY WITH left sentinel lymph node mapping ;  Surgeon: Joyice Faster. Cornett, MD;  Location: [facility];  Service: General;  Laterality: Left;  needle localization at breast center of [place] 7:30   . BREAST SURGERY  08/31/12   ER+PR-HER-2neu-  . [place]  . CHOLECYSTECTOMY  2010  . OOPHORECTOMY  Right 1999, Left 2001   Family History  Problem Relation  Age of Onset  . Cancer Mother        Breast Cancer  . Breast cancer Mother 43  . Arthritis Mother   . Hypertension Mother   . Cancer Father        Hodgkin Lymphoma  . Arthritis Sister   . Hyperlipidemia Sister   . Depression Brother    Social History   Tobacco Use  . Smoking status: Current Every Day Smoker    Packs/day: 0.25    Years: 30.00    Pack years: 7.50    Types: Cigarettes    Start date: 04/03/1989  . Smokeless tobacco: Never Used  . Tobacco comment: 4 cigarettes per day   Substance Use Topics  . Alcohol use: Yes    Comment: rare  . Drug use: No      Review of Systems  Constitutional: Negative.   HENT: Negative.   Eyes: Negative.   Respiratory: Negative.   Cardiovascular: Negative.   Gastrointestinal: Positive for constipation. Negative for abdominal pain, anal bleeding and blood in stool.  Genitourinary: Negative.   Musculoskeletal: Positive for arthralgias (right knee pain).  Neurological: Negative for headaches.  Psychiatric/Behavioral: Negative for dysphoric mood.       Objective:   Physical Exam Vitals signs reviewed.  Constitutional:      General: She is not in acute distress.    Appearance: Normal appearance. She is obese. She is not ill-appearing, toxic-appearing or diaphoretic.  HENT:  Head: Normocephalic and atraumatic.  Eyes:     Conjunctiva/sclera: Conjunctivae normal.  Cardiovascular:     Rate and Rhythm: Normal rate and regular rhythm.     Heart sounds: Normal heart sounds.  Pulmonary:     Effort: Pulmonary effort is normal.     Breath sounds: Normal breath sounds.  Musculoskeletal:     Right lower leg: No edema.     Left lower leg: No edema.  Skin:    General: Skin is warm and dry.  Neurological:     Mental Status: She is alert and oriented to person, place, and time.  Psychiatric:        Mood and Affect: Mood normal.        Behavior: Behavior normal.        Thought Content: Thought content normal.        Judgment:  Judgment normal.       BP 110/64 (BP Location: Left Arm, Patient Position: Sitting, Cuff Size: Normal)   Pulse 62   Temp 98 F (36.7 C) (Temporal)   Ht 5' 2.5" (1.588 m)   Wt 170 lb 12.8 oz (77.5 kg)   LMP 06/22/1999 (Approximate)   SpO2 98%   BMI 30.74 kg/m  Depression screen PHQ 2/9 04/04/2019  Decreased Interest 0  Down, Depressed, Hopeless 0  PHQ - 2 Score 0       Assessment & Plan:  1. Encounter to establish care - Discussed and encouraged healthy lifestyle choices- adequate sleep, regular exercise, stress management and healthy food choices.  - Follow up 3-4 months for CPE with pap  2. Encounter for screening mammogram for malignant neoplasm of breast - MM Digital Screening; Future  3. Screening for colon cancer - Amb ref to Gastroenterology  4. Irritable bowel syndrome with constipation - Ambulatory referral to Gastroenterology  5. Slow transit constipation - Ambulatory referral to Gastroenterology - TSH - Comprehensive metabolic panel - CBC with Differential  6. Class 1 obesity due to excess calories without serious comorbidity with body mass index (BMI) of 30.0 to 30.9 in adult - TSH - Comprehensive metabolic panel - CBC with Differential - Vitamin D, 25-hydroxy - Lipid Panel  7. Need for influenza vaccination - Flu Vaccine QUAD 6+ mos PF IM (Fluarix Quad PF)  8. Need for Tdap vaccination - Tdap vaccine greater than or equal to 7yo IM  9. Chronic pain of right knee - not currently bothering her very much, she declined xray or further evaluation at this time   Clarene Reamer, FNP-BC  [facility], [place]  04/05/2019 9:28 AM

## 2019-04-04 NOTE — Progress Notes (Signed)
New Patient Office Visit  Subjective:  Patient ID: Destiny Harrell, female    DOB: 1961/06/04  Age: 58 y.o. MRN: [id number]  CC:  Chief Complaint  Patient presents with  . New Patient (Initial Visit)    establish care - Discuss IBS-C / Fall x 1 year injuring Right knee -hurting since fall.     HPI Destiny Harrell presents to establish care. Pt lives by herself. She has 2 sons, one son in [place], another one in [place], daughter lives locally. Pt works as Glass blower/designer.   Last CPE- 2018 Diet Exercise- enjoyed walking with her dog until the dog passed away. Planning to restart walking Tdap - unknown Colonoscopy -reffered Mammogram -2019  IBS- Pt has constipation related to the IBS. She takes laxatives 2-3 time a week and have maybe 2 BMs weekly.   Past Medical History:  Diagnosis Date  . Breast cancer ([place])   . Depression   . Hyperlipemia   . IBS (irritable bowel syndrome)   . Medical history non-contributory   . Migraines     Past Surgical History:  Procedure Laterality Date  . BREAST BIOPSY  08/16/2012  . BREAST LUMPECTOMY Left 08/31/2012  . BREAST LUMPECTOMY WITH NEEDLE LOCALIZATION AND AXILLARY SENTINEL LYMPH NODE BX Left 08/31/2012   Procedure: left BREAST needle localized  LUMPECTOMY WITH left sentinel lymph node mapping ;  Surgeon: Joyice Faster. Cornett, MD;  Location: [facility];  Service: General;  Laterality: Left;  needle localization at breast center of [place] 7:30   . BREAST SURGERY  08/31/12   ER+PR-HER-2neu-  . [place]  . CHOLECYSTECTOMY  2010  . OOPHORECTOMY  Right 1999, Left 2001    Family History  Problem Relation Age of Onset  . Cancer Mother        Breast Cancer  . Breast cancer Mother 94  . Arthritis Mother   . Hypertension Mother   . Cancer Father        Hodgkin Lymphoma  . Arthritis Sister   . Hyperlipidemia Sister   . Depression Brother     Social History   Socioeconomic History  . Marital  status: Single    Spouse name: Not on file  . Number of children: 3  . Years of education: Not on file  . Highest education level: Not on file  Occupational History    Employer: [employer]  Social Needs  . Financial resource strain: Not on file  . Food insecurity    Worry: Not on file    Inability: Not on file  . Transportation needs    Medical: Not on file    Non-medical: Not on file  Tobacco Use  . Smoking status: Current Every Day Smoker    Packs/day: 0.25    Years: 30.00    Pack years: 7.50    Types: Cigarettes    Start date: 04/03/1989  . Smokeless tobacco: Never Used  . Tobacco comment: 4 cigarettes per day   Substance and Sexual Activity  . Alcohol use: Yes    Comment: rare  . Drug use: No  . Sexual activity: Yes  Lifestyle  . Physical activity    Days per week: Not on file    Minutes per session: Not on file  . Stress: Not on file  Relationships  . Social Herbalist on phone: Not on file    Gets together: Not on file    Attends  religious service: Not on file    Active member of club or organization: Not on file    Attends meetings of clubs or organizations: Not on file    Relationship status: Not on file  . Intimate partner violence    Fear of current or ex partner: Not on file    Emotionally abused: Not on file    Physically abused: Not on file    Forced sexual activity: Not on file  Other Topics Concern  . Not on file  Social History Narrative   Single   2 sons 1 daughter by c-section   Menses age 12   First term pregnancy age 24   HRTx 20 years    ROS Review of Systems  Constitutional: Negative for activity change, fatigue and fever.  Respiratory: Negative for cough, chest tightness, shortness of breath and wheezing.   Cardiovascular: Negative for chest pain, palpitations and leg swelling.  Gastrointestinal: Positive for constipation. Negative for abdominal distention, abdominal pain, blood in stool, diarrhea, nausea and  vomiting.  Genitourinary: Negative for difficulty urinating.  Musculoskeletal: Positive for arthralgias.       Right knee  Skin: Negative.   Neurological: Negative for dizziness, weakness, light-headedness and headaches.    Objective:   Today's Vitals: BP 110/64 (BP Location: Left Arm, Patient Position: Sitting, Cuff Size: Normal)   Pulse 62   Temp 98 F (36.7 C) (Temporal)   Ht 5' 2.5" (1.588 m)   Wt 77.5 kg   LMP 06/22/1999 (Approximate)   SpO2 98%   BMI 30.74 kg/m   Physical Exam Constitutional:      Appearance: Normal appearance.  HENT:     Head: Normocephalic and atraumatic.  Neck:     Musculoskeletal: Normal range of motion.  Cardiovascular:     Rate and Rhythm: Normal rate and regular rhythm.     Pulses: Normal pulses.     Heart sounds: Normal heart sounds. No murmur. No friction rub. No gallop.   Pulmonary:     Effort: Pulmonary effort is normal. No respiratory distress.     Breath sounds: Normal breath sounds. No wheezing or rales.  Abdominal:     General: Bowel sounds are normal.     Palpations: Abdomen is soft.     Tenderness: There is no abdominal tenderness.  Neurological:     Mental Status: She is alert and oriented to person, place, and time. Mental status is at baseline.  Psychiatric:        Mood and Affect: Mood normal.        Behavior: Behavior normal.        Thought Content: Thought content normal.     Assessment & Plan:  1. Encounter for screening mammogram for malignant neoplasm of breast - MM Digital Screening; Future  2. Screening for colon cancer  3. Irritable bowel syndrome with constipation - Ambulatory referral to Gastroenterology  4. Slow transit constipation - Ambulatory referral to Gastroenterology - TSH - Comprehensive metabolic panel - CBC with Differential  5. Class 1 obesity due to excess calories without serious comorbidity with body mass index (BMI) of 30.0 to 30.9 in adult - TSH - Comprehensive metabolic panel -  CBC with Differential - Vitamin D, 25-hydroxy - Lipid Panel  6. Need for influenza vaccination Recommended annual vaccination - Flu Vaccine QUAD 6+ mos PF IM (Fluarix Quad PF)  7. Need for Tdap vaccination Recommended vaccination - Tdap vaccine greater than or equal to 7yo IM  Problem List Items Addressed  This Visit    None      Outpatient Encounter Medications as of 04/04/2019  Medication Sig  . Multiple Vitamins-Minerals (MULTIVITAMIN WITH MINERALS) tablet Take 1 tablet by mouth daily.  Marland Kitchen atorvastatin (LIPITOR) 20 MG tablet Take 20 mg by mouth daily.  . [DISCONTINUED] anastrozole (ARIMIDEX) 1 MG tablet Take 1 tablet (1 mg total) by mouth daily. (Patient not taking: Reported on 04/04/2019)  . [DISCONTINUED] atorvastatin (LIPITOR) 10 MG tablet Take 10 mg by mouth daily.  . [DISCONTINUED] ibuprofen (ADVIL,MOTRIN) 200 MG tablet Take 200 mg by mouth every 6 (six) hours as needed for pain.  . [DISCONTINUED] meloxicam (MOBIC) 15 MG tablet Take 15 mg by mouth as needed.   . [DISCONTINUED] Vitamin D, Ergocalciferol, (DRISDOL) 50000 UNITS CAPS capsule Take 50,000 Units by mouth once a week.   No facility-administered encounter medications on file as of 04/04/2019.     Follow-up: No follow-ups on file.   Rica Koyanagi, RN

## 2019-04-05 ENCOUNTER — Encounter: Payer: Self-pay | Admitting: Family Medicine

## 2019-04-05 DIAGNOSIS — K581 Irritable bowel syndrome with constipation: Secondary | ICD-10-CM | POA: Insufficient documentation

## 2019-04-05 DIAGNOSIS — Z6830 Body mass index (BMI) 30.0-30.9, adult: Secondary | ICD-10-CM | POA: Insufficient documentation

## 2019-04-05 DIAGNOSIS — G8929 Other chronic pain: Secondary | ICD-10-CM | POA: Insufficient documentation

## 2019-04-05 DIAGNOSIS — E6609 Other obesity due to excess calories: Secondary | ICD-10-CM | POA: Insufficient documentation

## 2019-04-06 MED ORDER — ATORVASTATIN CALCIUM 20 MG PO TABS: 20.0000 mg | ORAL_TABLET | Freq: Every day | ORAL | 3 refills | Status: DC

## 2019-04-06 NOTE — Addendum Note (Signed)
Addended by: Clarene Reamer B on: 04/06/2019 11:59 AM   Modules accepted: Orders

## 2019-04-11 ENCOUNTER — Encounter: Payer: Self-pay | Admitting: Nurse Practitioner

## 2019-04-11 ENCOUNTER — Ambulatory Visit: Payer: No Typology Code available for payment source | Admitting: Nurse Practitioner

## 2019-04-11 VITALS — BP 110/70 | HR 79 | Temp 98.4°F | Ht 62.5 in | Wt 168.0 lb

## 2019-04-11 DIAGNOSIS — K219 Gastro-esophageal reflux disease without esophagitis: Secondary | ICD-10-CM | POA: Diagnosis not present

## 2019-04-11 DIAGNOSIS — Z1211 Encounter for screening for malignant neoplasm of colon: Secondary | ICD-10-CM

## 2019-04-11 DIAGNOSIS — Z1159 Encounter for screening for other viral diseases: Secondary | ICD-10-CM | POA: Diagnosis not present

## 2019-04-11 DIAGNOSIS — K5909 Other constipation: Secondary | ICD-10-CM | POA: Diagnosis not present

## 2019-04-11 MED ORDER — NA SULFATE-K SULFATE-MG SULF 17.5-3.13-1.6 GM/177ML PO SOLN: ORAL | 0 refills | Status: DC

## 2019-04-11 NOTE — Progress Notes (Signed)
ASSESSMENT / PLAN:   1. Longstanding history of IBS-C. Laxative dependent. She requires a laxative about 3 times a week.  -She will think about trying something like Amitiza, Motegrity or Trulance but not ready to make the switch from laxatives right now -Continue with good hydration.  2. Colon cancer screening.  Reportedly normal colonoscopy in [place] in 2009.  -The risks and benefits of colonoscopy with possible polypectomy / biopsies were discussed and the patient agrees to proceed.   3. GERD.  Several episodes of heartburn over the last 3 months. She has had recent weight gain and has been consuming increased amounts of caffeine.    -She will start by decreasing caffeine consumption -Even mild weight loss can help GERD symptoms.  -If no improvement after reduction of caffeine intake then will can talk about acid blocker therapy   HPI:    Referring Provider:   Clarene Reamer, MD Reason for referral:   IBS   Chief Complaint:   IBS, constipation  Destiny Harrell is a 58 yo female with pmh significant for breast cancer, depression, migraine headaches, hyperlipidemia, cholecystectomy and IBS. She was diagnosed with IBS many years ago while living in [place].  She has chronic constipation defined as decreased frequency of bowel movements.  When she does have a bowel movement the stool may initially be firm but it that is generally followed by  softer stools.  Over the years she has taken several agents such as daily MiraLAX, daily Citrucel, bowel cleanses, and she tried Linzess but only for 4 days.  Taking a laxative three times a week is the only thing that has kept her regular. She has no rectal bleeding. She reports having had a normal colonoscopy in 2009, told she needed repeat exam in 10 years.    In addition to above, [place] started having GERD symptoms a few months ago.  She has had 7-8 episodes of severe heartburn.  Episodes are sporadic, not necessarily  related to eating.  For relief she chews mint gum, increases water intake and then just waits for the burning to resolve. She does admit to drinking increased amounts of caffeine lately and she has gained a few pounds.     Data Reviewed:   04/04/19 CMET normal CBC normal   Past Medical History:  Diagnosis Date  . Breast cancer ([place])   . Depression   . Hyperlipemia   . IBS (irritable bowel syndrome)   . Migraines      Past Surgical History:  Procedure Laterality Date  . BREAST BIOPSY  08/16/2012  . BREAST LUMPECTOMY Left 08/31/2012  . BREAST LUMPECTOMY WITH NEEDLE LOCALIZATION AND AXILLARY SENTINEL LYMPH NODE BX Left 08/31/2012   Procedure: left BREAST needle localized  LUMPECTOMY WITH left sentinel lymph node mapping ;  Surgeon: Joyice Faster. Cornett, MD;  Location: [facility];  Service: General;  Laterality: Left;  needle localization at breast center of [place] 7:30   . BREAST SURGERY  08/31/12   ER+PR-HER-2neu-  . [place]  . CHOLECYSTECTOMY  2010  . OOPHORECTOMY  Right 1999, Left 2001   Family History  Problem Relation Age of Onset  . Breast cancer Mother 20  . Arthritis Mother   . Hypertension Mother   . Cancer Father        Hodgkin Lymphoma  . Other Father        "  twisted colon"  . Arthritis Sister   . Hyperlipidemia Sister   . Depression Brother   . Colon cancer Neg Hx    Social History   Tobacco Use  . Smoking status: Current Every Day Smoker    Packs/day: 0.25    Years: 30.00    Pack years: 7.50    Types: Cigarettes    Start date: 04/03/1989  . Smokeless tobacco: Never Used  . Tobacco comment: 4 cigarettes per day   Substance Use Topics  . Alcohol use: Yes    Comment: rare  . Drug use: No   Current Outpatient Medications  Medication Sig Dispense Refill  . atorvastatin (LIPITOR) 20 MG tablet Take 1 tablet (20 mg total) by mouth daily. 90 tablet 3  . Multiple Vitamins-Minerals (MULTIVITAMIN WITH MINERALS) tablet  Take 1 tablet by mouth daily.     No current facility-administered medications for this visit.    No Known Allergies   Review of Systems: All systems reviewed and negative except where noted in HPI.   Serum creatinine: 0.7 mg/dL 04/04/19 1034 Estimated creatinine clearance: 75.1 mL/min   Physical Exam:    Wt Readings from Last 3 Encounters:  04/11/19 168 lb (76.2 kg)  04/04/19 170 lb 12.8 oz (77.5 kg)  08/19/14 168 lb 6.4 oz (76.4 kg)    BP 110/70   Pulse 79   Temp 98.4 F (36.9 C)   Ht 5' 2.5" (1.588 m)   Wt 168 lb (76.2 kg)   LMP 06/22/1999 (Approximate)   BMI 30.24 kg/m  Constitutional:  Pleasant female in no acute distress. Psychiatric: Normal mood and affect. Behavior is normal. EENT: Pupils normal.  Conjunctivae are normal. No scleral icterus. Neck supple.  Cardiovascular: Normal rate, regular rhythm. No edema Pulmonary/chest: Effort normal and breath sounds normal. No wheezing, rales or rhonchi. Abdominal: Soft, nondistended, nontender. Bowel sounds active throughout. There are no masses palpable. No hepatomegaly. Neurological: Alert and oriented to person place and time. Skin: Skin is warm and dry. No rashes noted.  Tye Savoy, NP  04/11/2019, 10:50 AM  Cc: Elby Beck, FNP

## 2019-04-11 NOTE — Patient Instructions (Signed)
If you are age 57 or older, your body mass index should be between 23-30. Your Body mass index is 30.24 kg/m. If this is out of the aforementioned range listed, please consider follow up with your Primary Care Provider.  If you are age 19 or younger, your body mass index should be between 19-25. Your Body mass index is 30.24 kg/m. If this is out of the aformentioned range listed, please consider follow up with your Primary Care Provider.   You have been scheduled for a colonoscopy. Please follow written instructions given to you at your visit today.  Please pick up your prep supplies at the pharmacy within the next 1-3 days. If you use inhalers (even only as needed), please bring them with you on the day of your procedure. Your physician has requested that you go to www.startemmi.com and enter the access code given to you at your visit today. This web site gives a general overview about your procedure. However, you should still follow specific instructions given to you by our office regarding your preparation for the procedure.  We have sent the following medications to your pharmacy for you to pick up at your convenience: Suprep  Due to recent COVID-19 restrictions implemented by [employer] and state authorities and in an effort to keep both patients and staff as safe as possible, [facility] requires COVID-19 testing prior to any scheduled endoscopic procedure. The testing center is located at [address] [place], [place] [address] in the [facility] [employer]  suite.  Your appointment has been scheduled for 04/20/19  At 8:10 am.   Please bring your insurance cards to this appointment. You will require your COVID screen 2 business days prior to your endoscopic procedure.  You are not required to quarantine after your screening.  You will only receive a phone call with the results if it is POSITIVE.  If you do not receive a call the day  before your procedure you should begin your prep, if ordered, and you should report to the endo center for your procedure at your designated appointment arrival time ( one hour prior to the procedure time). There is no cost to you for the screening on the day of the swab.  [facility] will file with your insurance company for the testing.    You may receive an automated phone call prior to your procedure or have a message in your MyChart that you have an appointment for a BP/15 at the [facility], please disregard this message.  Your testing will be at the [place] [employer] location.   If you are leaving [facility] travel [place] on [address], turn left onto [address], turn night onto [address] at the 1st stop light turn right, pass the [employer] on your right and proceed to [address] (white building).   Thank you for choosing me and [facility].   Tye Savoy, NP

## 2019-04-12 ENCOUNTER — Encounter: Payer: Self-pay | Admitting: Nurse Practitioner

## 2019-04-12 NOTE — Progress Notes (Signed)
Agree with assessment and plan as outlined.  

## 2019-04-20 ENCOUNTER — Other Ambulatory Visit: Payer: Self-pay | Admitting: Gastroenterology

## 2019-04-20 ENCOUNTER — Encounter: Payer: Self-pay | Admitting: Gastroenterology

## 2019-04-20 LAB — SARS CORONAVIRUS 2 (TAT 6-24 HRS): SARS Coronavirus 2: NEGATIVE

## 2019-04-23 ENCOUNTER — Encounter: Payer: No Typology Code available for payment source | Admitting: Gastroenterology

## 2019-04-24 ENCOUNTER — Other Ambulatory Visit: Payer: Self-pay

## 2019-04-24 ENCOUNTER — Ambulatory Visit (AMBULATORY_SURGERY_CENTER): Payer: No Typology Code available for payment source | Admitting: Gastroenterology

## 2019-04-24 ENCOUNTER — Encounter: Payer: Self-pay | Admitting: Gastroenterology

## 2019-04-24 VITALS — BP 141/88 | HR 73 | Temp 98.3°F | Resp 16 | Ht 62.0 in | Wt 168.0 lb

## 2019-04-24 DIAGNOSIS — D123 Benign neoplasm of transverse colon: Secondary | ICD-10-CM | POA: Diagnosis not present

## 2019-04-24 DIAGNOSIS — D129 Benign neoplasm of anus and anal canal: Secondary | ICD-10-CM

## 2019-04-24 DIAGNOSIS — D125 Benign neoplasm of sigmoid colon: Secondary | ICD-10-CM

## 2019-04-24 DIAGNOSIS — Z1211 Encounter for screening for malignant neoplasm of colon: Secondary | ICD-10-CM

## 2019-04-24 HISTORY — PX: COLONOSCOPY: SHX174

## 2019-04-24 MED ORDER — SODIUM CHLORIDE 0.9 % IV SOLN: 500.0000 mL | Freq: Once | INTRAVENOUS | Status: DC

## 2019-04-24 NOTE — Op Note (Signed)
[facility] Patient Name: Destiny Harrell Procedure Date: 04/24/2019 2:16 PM MRN: [id number] Endoscopist: Remo Lipps P. Havery Moros , MD Age: 58 Referring MD:  Date of Birth: January 24, 1961 Gender: Female Account #: [id number] Procedure:                Colonoscopy Indications:              Screening for colorectal malignant neoplasm Medicines:                Monitored Anesthesia Care Procedure:                Pre-Anesthesia Assessment:                           - Prior to the procedure, a History and Physical                            was performed, and patient medications and                            allergies were reviewed. The patient's tolerance of                            previous anesthesia was also reviewed. The risks                            and benefits of the procedure and the sedation                            options and risks were discussed with the patient.                            All questions were answered, and informed consent                            was obtained. Prior Anticoagulants: The patient has                            taken no previous anticoagulant or antiplatelet                            agents. ASA Grade Assessment: II - A patient with                            mild systemic disease. After reviewing the risks                            and benefits, the patient was deemed in                            satisfactory condition to undergo the procedure.                           After obtaining informed consent, the colonoscope  was passed under direct vision. Throughout the                            procedure, the patient's blood pressure, pulse, and                            oxygen saturations were monitored continuously. The                            Colonoscope was introduced through the anus and                            advanced to the the cecum, identified by                            appendiceal orifice and  ileocecal valve. The                            patient tolerated the procedure well. The                            colonoscopy was technically difficult and complex                            due to significant looping. Scope In: 2:30:32 PM Scope Out: 3:10:58 PM Scope Withdrawal Time: 0 hours 34 minutes 41 seconds  Total Procedure Duration: 0 hours 40 minutes 26 seconds  Findings:                 The perianal and digital rectal examinations were                            normal.                           Two sessile polyps were found in the transverse                            colon. The polyps were 3 to 4 mm in size. These                            polyps were removed with a cold snare. Resection                            and retrieval were complete.                           A diminutive polyp was found in the sigmoid colon.                            The polyp was sessile. The polyp was removed with a                            cold snare. Resection and retrieval were complete.  Multiple small-mouthed diverticula were found in                            the sigmoid colon.                           Anal papillae was hypertrophied. Biopsies were                            taken with a cold forceps for histology to rule out                            AIN.                           The colon was extremely tortuous which prolonged                            the exam.                           Internal hemorrhoids were found during retroflexion.                           The exam was otherwise without abnormality. Complications:            No immediate complications. Estimated blood loss:                            Minimal. Estimated Blood Loss:     Estimated blood loss was minimal. Impression:               - Two 3 to 4 mm polyps in the transverse colon,                            removed with a cold snare. Resected and retrieved.                            - One diminutive polyp in the sigmoid colon,                            removed with a cold snare. Resected and retrieved.                           - Diverticulosis in the sigmoid colon.                           - Anal papillae was hypertrophied. Biopsied to rule                            out AIN.                           - Internal hemorrhoids.                           - Tortous  colon                           - The examination was otherwise normal. Recommendation:           - Patient has a contact number available for                            emergencies. The signs and symptoms of potential                            delayed complications were discussed with the                            patient. Return to normal activities tomorrow.                            Written discharge instructions were provided to the                            patient.                           - Resume previous diet.                           - Continue present medications.                           - Await pathology results. Remo Lipps P. Samaria Anes, MD 04/24/2019 3:15:42 PM This report has been signed electronically.

## 2019-04-24 NOTE — Progress Notes (Signed)
Called to room to assist during endoscopic procedure.  Patient ID and intended procedure confirmed with present staff. Received instructions for my participation in the procedure from the performing physician.  

## 2019-04-24 NOTE — Patient Instructions (Signed)
Please read handouts provided. Await pathology results. Continue present medications.      YOU HAD AN ENDOSCOPIC PROCEDURE TODAY AT THE [facility]:   Refer to the procedure report that was given to you for any specific questions about what was found during the examination.  If the procedure report does not answer your questions, please call your gastroenterologist to clarify.  If you requested that your care partner not be given the details of your procedure findings, then the procedure report has been included in a sealed envelope for you to review at your convenience later.  YOU SHOULD EXPECT: Some feelings of bloating in the abdomen. Passage of more gas than usual.  Walking can help get rid of the air that was put into your GI tract during the procedure and reduce the bloating. If you had a lower endoscopy (such as a colonoscopy or flexible sigmoidoscopy) you may notice spotting of blood in your stool or on the toilet paper. If you underwent a bowel prep for your procedure, you may not have a normal bowel movement for a few days.  Please Note:  You might notice some irritation and congestion in your nose or some drainage.  This is from the oxygen used during your procedure.  There is no need for concern and it should clear up in a day or so.  SYMPTOMS TO REPORT IMMEDIATELY:   Following lower endoscopy (colonoscopy or flexible sigmoidoscopy):  Excessive amounts of blood in the stool  Significant tenderness or worsening of abdominal pains  Swelling of the abdomen that is new, acute  Fever of 100F or higher    For urgent or emergent issues, a gastroenterologist can be reached at any hour by calling [phone].   DIET:  We do recommend a small meal at first, but then you may proceed to your regular diet.  Drink plenty of fluids but you should avoid alcoholic beverages for 24 hours.  ACTIVITY:  You should plan to take it easy for the rest of today and you should NOT  DRIVE or use heavy machinery until tomorrow (because of the sedation medicines used during the test).    FOLLOW UP: Our staff will call the number listed on your records 48-72 hours following your procedure to check on you and address any questions or concerns that you may have regarding the information given to you following your procedure. If we do not reach you, we will leave a message.  We will attempt to reach you two times.  During this call, we will ask if you have developed any symptoms of COVID 19. If you develop any symptoms (ie: fever, flu-like symptoms, shortness of breath, cough etc.) before then, please call [phone].  If you test positive for Covid 19 in the 2 weeks post procedure, please call and report this information to us.    If any biopsies were taken you will be contacted by phone or by letter within the next 1-3 weeks.  Please call us at [phone] if you have not heard about the biopsies in 3 weeks.    SIGNATURES/CONFIDENTIALITY: You and/or your care partner have signed paperwork which will be entered into your electronic medical record.  These signatures attest to the fact that that the information above on your After Visit Summary has been reviewed and is understood.  Full responsibility of the confidentiality of this discharge information lies with you and/or your care-partner. 

## 2019-04-26 ENCOUNTER — Telehealth: Payer: Self-pay | Admitting: *Deleted

## 2019-04-26 ENCOUNTER — Telehealth: Payer: Self-pay

## 2019-04-26 NOTE — Telephone Encounter (Signed)
  Follow up Call-  Call back number 04/24/2019  Post procedure Call Back phone  # [phone]  Permission to leave phone message Yes  Some recent data might be hidden     Patient questions:  Do you have a fever, pain , or abdominal swelling? No. Pain Score  0 *  Have you tolerated food without any problems? Yes.    Have you been able to return to your normal activities? Yes.    Do you have any questions about your discharge instructions: Diet   No. Medications  No. Follow up visit  No.  Do you have questions or concerns about your Care? No.  Actions: * If pain score is 4 or above: No action needed, pain <4.  1. Have you developed a fever since your procedure? no  2.   Have you had an respiratory symptoms (SOB or cough) since your procedure? no  3.   Have you tested positive for COVID 19 since your procedure no  4.   Have you had any family members/close contacts diagnosed with the COVID 19 since your procedure?  no   If yes to any of these questions please route to Joylene John, RN and Alphonsa Gin, Therapist, sports.

## 2019-04-26 NOTE — Telephone Encounter (Signed)
NO ANSWER, MESSAGE LEFT FOR PATIENT. 

## 2019-05-03 ENCOUNTER — Encounter: Payer: Self-pay | Admitting: Gastroenterology

## 2019-06-20 ENCOUNTER — Other Ambulatory Visit: Payer: Self-pay | Admitting: Family Medicine

## 2019-06-20 DIAGNOSIS — E7841 Elevated Lipoprotein(a): Secondary | ICD-10-CM

## 2019-06-29 ENCOUNTER — Other Ambulatory Visit: Payer: No Typology Code available for payment source

## 2019-07-03 ENCOUNTER — Other Ambulatory Visit: Payer: No Typology Code available for payment source

## 2019-07-06 ENCOUNTER — Other Ambulatory Visit (HOSPITAL_COMMUNITY)
Admission: RE | Admit: 2019-07-06 | Discharge: 2019-07-06 | Disposition: A | Discharge status: Home / Self Care | Payer: PRIVATE HEALTH INSURANCE | Source: Ambulatory Visit | Attending: Family Medicine | Admitting: Family Medicine

## 2019-07-06 ENCOUNTER — Other Ambulatory Visit: Payer: Self-pay

## 2019-07-06 ENCOUNTER — Encounter: Payer: Self-pay | Admitting: Family Medicine

## 2019-07-06 ENCOUNTER — Ambulatory Visit (INDEPENDENT_AMBULATORY_CARE_PROVIDER_SITE_OTHER): Payer: No Typology Code available for payment source | Admitting: Family Medicine

## 2019-07-06 VITALS — BP 122/60 | HR 72 | Temp 97.2°F | Ht 62.5 in | Wt 169.0 lb

## 2019-07-06 DIAGNOSIS — G8929 Other chronic pain: Secondary | ICD-10-CM

## 2019-07-06 DIAGNOSIS — Z0000 Encounter for general adult medical examination without abnormal findings: Secondary | ICD-10-CM

## 2019-07-06 DIAGNOSIS — M25561 Pain in right knee: Secondary | ICD-10-CM | POA: Diagnosis not present

## 2019-07-06 DIAGNOSIS — K581 Irritable bowel syndrome with constipation: Secondary | ICD-10-CM

## 2019-07-06 DIAGNOSIS — Z124 Encounter for screening for malignant neoplasm of cervix: Secondary | ICD-10-CM | POA: Diagnosis not present

## 2019-07-06 DIAGNOSIS — R0789 Other chest pain: Secondary | ICD-10-CM

## 2019-07-06 NOTE — Progress Notes (Signed)
Subjective:    Patient ID: Destiny Harrell, female    DOB: 1960/12/13, 59 y.o.   MRN: [id number]  HPI Chief Complaint  Patient presents with  . Annual Exam   This is a 59 yo female who presents today for CPE.   Last CPE- 2018 Mammo- scheduled for next week 07/10/19 Pap-over 5 years Colonoscopy- 04/24/2019 Tdap- 04/04/2019 Flu- 04/04/2019 Eye-regular Dental-regular Exercise-she was walking until last week when she had a very busy week.  She does recognize that that made her feel better and she intends to resume.  Chest discomfort.  She has noticed this off and on for approximately 1 year.  Feels "like a pill is stuck."  She cannot identify any particular triggers.  It does not occur with exertion.  She did not does not have any associated diaphoresis, jaw pain, arm pain, shortness of breath.  She does not have any acid indigestion.  It is always in the same place midsternum.  It does not disrupt her sleep.  She has not taken anything for it.  Past Medical History:  Diagnosis Date  . Breast cancer ([place])   . Depression   . Hyperlipemia   . IBS (irritable bowel syndrome)   . Migraines    Past Surgical History:  Procedure Laterality Date  . BREAST BIOPSY  08/16/2012  . BREAST LUMPECTOMY Left 08/31/2012  . BREAST LUMPECTOMY WITH NEEDLE LOCALIZATION AND AXILLARY SENTINEL LYMPH NODE BX Left 08/31/2012   Procedure: left BREAST needle localized  LUMPECTOMY WITH left sentinel lymph node mapping ;  Surgeon: Joyice Faster. Cornett, MD;  Location: [facility];  Service: General;  Laterality: Left;  needle localization at breast center of [place] 7:30   . BREAST SURGERY  08/31/12   ER+PR-HER-[id number]-  . [place]  . CHOLECYSTECTOMY  2010  . COLONOSCOPY  04/24/2019  . OOPHORECTOMY  Right 1999, Left 2001  . POLYPECTOMY     Family History  Problem Relation Age of Onset  . Breast cancer Mother 59  . Arthritis Mother   . Hypertension Mother   . Cancer Father          Hodgkin Lymphoma  . Other Father        "twisted colon"  . Arthritis Sister   . Hyperlipidemia Sister   . Depression Brother   . Colon cancer Neg Hx   . Esophageal cancer Neg Hx   . Stomach cancer Neg Hx   . Rectal cancer Neg Hx    Social History   Tobacco Use  . Smoking status: Current Every Day Smoker    Packs/day: 0.25    Years: 30.00    Pack years: 7.50    Types: Cigarettes    Start date: 04/03/1989  . Smokeless tobacco: Never Used  . Tobacco comment: 4 cigarettes per day   Substance Use Topics  . Alcohol use: Yes    Comment: rare  . Drug use: No        Review of Systems  Constitutional: Negative.   HENT: Negative.   Eyes: Negative.   Respiratory: Negative.   Cardiovascular: Positive for chest pain (see HPI). Negative for palpitations and leg swelling.  Gastrointestinal: Positive for constipation (seen in fall by GI, some improvement since colonoscopy, has noticed if she takes laxative, abdominal discomfort). Negative for blood in stool.  Endocrine: Negative.   Genitourinary: Negative.   Musculoskeletal: Positive for joint swelling (chronic right knee pain. Recent fall on wet floor at work. ).  Skin: Negative.   Allergic/Immunologic: Negative.   Neurological: Negative.   Hematological: Negative.   Psychiatric/Behavioral: Negative.        Objective:   Physical Exam Vitals reviewed. Exam conducted with a chaperone present Brayton El, CMA).  Constitutional:      General: She is not in acute distress.    Appearance: Normal appearance. She is obese. She is not ill-appearing, toxic-appearing or diaphoretic.  HENT:     Head: Normocephalic and atraumatic.     Right Ear: External ear normal.     Left Ear: External ear normal.  Eyes:     Conjunctiva/sclera: Conjunctivae normal.  Cardiovascular:     Rate and Rhythm: Normal rate and regular rhythm.     Heart sounds: Normal heart sounds.  Pulmonary:     Effort: Pulmonary effort is normal.      Breath sounds: Normal breath sounds.  Abdominal:     General: There is no distension.     Palpations: Abdomen is soft. There is no mass.     Tenderness: There is no abdominal tenderness. There is no guarding or rebound.     Hernia: No hernia is present.  Genitourinary:    Exam position: Supine.     Labia:        Right: No rash, tenderness, lesion or injury.        Left: No rash, tenderness, lesion or injury.      Urethra: No prolapse, urethral pain, urethral swelling or urethral lesion.     Vagina: Normal.     Cervix: Cervical bleeding (after brush sample obtained) present.  Musculoskeletal:     Right lower leg: No edema.     Left lower leg: No edema.  Skin:    General: Skin is warm and dry.  Neurological:     Mental Status: She is alert and oriented to person, place, and time.  Psychiatric:        Mood and Affect: Mood normal.        Behavior: Behavior normal.        Thought Content: Thought content normal.        Judgment: Judgment normal.      BP 122/60   Pulse 72   Temp (!) 97.2 F (36.2 C)   Ht 5' 2.5" (1.588 m)   Wt 169 lb (76.7 kg)   LMP 06/22/1999 (Approximate)   SpO2 98%   BMI 30.42 kg/m  Wt Readings from Last 3 Encounters:  07/06/19 169 lb (76.7 kg)  04/24/19 168 lb (76.2 kg)  04/11/19 168 lb (76.2 kg)   Depression screen [facility] 2/9 07/06/2019 04/04/2019  Decreased Interest 3 0  Down, Depressed, Hopeless 0 0  PHQ - 2 Score 3 0         Assessment & Plan:  1. Annual physical exam - Discussed and encouraged healthy lifestyle choices- adequate sleep, regular exercise, stress management and healthy food choices.  - encouraged smoking cessation  2. Screening for cervical cancer - Cytology - PAP([place])  3. Irritable bowel syndrome with constipation - more abdominal discomfort since colonoscopy, encouraged her to take one month of probiotic  4. Chronic pain of right knee - ice/ heat, otc analgesics, bracing prn  5. Other chest pain - does not  sound cardiac in nature, I have asked her to be mindful of episodes and try to identify triggers - I suggested she use an otc liquid such as Mylanta, Mylicon twice a day for several days and report back if  no improvement.   - follow up in 1year   Clarene Reamer, FNP-BC  [facility]  07/06/2019 2:16 PM

## 2019-07-06 NOTE — Patient Instructions (Signed)
Good to see you today  Please try 3-5 days of twice a day Mylicon or Mylanta type over the counter liquid medication, if no improvement, please let me know  Follow up in 1 year for annual exam  Consider smoking cessation  Get back to walking regularly, add some yoga/ stretching   Health Maintenance for Postmenopausal Women Menopause is a normal process in which your ability to get pregnant comes to an end. This process happens slowly over many months or years, usually between the ages of 16 and 76. Menopause is complete when you have missed your menstrual periods for 12 months. It is important to talk with your health care provider about some of the most common conditions that affect women after menopause (postmenopausal women). These include heart disease, cancer, and bone loss (osteoporosis). Adopting a healthy lifestyle and getting preventive care can help to promote your health and wellness. The actions you take can also lower your chances of developing some of these common conditions. What should I know about menopause? During menopause, you may get a number of symptoms, such as:  Hot flashes. These can be moderate or severe.  Night sweats.  Decrease in sex drive.  Mood swings.  Headaches.  Tiredness.  Irritability.  Memory problems.  Insomnia. Choosing to treat or not to treat these symptoms is a decision that you make with your health care provider. Do I need hormone replacement therapy?  Hormone replacement therapy is effective in treating symptoms that are caused by menopause, such as hot flashes and night sweats.  Hormone replacement carries certain risks, especially as you become older. If you are thinking about using estrogen or estrogen with progestin, discuss the benefits and risks with your health care provider. What is my risk for heart disease and stroke? The risk of heart disease, heart attack, and stroke increases as you age. One of the causes may be a  change in the body's hormones during menopause. This can affect how your body uses dietary fats, triglycerides, and cholesterol. Heart attack and stroke are medical emergencies. There are many things that you can do to help prevent heart disease and stroke. Watch your blood pressure  High blood pressure causes heart disease and increases the risk of stroke. This is more likely to develop in people who have high blood pressure readings, are of African descent, or are overweight.  Have your blood pressure checked: ? Every 3-5 years if you are 73-9 years of age. ? Every year if you are 24 years old or older. Eat a healthy diet   Eat a diet that includes plenty of vegetables, fruits, low-fat dairy products, and lean protein.  Do not eat a lot of foods that are high in solid fats, added sugars, or sodium. Get regular exercise Get regular exercise. This is one of the most important things you can do for your health. Most adults should:  Try to exercise for at least 150 minutes each week. The exercise should increase your heart rate and make you sweat (moderate-intensity exercise).  Try to do strengthening exercises at least twice each week. Do these in addition to the moderate-intensity exercise.  Spend less time sitting. Even light physical activity can be beneficial. Other tips  Work with your health care provider to achieve or maintain a healthy weight.  Do not use any products that contain nicotine or tobacco, such as cigarettes, e-cigarettes, and chewing tobacco. If you need help quitting, ask your health care provider.  Know your numbers.  Ask your health care provider to check your cholesterol and your blood sugar (glucose). Continue to have your blood tested as directed by your health care provider. Do I need screening for cancer? Depending on your health history and family history, you may need to have cancer screening at different stages of your life. This may include screening  for:  Breast cancer.  Cervical cancer.  Lung cancer.  Colorectal cancer. What is my risk for osteoporosis? After menopause, you may be at increased risk for osteoporosis. Osteoporosis is a condition in which bone destruction happens more quickly than new bone creation. To help prevent osteoporosis or the bone fractures that can happen because of osteoporosis, you may take the following actions:  If you are 62-38 years old, get at least 1,000 mg of calcium and at least 600 mg of vitamin D per day.  If you are older than age 68 but younger than age 84, get at least 1,200 mg of calcium and at least 600 mg of vitamin D per day.  If you are older than age 61, get at least 1,200 mg of calcium and at least 800 mg of vitamin D per day. Smoking and drinking excessive alcohol increase the risk of osteoporosis. Eat foods that are rich in calcium and vitamin D, and do weight-bearing exercises several times each week as directed by your health care provider. How does menopause affect my mental health? Depression may occur at any age, but it is more common as you become older. Common symptoms of depression include:  Low or sad mood.  Changes in sleep patterns.  Changes in appetite or eating patterns.  Feeling an overall lack of motivation or enjoyment of activities that you previously enjoyed.  Frequent crying spells. Talk with your health care provider if you think that you are experiencing depression. General instructions See your health care provider for regular wellness exams and vaccines. This may include:  Scheduling regular health, dental, and eye exams.  Getting and maintaining your vaccines. These include: ? Influenza vaccine. Get this vaccine each year before the flu season begins. ? Pneumonia vaccine. ? Shingles vaccine. ? Tetanus, diphtheria, and pertussis (Tdap) booster vaccine. Your health care provider may also recommend other immunizations. Tell your health care provider  if you have ever been abused or do not feel safe at home. Summary  Menopause is a normal process in which your ability to get pregnant comes to an end.  This condition causes hot flashes, night sweats, decreased interest in sex, mood swings, headaches, or lack of sleep.  Treatment for this condition may include hormone replacement therapy.  Take actions to keep yourself healthy, including exercising regularly, eating a healthy diet, watching your weight, and checking your blood pressure and blood sugar levels.  Get screened for cancer and depression. Make sure that you are up to date with all your vaccines. This information is not intended to replace advice given to you by your health care provider. Make sure you discuss any questions you have with your health care provider. Document Revised: 05/31/2018 Document Reviewed: 05/31/2018 Elsevier Patient Education  2020 [employer].

## 2019-07-09 LAB — CYTOLOGY - PAP
Comment: NEGATIVE
Diagnosis: NEGATIVE
High risk HPV: NEGATIVE

## 2019-07-10 ENCOUNTER — Ambulatory Visit
Admission: RE | Admit: 2019-07-10 | Discharge: 2019-07-10 | Disposition: A | Discharge status: Home / Self Care | Payer: No Typology Code available for payment source | Source: Ambulatory Visit | Attending: Family Medicine | Admitting: Family Medicine

## 2019-07-10 DIAGNOSIS — Z1231 Encounter for screening mammogram for malignant neoplasm of breast: Secondary | ICD-10-CM | POA: Insufficient documentation

## 2019-07-10 HISTORY — DX: Personal history of irradiation: Z92.3

## 2019-08-31 ENCOUNTER — Ambulatory Visit: Payer: No Typology Code available for payment source | Attending: Internal Medicine

## 2019-08-31 DIAGNOSIS — Z23 Encounter for immunization: Secondary | ICD-10-CM

## 2019-08-31 NOTE — Progress Notes (Signed)
   Covid-19 Vaccination Clinic  Name:  Destiny Harrell    MRN: [id number] DOB: 02-14-1961  08/31/2019  [name] was observed post Covid-19 immunization for 15 minutes without incident. She was provided with Vaccine Information Sheet and instruction to access the V-Safe system.   [name] was instructed to call 911 with any severe reactions post vaccine: Marland Kitchen Difficulty breathing  . Swelling of face and throat  . A fast heartbeat  . A bad rash all over body  . Dizziness and weakness   Immunizations Administered    Name Date Dose VIS Date Route   Pfizer COVID-19 Vaccine 08/31/2019  8:34 AM 0.3 mL 06/01/2019 Intramuscular   Manufacturer: [place]   Lot: [id number]   [place]: [id number]

## 2019-09-07 ENCOUNTER — Other Ambulatory Visit: Payer: Self-pay

## 2019-09-07 DIAGNOSIS — E7841 Elevated Lipoprotein(a): Secondary | ICD-10-CM

## 2019-09-07 MED ORDER — ATORVASTATIN CALCIUM 20 MG PO TABS: 20.0000 mg | ORAL_TABLET | Freq: Every day | ORAL | 3 refills | Status: DC

## 2019-09-25 ENCOUNTER — Ambulatory Visit: Payer: PRIVATE HEALTH INSURANCE | Attending: Internal Medicine

## 2019-09-25 DIAGNOSIS — Z23 Encounter for immunization: Secondary | ICD-10-CM

## 2019-09-25 NOTE — Progress Notes (Signed)
   Covid-19 Vaccination Clinic  Name:  Destiny Harrell    MRN: [id number] DOB: [birth year]  09/25/2019  Ms. Destiny Harrell was observed post Covid-19 immunization for 15 minutes without incident. She was provided with Vaccine Information Sheet and instruction to access the V-Safe system.   Ms. Destiny Harrell was instructed to call 911 with any severe reactions post vaccine: Marland Kitchen Difficulty breathing  . Swelling of face and throat  . A fast heartbeat  . A bad rash all over body  . Dizziness and weakness   Immunizations Administered    Name Date Dose VIS Date Route   Pfizer COVID-19 Vaccine 09/25/2019  8:49 AM 0.3 mL 06/01/2019 Intramuscular   Manufacturer: [place]   Lot: [id number]   [place]: [id number]

## 2020-01-18 ENCOUNTER — Encounter: Payer: Self-pay | Admitting: Family Medicine

## 2020-01-18 ENCOUNTER — Ambulatory Visit (INDEPENDENT_AMBULATORY_CARE_PROVIDER_SITE_OTHER)
Admission: RE | Admit: 2020-01-18 | Discharge: 2020-01-18 | Disposition: A | Discharge status: Home / Self Care | Payer: No Typology Code available for payment source | Source: Ambulatory Visit | Attending: Family Medicine | Admitting: Family Medicine

## 2020-01-18 ENCOUNTER — Ambulatory Visit (INDEPENDENT_AMBULATORY_CARE_PROVIDER_SITE_OTHER): Payer: No Typology Code available for payment source | Admitting: Family Medicine

## 2020-01-18 ENCOUNTER — Other Ambulatory Visit: Payer: Self-pay

## 2020-01-18 VITALS — BP 132/80 | HR 55 | Temp 97.4°F | Ht 62.5 in | Wt 162.1 lb

## 2020-01-18 DIAGNOSIS — F17200 Nicotine dependence, unspecified, uncomplicated: Secondary | ICD-10-CM | POA: Diagnosis not present

## 2020-01-18 DIAGNOSIS — R0789 Other chest pain: Secondary | ICD-10-CM

## 2020-01-18 DIAGNOSIS — R202 Paresthesia of skin: Secondary | ICD-10-CM | POA: Diagnosis not present

## 2020-01-18 NOTE — Patient Instructions (Addendum)
Rib and chest xray now  Use ice and heat on the area  Change sitting and lying position  Ibuprofen-with food is helpful   We will contact you with results   ? Pinched nerve in hip/groin  Let's watch that  Mix up your exercise

## 2020-01-18 NOTE — Progress Notes (Signed)
Subjective:    Patient ID: Destiny Harrell, female    DOB: 1960/11/04, 59 y.o.   MRN: [id number]  This visit occurred during the SARS-CoV-2 public health emergency.  Safety protocols were in place, including screening questions prior to the visit, additional usage of staff PPE, and extensive cleaning of exam room while observing appropriate contact time as indicated for disinfecting solutions.    HPI 59 yo pt of NP Carlean Purl presents with R rib pain  Also leg numbness   Wt Readings from Last 3 Encounters:  01/18/20 162 lb 2 oz (73.5 kg)  07/06/19 169 lb (76.7 kg)  04/24/19 168 lb (76.2 kg)   29.18 kg/m  10 days ago-did a harness yoga class  She did get twisted up at one point- and felt a lot of pain in her rib  Thought it was bruised  It hurt to take a deep breath  Also to move certain directions  Pain became more persistent   No cough   Does smoke  Really trying to cut down and make a plan   This am -her R thigh felt numb  Still a bit numb  Has been doing a lot of walking  Also hiking   She tends to sleep on her L side    She is a current smoker  Also hx of breast cancer in the past   Patient Active Problem List   Diagnosis Date Noted  . Chest wall pain 01/18/2020  . Paresthesia of right leg 01/18/2020  . Smoker 01/18/2020  . Irritable bowel syndrome with constipation 04/05/2019  . Class 1 obesity due to excess calories without serious comorbidity with body mass index (BMI) of 30.0 to 30.9 in adult 04/05/2019  . Chronic pain of right knee 04/05/2019  . Cancer of left breast, stage 1 ([place]) 09/07/2012   Past Medical History:  Diagnosis Date  . Breast cancer ([place])   . Depression   . Hyperlipemia   . IBS (irritable bowel syndrome)   . Migraines   . Personal history of radiation therapy    Past Surgical History:  Procedure Laterality Date  . BREAST BIOPSY Left 08/16/2012   positive   . BREAST LUMPECTOMY Left 08/31/2012  . BREAST LUMPECTOMY WITH NEEDLE  LOCALIZATION AND AXILLARY SENTINEL LYMPH NODE BX Left 08/31/2012   Procedure: left BREAST needle localized  LUMPECTOMY WITH left sentinel lymph node mapping ;  Surgeon: Joyice Faster. Cornett, MD;  Location: [facility];  Service: General;  Laterality: Left;  needle localization at breast center of [place] 7:30   . BREAST SURGERY  08/31/12   ER+PR-HER-2neu-  . [place]  . CHOLECYSTECTOMY  2010  . COLONOSCOPY  04/24/2019  . OOPHORECTOMY  Right 1999, Left 2001  . POLYPECTOMY     Social History   Tobacco Use  . Smoking status: Current Every Day Smoker    Packs/day: 0.25    Years: 30.00    Pack years: 7.50    Types: Cigarettes    Start date: 04/03/1989  . Smokeless tobacco: Never Used  . Tobacco comment: 4 cigarettes per day   Vaping Use  . Vaping Use: Never used  Substance Use Topics  . Alcohol use: Yes    Comment: rare  . Drug use: No   Family History  Problem Relation Age of Onset  . Breast cancer Mother 24  . Arthritis Mother   . Hypertension Mother   . Cancer Father  Hodgkin Lymphoma  . Other Father        "twisted colon"  . Arthritis Sister   . Hyperlipidemia Sister   . Depression Brother   . Colon cancer Neg Hx   . Esophageal cancer Neg Hx   . Stomach cancer Neg Hx   . Rectal cancer Neg Hx    No Known Allergies Current Outpatient Medications on File Prior to Visit  Medication Sig Dispense Refill  . atorvastatin (LIPITOR) 20 MG tablet Take 1 tablet (20 mg total) by mouth daily. 90 tablet 3  . cholecalciferol (VITAMIN D3) 25 MCG (1000 UNIT) tablet Take 1,000 Units by mouth daily. 2 tabs per day    . Multiple Vitamins-Minerals (MULTIVITAMIN WITH MINERALS) tablet Take 1 tablet by mouth daily.     No current facility-administered medications on file prior to visit.    Review of Systems  Constitutional: Negative for activity change, appetite change, fatigue, fever and unexpected weight change.  HENT: Negative for congestion, ear  pain, rhinorrhea, sinus pressure and sore throat.   Eyes: Negative for pain, redness and visual disturbance.  Respiratory: Negative for cough, shortness of breath and wheezing.   Cardiovascular: Negative for chest pain and palpitations.  Gastrointestinal: Negative for abdominal pain, blood in stool, constipation and diarrhea.  Endocrine: Negative for polydipsia and polyuria.  Genitourinary: Negative for dysuria, frequency and urgency.  Musculoskeletal: Negative for arthralgias, back pain and myalgias.       Chest wall pain  Skin: Negative for pallor and rash.  Allergic/Immunologic: Negative for environmental allergies.  Neurological: Positive for numbness. Negative for dizziness, syncope and headaches.  Hematological: Negative for adenopathy. Does not bruise/bleed easily.  Psychiatric/Behavioral: Negative for decreased concentration and dysphoric mood. The patient is not nervous/anxious.        Objective:   Physical Exam Constitutional:      General: She is not in acute distress.    Appearance: Normal appearance. She is well-developed. She is not ill-appearing.     Comments: overweight  HENT:     Head: Normocephalic and atraumatic.     Mouth/Throat:     Mouth: Mucous membranes are moist.  Eyes:     General: No scleral icterus.    Conjunctiva/sclera: Conjunctivae normal.     Pupils: Pupils are equal, round, and reactive to light.  Neck:     Thyroid: No thyromegaly.     Vascular: No carotid bruit or JVD.  Cardiovascular:     Rate and Rhythm: Normal rate and regular rhythm.     Heart sounds: Normal heart sounds. No gallop.   Pulmonary:     Effort: Pulmonary effort is normal. No respiratory distress.     Breath sounds: Normal breath sounds. No wheezing or rales.     Comments: R anterior/low chest wall tenderness No step off or crepitus  Chest:     Chest wall: Tenderness present.  Abdominal:     General: Bowel sounds are normal. There is no distension or abdominal bruit.      Palpations: Abdomen is soft. There is no mass.     Tenderness: There is no abdominal tenderness. There is no guarding or rebound.     Hernia: No hernia is present.  Musculoskeletal:        General: Tenderness present.     Cervical back: Normal range of motion and neck supple.     Right lower leg: No edema.     Left lower leg: No edema.     Comments: Mild tenderness or  R greater trochanter  Nl rom of hips  Some worsening of discomfort with full int rot of R hip    Lymphadenopathy:     Cervical: No cervical adenopathy.  Skin:    General: Skin is warm and dry.     Findings: No rash.  Neurological:     Mental Status: She is alert.     Coordination: Coordination normal.     Gait: Gait normal.     Deep Tendon Reflexes: Reflexes are normal and symmetric. Reflexes normal.  Psychiatric:        Mood and Affect: Mood normal.           Assessment & Plan:   Problem List Items Addressed This Visit      Other   Chest wall pain - Primary    After injury using a harness  Now some pleuritic chest wall pain on R  In a smoker cxr and rib films today  Disc deep breaths to prevent atelectasis  Ibuprofen prn  Ice and heat inst to call if cough/fever or increased pain      Relevant Orders   DG Chest 2 View (Completed)   DG Ribs Unilateral Right (Completed)   Paresthesia of right leg    Possible paresthetica meralgia given location and recent new activity/increased walking  Also some trochanteric tenderness-disc possible bursitis Disc cross training and switching up exercise Ice prn   inst to update if no improvement      Smoker    Disc in detail risks of smoking and possible outcomes including copd, vascular/ heart disease, cancer , respiratory and sinus infections  Pt voices understanding Pt does want to eventually quit Cutting down

## 2020-01-20 NOTE — Assessment & Plan Note (Signed)
Possible paresthetica meralgia given location and recent new activity/increased walking  Also some trochanteric tenderness-disc possible bursitis Disc cross training and switching up exercise Ice prn   inst to update if no improvement

## 2020-01-20 NOTE — Assessment & Plan Note (Signed)
After injury using a harness  Now some pleuritic chest wall pain on R  In a smoker cxr and rib films today  Disc deep breaths to prevent atelectasis  Ibuprofen prn  Ice and heat inst to call if cough/fever or increased pain

## 2020-01-20 NOTE — Assessment & Plan Note (Signed)
Disc in detail risks of smoking and possible outcomes including copd, vascular/ heart disease, cancer , respiratory and sinus infections  Pt voices understanding Pt does want to eventually quit Cutting down

## 2020-02-23 ENCOUNTER — Emergency Department
Admission: EM | Admit: 2020-02-23 | Discharge: 2020-02-23 | Discharge status: Against Medical Advice | Payer: No Typology Code available for payment source

## 2020-02-26 ENCOUNTER — Other Ambulatory Visit: Payer: Self-pay | Admitting: Family Medicine

## 2020-02-26 ENCOUNTER — Encounter: Payer: Self-pay | Admitting: Family Medicine

## 2020-02-26 ENCOUNTER — Other Ambulatory Visit: Payer: Self-pay

## 2020-02-26 ENCOUNTER — Ambulatory Visit (INDEPENDENT_AMBULATORY_CARE_PROVIDER_SITE_OTHER)
Admission: RE | Admit: 2020-02-26 | Discharge: 2020-02-26 | Disposition: A | Discharge status: Home / Self Care | Payer: No Typology Code available for payment source | Source: Ambulatory Visit | Attending: Family Medicine | Admitting: Family Medicine

## 2020-02-26 ENCOUNTER — Telehealth: Payer: Self-pay | Admitting: Family Medicine

## 2020-02-26 ENCOUNTER — Ambulatory Visit: Payer: No Typology Code available for payment source | Admitting: Family Medicine

## 2020-02-26 DIAGNOSIS — S82839A Other fracture of upper and lower end of unspecified fibula, initial encounter for closed fracture: Secondary | ICD-10-CM | POA: Insufficient documentation

## 2020-02-26 DIAGNOSIS — S99912A Unspecified injury of left ankle, initial encounter: Secondary | ICD-10-CM | POA: Diagnosis not present

## 2020-02-26 DIAGNOSIS — S82832A Other fracture of upper and lower end of left fibula, initial encounter for closed fracture: Secondary | ICD-10-CM

## 2020-02-26 DIAGNOSIS — Z8781 Personal history of (healed) traumatic fracture: Secondary | ICD-10-CM | POA: Insufficient documentation

## 2020-02-26 NOTE — Assessment & Plan Note (Signed)
Inversion injury on Saturday with a fall  Pain and swelling in lateral malleolus area and foot  Xray now Ibuprofen prn with food 800 mg  Ace wrap- continue for compression Relative rest (passive rom if no fracture) Plan to follow

## 2020-02-26 NOTE — Telephone Encounter (Signed)
Ref done and sent to [facility]

## 2020-02-26 NOTE — Patient Instructions (Signed)
Wrap your ankle when not in bed  Use ice every chance you get  Continue crutches if it hurts too much to walk   Ibuprofen with food up to every 8 hours will help pain and inflammation   Xray now  Plan to follow

## 2020-02-26 NOTE — Telephone Encounter (Signed)
-----   Message from Gordy Councilman, [place] sent at 02/26/2020  3:38 PM EDT ----- I called and spoke with the patient and informed her of her xray results, patient prefers [facility] for Ortho.

## 2020-02-26 NOTE — Progress Notes (Signed)
Subjective:    Patient ID: Destiny Harrell, female    DOB: December 12, 1960, 59 y.o.   MRN: [id number]  This visit occurred during the SARS-CoV-2 public health emergency.  Safety protocols were in place, including screening questions prior to the visit, additional usage of staff PPE, and extensive cleaning of exam room while observing appropriate contact time as indicated for disinfecting solutions.    HPI Pt presents for ankle injury   59 yo pt of NP Carlean Purl who smokes   Was hiking on Saturday walking on pine needles/wet , tripped on something under  Slid , felt a pop and fell  Was wearing hiking boots  Was able to bear a little weight- hobbled her way out   Ankle did invert when she fell   Took off boot- pain got worse  Swelled immediately    Tried ER-too long a wait   Home- elevation /ice  Using crutches (putting very little weight on it)  Taking 800 mg of ibuprofen prn - am and afternoon   Not a lot of pain when not moving  Some aching last night   Broke that ankle 8 y ago as well  (? Bone it was)   Patient Active Problem List   Diagnosis Date Noted  . Left ankle injury 02/26/2020  . Fracture of distal fibula 02/26/2020  . Chest wall pain 01/18/2020  . Paresthesia of right leg 01/18/2020  . Smoker 01/18/2020  . Irritable bowel syndrome with constipation 04/05/2019  . Class 1 obesity due to excess calories without serious comorbidity with body mass index (BMI) of 30.0 to 30.9 in adult 04/05/2019  . Chronic pain of right knee 04/05/2019  . Cancer of left breast, stage 1 ([place]) 09/07/2012   Past Medical History:  Diagnosis Date  . Breast cancer ([place])   . Depression   . Hyperlipemia   . IBS (irritable bowel syndrome)   . Migraines   . Personal history of radiation therapy    Past Surgical History:  Procedure Laterality Date  . BREAST BIOPSY Left 08/16/2012   positive   . BREAST LUMPECTOMY Left 08/31/2012  . BREAST LUMPECTOMY WITH NEEDLE LOCALIZATION AND  AXILLARY SENTINEL LYMPH NODE BX Left 08/31/2012   Procedure: left BREAST needle localized  LUMPECTOMY WITH left sentinel lymph node mapping ;  Surgeon: Joyice Faster. Cornett, MD;  Location: [facility];  Service: General;  Laterality: Left;  needle localization at breast center of [place] 7:30   . BREAST SURGERY  08/31/12   ER+PR-HER-2neu-  . [place]  . CHOLECYSTECTOMY  2010  . COLONOSCOPY  04/24/2019  . OOPHORECTOMY  Right 1999, Left 2001  . POLYPECTOMY     Social History   Tobacco Use  . Smoking status: Current Every Day Smoker    Packs/day: 0.25    Years: 30.00    Pack years: 7.50    Types: Cigarettes    Start date: 04/03/1989  . Smokeless tobacco: Never Used  . Tobacco comment: 4 cigarettes per day   Vaping Use  . Vaping Use: Never used  Substance Use Topics  . Alcohol use: Yes    Comment: rare  . Drug use: No   Family History  Problem Relation Age of Onset  . Breast cancer Mother 79  . Arthritis Mother   . Hypertension Mother   . Cancer Father        Hodgkin Lymphoma  . Other Father        "  twisted colon"  . Arthritis Sister   . Hyperlipidemia Sister   . Depression Brother   . Colon cancer Neg Hx   . Esophageal cancer Neg Hx   . Stomach cancer Neg Hx   . Rectal cancer Neg Hx    No Known Allergies Current Outpatient Medications on File Prior to Visit  Medication Sig Dispense Refill  . atorvastatin (LIPITOR) 20 MG tablet Take 1 tablet (20 mg total) by mouth daily. 90 tablet 3  . cholecalciferol (VITAMIN D3) 25 MCG (1000 UNIT) tablet Take 1,000 Units by mouth daily. 2 tabs per day    . Multiple Vitamins-Minerals (MULTIVITAMIN WITH MINERALS) tablet Take 1 tablet by mouth daily.     No current facility-administered medications on file prior to visit.    Review of Systems  Constitutional: Negative for activity change, appetite change, fatigue, fever and unexpected weight change.  HENT: Negative for congestion, ear pain,  rhinorrhea, sinus pressure and sore throat.   Eyes: Negative for pain, redness and visual disturbance.  Respiratory: Negative for cough, shortness of breath and wheezing.   Cardiovascular: Negative for chest pain and palpitations.  Gastrointestinal: Negative for abdominal pain, blood in stool, constipation and diarrhea.  Endocrine: Negative for polydipsia and polyuria.  Genitourinary: Negative for dysuria, frequency and urgency.  Musculoskeletal: Negative for arthralgias, back pain and myalgias.       Left ankle pain and swelling after trauma  Skin: Negative for pallor and rash.  Allergic/Immunologic: Negative for environmental allergies.  Neurological: Negative for dizziness, syncope and headaches.  Hematological: Negative for adenopathy. Does not bruise/bleed easily.  Psychiatric/Behavioral: Negative for decreased concentration and dysphoric mood. The patient is not nervous/anxious.        Objective:   Physical Exam Constitutional:      General: She is not in acute distress.    Appearance: Normal appearance. She is obese. She is not ill-appearing.  Eyes:     General: No scleral icterus.    Conjunctiva/sclera: Conjunctivae normal.     Pupils: Pupils are equal, round, and reactive to light.  Cardiovascular:     Rate and Rhythm: Normal rate and regular rhythm.  Musculoskeletal:        General: Swelling present. No deformity.     Left ankle: Swelling present. No deformity or ecchymosis. Tenderness present over the lateral malleolus and posterior TF ligament. No base of 5th metatarsal or proximal fibula tenderness. Decreased range of motion. Anterior drawer test negative. Normal pulse.     Left Achilles Tendon: Normal.     Comments: Pain to dorsiflex  Good plantar flexion  Pain to invert  Swelling is lateral-over malleolus     Skin:    General: Skin is warm and dry.     Coloration: Skin is not pale.     Findings: No bruising, erythema or rash.  Neurological:     Mental Status:  She is alert.     Motor: No weakness.     Deep Tendon Reflexes: Reflexes normal.  Psychiatric:        Mood and Affect: Mood normal.           Assessment & Plan:   Problem List Items Addressed This Visit      Other   Left ankle injury    Inversion injury on Saturday with a fall  Pain and swelling in lateral malleolus area and foot  Xray now Ibuprofen prn with food 800 mg  Ace wrap- continue for compression Relative rest (passive rom if no  fracture) Plan to follow      Relevant Orders   DG Ankle Complete Left (Completed)   DG Foot Complete Left (Completed)

## 2020-02-27 ENCOUNTER — Telehealth: Payer: Self-pay | Admitting: Family Medicine

## 2020-02-27 NOTE — Telephone Encounter (Signed)
Disc has been made and put up front, patient notified

## 2020-02-27 NOTE — Telephone Encounter (Signed)
Pt called stating she found a orthopedic provider can see her tomorrow.  [facility]  She needs copy of xray and would like up today.   Please call when xray ready for pick up

## 2020-02-27 NOTE — Telephone Encounter (Signed)
Pt said she needs to know where we are going to send her for orthopedic surgery because she needs to be seen as soon as possible?

## 2020-03-03 NOTE — Telephone Encounter (Signed)
See referral notes.   Sending referral to Dr Smitty Cords as requested. Will reach out to the patient tomorrow 03/04/20 to see which location she is wanting to be seen at, [facility], [place] or [place].

## 2020-08-27 ENCOUNTER — Other Ambulatory Visit: Payer: Self-pay

## 2020-08-27 DIAGNOSIS — E7841 Elevated Lipoprotein(a): Secondary | ICD-10-CM

## 2020-08-27 MED ORDER — ATORVASTATIN CALCIUM 20 MG PO TABS: 20.0000 mg | ORAL_TABLET | Freq: Every day | ORAL | 1 refills | Status: DC

## 2020-08-27 NOTE — Telephone Encounter (Signed)
Pharmacy requests refill on: Atorvastatin 20 mg   LAST REFILL: 03/09/2020 (Q-90, R-3) LAST OV: 02/26/2020 NEXT OV: Not Scheduled  PHARMACY: [facility]

## 2020-10-13 ENCOUNTER — Telehealth: Payer: Self-pay | Admitting: Family Medicine

## 2020-10-13 DIAGNOSIS — Z0000 Encounter for general adult medical examination without abnormal findings: Secondary | ICD-10-CM | POA: Insufficient documentation

## 2020-10-13 DIAGNOSIS — E559 Vitamin D deficiency, unspecified: Secondary | ICD-10-CM | POA: Insufficient documentation

## 2020-10-13 NOTE — Telephone Encounter (Signed)
-----   Message from Ellamae Sia sent at 09/29/2020  2:25 PM EDT ----- Regarding: Lab orders for Tuesday, 4.26.22 Patient is scheduled for CPX labs, please order future labs, Thanks , Karna Christmas

## 2020-10-14 ENCOUNTER — Other Ambulatory Visit: Payer: Self-pay

## 2020-10-14 ENCOUNTER — Other Ambulatory Visit (INDEPENDENT_AMBULATORY_CARE_PROVIDER_SITE_OTHER): Payer: No Typology Code available for payment source

## 2020-10-14 DIAGNOSIS — Z0000 Encounter for general adult medical examination without abnormal findings: Secondary | ICD-10-CM

## 2020-10-14 DIAGNOSIS — E559 Vitamin D deficiency, unspecified: Secondary | ICD-10-CM

## 2020-10-14 LAB — CBC WITH DIFFERENTIAL/PLATELET
Basophils Absolute: 0.1 10*3/uL (ref 0.0–0.1)
Basophils Relative: 1.1 % (ref 0.0–3.0)
Eosinophils Absolute: 0.1 10*3/uL (ref 0.0–0.7)
Eosinophils Relative: 1.5 % (ref 0.0–5.0)
HCT: 42.2 % (ref 36.0–46.0)
Hemoglobin: 14.5 g/dL (ref 12.0–15.0)
Lymphocytes Relative: 31.9 % (ref 12.0–46.0)
Lymphs Abs: 2.2 10*3/uL (ref 0.7–4.0)
MCHC: 34.3 g/dL (ref 30.0–36.0)
MCV: 93 fl (ref 78.0–100.0)
Monocytes Absolute: 0.5 10*3/uL (ref 0.1–1.0)
Monocytes Relative: 7.7 % (ref 3.0–12.0)
Neutro Abs: 4 10*3/uL (ref 1.4–7.7)
Neutrophils Relative %: 57.8 % (ref 43.0–77.0)
Platelets: 386 10*3/uL (ref 150.0–400.0)
RBC: 4.54 Mil/uL (ref 3.87–5.11)
RDW: 13.2 % (ref 11.5–15.5)
WBC: 6.9 10*3/uL (ref 4.0–10.5)

## 2020-10-14 LAB — COMPREHENSIVE METABOLIC PANEL
ALT: 25 U/L (ref 0–35)
AST: 25 U/L (ref 0–37)
Albumin: 4.2 g/dL (ref 3.5–5.2)
Alkaline Phosphatase: 57 U/L (ref 39–117)
BUN: 14 mg/dL (ref 6–23)
CO2: 28 mEq/L (ref 19–32)
Calcium: 10 mg/dL (ref 8.4–10.5)
Chloride: 104 mEq/L (ref 96–112)
Creatinine, Ser: 0.73 mg/dL (ref 0.40–1.20)
GFR: 90.35 mL/min (ref >60.00)
Glucose, Bld: 97 mg/dL (ref 70–99)
Potassium: 5 mEq/L (ref 3.5–5.1)
Sodium: 139 mEq/L (ref 135–145)
Total Bilirubin: 0.6 mg/dL (ref 0.2–1.2)
Total Protein: 7.3 g/dL (ref 6.0–8.3)

## 2020-10-14 LAB — LIPID PANEL
Cholesterol: 197 mg/dL (ref 0–200)
HDL: 54.4 mg/dL (ref >39.00)
LDL Cholesterol: 124 mg/dL — ABNORMAL HIGH (ref 0–99)
NonHDL: 142.2
Total CHOL/HDL Ratio: 4
Triglycerides: 90 mg/dL (ref 0.0–149.0)
VLDL: 18 mg/dL (ref 0.0–40.0)

## 2020-10-14 LAB — VITAMIN D 25 HYDROXY (VIT D DEFICIENCY, FRACTURES): VITD: 42.33 ng/mL (ref 30.00–100.00)

## 2020-10-14 LAB — TSH: TSH: 2.2 u[IU]/mL (ref 0.35–4.50)

## 2020-10-21 ENCOUNTER — Other Ambulatory Visit: Payer: Self-pay

## 2020-10-21 ENCOUNTER — Ambulatory Visit (INDEPENDENT_AMBULATORY_CARE_PROVIDER_SITE_OTHER): Payer: No Typology Code available for payment source | Admitting: Family Medicine

## 2020-10-21 ENCOUNTER — Encounter: Payer: Self-pay | Admitting: Family Medicine

## 2020-10-21 VITALS — BP 122/76 | HR 64 | Temp 97.0°F | Ht 62.5 in | Wt 159.1 lb

## 2020-10-21 DIAGNOSIS — E785 Hyperlipidemia, unspecified: Secondary | ICD-10-CM | POA: Insufficient documentation

## 2020-10-21 DIAGNOSIS — E7800 Pure hypercholesterolemia, unspecified: Secondary | ICD-10-CM | POA: Diagnosis not present

## 2020-10-21 DIAGNOSIS — E559 Vitamin D deficiency, unspecified: Secondary | ICD-10-CM

## 2020-10-21 DIAGNOSIS — Z0000 Encounter for general adult medical examination without abnormal findings: Secondary | ICD-10-CM | POA: Diagnosis not present

## 2020-10-21 DIAGNOSIS — F17200 Nicotine dependence, unspecified, uncomplicated: Secondary | ICD-10-CM

## 2020-10-21 MED ORDER — ROSUVASTATIN CALCIUM 5 MG PO TABS: 5.0000 mg | ORAL_TABLET | Freq: Every day | ORAL | 3 refills | Status: DC

## 2020-10-21 NOTE — Assessment & Plan Note (Signed)
Disc in detail risks of smoking and possible outcomes including copd, vascular/ heart disease, cancer , respiratory and sinus infections  Pt voices understanding She has cut to 4 cig per day  Not ready to fully quit

## 2020-10-21 NOTE — Patient Instructions (Addendum)
Get your 2nd [facility]   Get your mammogram when you can    Check with your insurance to see if a bone density test is covered and if so, when   Try generic crestor 5 mg daily  If side effects stop it and alert [place] Let's check cholesterol in about 6 weeks Avoid red meat/ fried foods/ egg yolks/ fatty breakfast meats/ butter, cheese and high fat dairy/ and shellfish

## 2020-10-21 NOTE — Progress Notes (Signed)
Subjective:    Patient ID: Destiny Harrell, female    DOB: [birth year], 60 y.o.   MRN: [id number]  This visit occurred during the SARS-CoV-2 public health emergency.  Safety protocols were in place, including screening questions prior to the visit, additional usage of staff PPE, and extensive cleaning of exam room while observing appropriate contact time as indicated for disinfecting solutions.    HPI 60 yo pt of NP Carlean Purl presents to establish care and also for health mt exam and review of chronic medical problems   Wt Readings from Last 3 Encounters:  10/21/20 159 lb 1 oz (72.2 kg)  02/26/20 163 lb 4.8 oz (74.1 kg)  01/18/20 162 lb 2 oz (73.5 kg)   28.63 kg/m  Taking care of herself  Walking Has a new puppy  Able to start back with hiking now that ankle has healed   covid status-immunized  Flu shot  Tdap 10/20 Zoster status -got the shingrix last June and needs the 2nd one  At [facility]   Mammogram 1/21-needs a mammogram but insurance did not cover her mammogram  Self exam -no lumps  Personal h/o cancer 2014   Colonoscopy 11/20 with 3 y recall (? 5 y for polyps adenomatous)  Pap 1/21 nl with neg HPV   5 y recall  Menses - post menopausal  Had ovaries removed due to cysts    Smoking status - 4 cig per day   H/o vit d def Recent level of 42.3  Had ankle fracture this year  Interested in dexa if covered   BP Readings from Last 3 Encounters:  10/21/20 122/76  02/26/20 117/70  01/18/20 (!) 132/80   Pulse Readings from Last 3 Encounters:  10/21/20 64  02/26/20 84  01/18/20 55     Cholesterol  Lab Results  Component Value Date   CHOL 197 10/14/2020   CHOL 255 (H) 04/04/2019   Lab Results  Component Value Date   HDL 54.40 10/14/2020   HDL 59.10 04/04/2019   Lab Results  Component Value Date   LDLCALC 124 (H) 10/14/2020   LDLCALC 167 (H) 04/04/2019   Lab Results  Component Value Date   TRIG 90.0 10/14/2020   TRIG 145.0 04/04/2019   Lab Results   Component Value Date   CHOLHDL 4 10/14/2020   CHOLHDL 4 04/04/2019   No results found for: LDLDIRECT Takes atorvastatin 20 mg daily, stopped but then re started 2 wk ago  Side effect - joint and muscle pain  Diet is good  Genetic high cholesterol   The 10-year ASCVD risk score Mikey Bussing DC Jr., et al., 2013) is: 5.4%   Values used to calculate the score:     Age: 78 years     Sex: Female     Is Non-Hispanic African American: No     Diabetic: No     Tobacco smoker: Yes     Systolic Blood Pressure: 671 mmHg     Is BP treated: No     HDL Cholesterol: 54.4 mg/dL     Total Cholesterol: 197 mg/dL    Other labs Lab Results  Component Value Date   CREATININE 0.73 10/14/2020   BUN 14 10/14/2020   NA 139 10/14/2020   K 5.0 10/14/2020   CL 104 10/14/2020   CO2 28 10/14/2020   Lab Results  Component Value Date   ALT 25 10/14/2020   AST 25 10/14/2020   ALKPHOS 57 10/14/2020   BILITOT 0.6 10/14/2020  Lab Results  Component Value Date   WBC 6.9 10/14/2020   HGB 14.5 10/14/2020   HCT 42.2 10/14/2020   MCV 93.0 10/14/2020   PLT 386.0 10/14/2020   Lab Results  Component Value Date   TSH 2.20 10/14/2020   Patient Active Problem List   Diagnosis Date Noted  . Hyperlipidemia 10/21/2020  . Routine general medical examination at a health care facility 10/13/2020  . Vitamin D deficiency 10/13/2020  . Left ankle injury 02/26/2020  . Fracture of distal fibula 02/26/2020  . Chest wall pain 01/18/2020  . Paresthesia of right leg 01/18/2020  . Smoker 01/18/2020  . Irritable bowel syndrome with constipation 04/05/2019  . Class 1 obesity due to excess calories without serious comorbidity with body mass index (BMI) of 30.0 to 30.9 in adult 04/05/2019  . Chronic pain of right knee 04/05/2019  . Cancer of left breast, stage 1 ([place]) 09/07/2012   Past Medical History:  Diagnosis Date  . Breast cancer ([place])   . Depression   . Hyperlipemia   . IBS (irritable bowel syndrome)   .  Migraines   . Personal history of radiation therapy    Past Surgical History:  Procedure Laterality Date  . BREAST BIOPSY Left 08/16/2012   positive   . BREAST LUMPECTOMY Left 08/31/2012  . BREAST LUMPECTOMY WITH NEEDLE LOCALIZATION AND AXILLARY SENTINEL LYMPH NODE BX Left 08/31/2012   Procedure: left BREAST needle localized  LUMPECTOMY WITH left sentinel lymph node mapping ;  Surgeon: Joyice Faster. Cornett, MD;  Location: [facility];  Service: General;  Laterality: Left;  needle localization at breast center of [place] 7:30   . BREAST SURGERY  08/31/12   ER+PR-HER-2neu-  . [place]  . CHOLECYSTECTOMY  2010  . COLONOSCOPY  04/24/2019  . OOPHORECTOMY  Right 1999, Left 2001  . POLYPECTOMY     Social History   Tobacco Use  . Smoking status: Current Every Day Smoker    Packs/day: 0.25    Years: 30.00    Pack years: 7.50    Types: Cigarettes    Start date: 04/03/1989  . Smokeless tobacco: Never Used  . Tobacco comment: 4 cigarettes per day   Vaping Use  . Vaping Use: Never used  Substance Use Topics  . Alcohol use: Yes    Comment: rare  . Drug use: No   Family History  Problem Relation Age of Onset  . Breast cancer Mother 53  . Arthritis Mother   . Hypertension Mother   . Cancer Father        Hodgkin Lymphoma  . Other Father        "twisted colon"  . Arthritis Sister   . Hyperlipidemia Sister   . Depression Brother   . Colon cancer Neg Hx   . Esophageal cancer Neg Hx   . Stomach cancer Neg Hx   . Rectal cancer Neg Hx    No Known Allergies Current Outpatient Medications on File Prior to Visit  Medication Sig Dispense Refill  . cholecalciferol (VITAMIN D3) 25 MCG (1000 UNIT) tablet Take 1,000 Units by mouth daily. 2 tabs per day    . Multiple Vitamins-Minerals (MULTIVITAMIN WITH MINERALS) tablet Take 1 tablet by mouth daily.     No current facility-administered medications on file prior to visit.     Review of Systems   Constitutional: Negative for activity change, appetite change, fatigue, fever and unexpected weight change.  HENT: Negative for congestion, ear pain,  rhinorrhea, sinus pressure and sore throat.   Eyes: Negative for pain, redness and visual disturbance.  Respiratory: Negative for cough, shortness of breath and wheezing.   Cardiovascular: Negative for chest pain and palpitations.  Gastrointestinal: Negative for abdominal pain, blood in stool, constipation and diarrhea.  Endocrine: Negative for polydipsia and polyuria.  Genitourinary: Negative for dysuria, frequency and urgency.  Musculoskeletal: Negative for arthralgias, back pain and myalgias.       Ankle pain is improved  Skin: Negative for pallor and rash.  Allergic/Immunologic: Negative for environmental allergies.  Neurological: Negative for dizziness, syncope and headaches.  Hematological: Negative for adenopathy. Does not bruise/bleed easily.  Psychiatric/Behavioral: Negative for decreased concentration and dysphoric mood. The patient is not nervous/anxious.        Objective:   Physical Exam Constitutional:      General: She is not in acute distress.    Appearance: Normal appearance. She is well-developed and normal weight. She is not ill-appearing or diaphoretic.  HENT:     Head: Normocephalic and atraumatic.     Right Ear: Tympanic membrane, ear canal and external ear normal.     Left Ear: Tympanic membrane, ear canal and external ear normal.     Nose: Nose normal. No congestion.     Mouth/Throat:     Mouth: Mucous membranes are moist.     Pharynx: Oropharynx is clear. No posterior oropharyngeal erythema.  Eyes:     General: No scleral icterus.    Extraocular Movements: Extraocular movements intact.     Conjunctiva/sclera: Conjunctivae normal.     Pupils: Pupils are equal, round, and reactive to light.  Neck:     Thyroid: No thyromegaly.     Vascular: No carotid bruit or JVD.  Cardiovascular:     Rate and Rhythm:  Normal rate and regular rhythm.     Pulses: Normal pulses.     Heart sounds: Normal heart sounds. No gallop.   Pulmonary:     Effort: Pulmonary effort is normal. No respiratory distress.     Breath sounds: Normal breath sounds. No wheezing.     Comments: Good air exch Chest:     Chest wall: No tenderness.  Abdominal:     General: Bowel sounds are normal. There is no distension or abdominal bruit.     Palpations: Abdomen is soft. There is no mass.     Tenderness: There is no abdominal tenderness.     Hernia: No hernia is present.  Genitourinary:    Comments: Breast exam: No mass, nodules, thickening, tenderness, bulging, retraction, inflamation, nipple discharge or skin changes noted.  No axillary or clavicular LA.     Musculoskeletal:        General: No tenderness. Normal range of motion.     Cervical back: Normal range of motion and neck supple. No rigidity. No muscular tenderness.     Right lower leg: No edema.     Left lower leg: No edema.     Comments: No kyphosis   Lymphadenopathy:     Cervical: No cervical adenopathy.  Skin:    General: Skin is warm and dry.     Coloration: Skin is not pale.     Findings: No erythema or rash.     Comments: Solar lentigines diffusely   Neurological:     Mental Status: She is alert. Mental status is at baseline.     Cranial Nerves: No cranial nerve deficit.     Motor: No abnormal muscle tone.  Coordination: Coordination normal.     Gait: Gait normal.     Deep Tendon Reflexes: Reflexes are normal and symmetric. Reflexes normal.  Psychiatric:        Mood and Affect: Mood normal.        Cognition and Memory: Cognition and memory normal.           Assessment & Plan:   Problem List Items Addressed This Visit      Other   Smoker    Disc in detail risks of smoking and possible outcomes including copd, vascular/ heart disease, cancer , respiratory and sinus infections  Pt voices understanding She has cut to 4 cig per day  Not  ready to fully quit      Routine general medical examination at a health care facility - Primary    Reviewed health habits including diet and exercise and skin cancer prevention Reviewed appropriate screening tests for age  Also reviewed health mt list, fam hx and immunization status , as well as social and family history   See HPI Labs reviewed  Enc smoking cessation  Pap utd Needs mammogram but has a billing issue to resolve (personal hx of breast cancer) and may need help with this colonoscopy 11/20 with 3-5 y recall for polyps  Would like to get dexa in light of fracture and smoking status  Pt plans to see where this may be covered and let us kno w      Vitamin D deficiency    D level of 42.3 on current dose Vitamin D level is therapeutic with current supplementation Disc importance of this to bone and overall health       Hyperlipidemia    Disc goals for lipids and reasons to control them Rev last labs with pt Rev low sat fat diet in detail Intolerant of atorvastatin Will try low dose crestor 5 mg  inst to call if side eff/muscle pain  Consider twice weekly if needed  zetia if not able to tolerate at all Diet is good      Relevant Medications   rosuvastatin (CRESTOR) 5 MG tablet

## 2020-10-21 NOTE — Assessment & Plan Note (Signed)
Disc goals for lipids and reasons to control them Rev last labs with pt Rev low sat fat diet in detail Intolerant of atorvastatin Will try low dose crestor 5 mg  inst to call if side eff/muscle pain  Consider twice weekly if needed  zetia if not able to tolerate at all Diet is good

## 2020-10-21 NOTE — Assessment & Plan Note (Signed)
D level of 42.3 on current dose Vitamin D level is therapeutic with current supplementation Disc importance of this to bone and overall health

## 2020-10-21 NOTE — Assessment & Plan Note (Signed)
Reviewed health habits including diet and exercise and skin cancer prevention Reviewed appropriate screening tests for age  Also reviewed health mt list, fam hx and immunization status , as well as social and family history   See HPI Labs reviewed  Enc smoking cessation  Pap utd Needs mammogram but has a billing issue to resolve (personal hx of breast cancer) and may need help with this colonoscopy 11/20 with 3-5 y recall for polyps  Would like to get dexa in light of fracture and smoking status  Pt plans to see where this may be covered and let us kno w

## 2021-02-25 ENCOUNTER — Other Ambulatory Visit: Payer: Self-pay | Admitting: Family Medicine

## 2021-02-25 DIAGNOSIS — Z1231 Encounter for screening mammogram for malignant neoplasm of breast: Secondary | ICD-10-CM

## 2021-03-09 ENCOUNTER — Ambulatory Visit
Admission: RE | Admit: 2021-03-09 | Discharge: 2021-03-09 | Disposition: A | Discharge status: Home / Self Care | Payer: No Typology Code available for payment source | Source: Ambulatory Visit | Attending: Family Medicine | Admitting: Family Medicine

## 2021-03-09 ENCOUNTER — Other Ambulatory Visit: Payer: Self-pay

## 2021-03-09 DIAGNOSIS — Z1231 Encounter for screening mammogram for malignant neoplasm of breast: Secondary | ICD-10-CM | POA: Diagnosis not present

## 2021-05-12 ENCOUNTER — Telehealth (INDEPENDENT_AMBULATORY_CARE_PROVIDER_SITE_OTHER): Payer: No Typology Code available for payment source | Admitting: Primary Care

## 2021-05-12 ENCOUNTER — Encounter: Payer: Self-pay | Admitting: Primary Care

## 2021-05-12 DIAGNOSIS — J4520 Mild intermittent asthma, uncomplicated: Secondary | ICD-10-CM

## 2021-05-12 DIAGNOSIS — J3089 Other allergic rhinitis: Secondary | ICD-10-CM

## 2021-05-12 MED ORDER — ALBUTEROL SULFATE HFA 108 (90 BASE) MCG/ACT IN AERS: 2.0000 | INHALATION_SPRAY | RESPIRATORY_TRACT | 0 refills | Status: DC | PRN

## 2021-05-12 MED ORDER — PREDNISONE 20 MG PO TABS: ORAL_TABLET | ORAL | 0 refills | Status: DC

## 2021-05-12 NOTE — Assessment & Plan Note (Signed)
No issues in years.  Could be experiencing a mild flare, she does appear stable today.  Rx for albuterol inhaler provided to use PRN. Rx for prednisone 40 mg once daily x 5 days sent to pharmacy.  We discussed for her to come into the office for evaluation if symptoms do not improve. She agrees.

## 2021-05-12 NOTE — Progress Notes (Signed)
Patient ID: Destiny Harrell, female    DOB: September 24, 1960, 60 y.o.   MRN: [id number]  Virtual visit completed through [employer], a video enabled telemedicine application. Due to national recommendations of social distancing due to COVID-19, a virtual visit is felt to be most appropriate for this patient at this time. Reviewed limitations, risks, security and privacy concerns of performing a virtual visit and the availability of in person appointments. I also reviewed that there may be a patient responsible charge related to this service. The patient agreed to proceed.   Patient location: home Provider location: [place] [facility], office Persons participating in this virtual visit: patient, provider   If any vitals were documented, they were collected by patient at home unless specified below.    LMP 06/22/1999 (Approximate)    CC: Nasal congestion Subjective:   HPI: Destiny Harrell is a 60 y.o. female patient of [facility] with a history of seasonal allergies and asthma, allergies to cats and dogs presenting on 05/12/2021 for nasal congestion.  She also reports chest tightness. Symptoms of chest tightness and mild SOB began one week ago when turning on her heat for the cooler months. Her nasal congestion improves when at work away from her dog.   Typically she gets seasonal allergies with weather changes, mostly mild symptoms. Severe allergies in her 20's to mid 40's, no trouble over the last decade until Covid-19 infection in August 2022. She was once managed on albuterol PRN, does not have an inhaler on hand.   She had Covid-19 infection in August 2022, has had sinus and nasal congestion, sneezing off and on since.   She does notice mild SOB with wheezing at times.         Relevant past medical, surgical, family and social history reviewed and updated as indicated. Interim medical history since our last visit reviewed. Allergies and medications reviewed and  updated. Outpatient Medications Prior to Visit  Medication Sig Dispense Refill   Multiple Vitamins-Minerals (MULTIVITAMIN WITH MINERALS) tablet Take 1 tablet by mouth daily.     Phenylephrine-DM-GG (MUCINEX FAST-MAX CONGEST COUGH PO) Take by mouth as needed.     rosuvastatin (CRESTOR) 5 MG tablet Take 1 tablet (5 mg total) by mouth daily. 90 tablet 3   cholecalciferol (VITAMIN D3) 25 MCG (1000 UNIT) tablet Take 1,000 Units by mouth daily. 2 tabs per day (Patient not taking: Reported on 05/12/2021)     No facility-administered medications prior to visit.     Per HPI unless specifically indicated in ROS section below Review of Systems  Constitutional:  Negative for fever.  HENT:  Positive for congestion.   Respiratory:  Positive for chest tightness, shortness of breath and wheezing. Negative for cough.   Objective:  LMP 06/22/1999 (Approximate)   Wt Readings from Last 3 Encounters:  10/21/20 159 lb 1 oz (72.2 kg)  02/26/20 163 lb 4.8 oz (74.1 kg)  01/18/20 162 lb 2 oz (73.5 kg)       Physical exam: General: Alert and oriented x 3, no distress, does not appear sickly  Pulmonary: Speaks in complete sentences without increased work of breathing, no cough during visit.  Psychiatric: Normal mood, thought content, and behavior.     Results for orders placed or performed in visit on 10/14/20  TSH  Result Value Ref Range   TSH 2.20 0.35 - 4.50 uIU/mL  Lipid panel  Result Value Ref Range   Cholesterol 197 0 - 200 mg/dL   Triglycerides 90.0  0.0 - 149.0 mg/dL   HDL 54.40 >39.00 mg/dL   VLDL 18.0 0.0 - 40.0 mg/dL   LDL Cholesterol 124 (H) 0 - 99 mg/dL   Total CHOL/HDL Ratio 4    NonHDL 142.20   Comprehensive metabolic panel  Result Value Ref Range   Sodium 139 135 - 145 mEq/L   Potassium 5.0 3.5 - 5.1 mEq/L   Chloride 104 96 - 112 mEq/L   CO2 28 19 - 32 mEq/L   Glucose, Bld 97 70 - 99 mg/dL   BUN 14 6 - 23 mg/dL   Creatinine, Ser 0.73 0.40 - 1.20 mg/dL   Total Bilirubin 0.6  0.2 - 1.2 mg/dL   Alkaline Phosphatase 57 39 - 117 U/L   AST 25 0 - 37 U/L   ALT 25 0 - 35 U/L   Total Protein 7.3 6.0 - 8.3 g/dL   Albumin 4.2 3.5 - 5.2 g/dL   GFR 90.35 >60.00 mL/min   Calcium 10.0 8.4 - 10.5 mg/dL  CBC with Differential/Platelet  Result Value Ref Range   WBC 6.9 4.0 - 10.5 K/uL   RBC 4.54 3.87 - 5.11 Mil/uL   Hemoglobin 14.5 12.0 - 15.0 g/dL   HCT 42.2 36.0 - 46.0 %   MCV 93.0 78.0 - 100.0 fl   MCHC 34.3 30.0 - 36.0 g/dL   RDW 13.2 11.5 - 15.5 %   Platelets 386.0 150.0 - 400.0 K/uL   Neutrophils Relative % 57.8 43.0 - 77.0 %   Lymphocytes Relative 31.9 12.0 - 46.0 %   Monocytes Relative 7.7 3.0 - 12.0 %   Eosinophils Relative 1.5 0.0 - 5.0 %   Basophils Relative 1.1 0.0 - 3.0 %   Neutro Abs 4.0 1.4 - 7.7 K/uL   Lymphs Abs 2.2 0.7 - 4.0 K/uL   Monocytes Absolute 0.5 0.1 - 1.0 K/uL   Eosinophils Absolute 0.1 0.0 - 0.7 K/uL   Basophils Absolute 0.1 0.0 - 0.1 K/uL  VITAMIN D 25 Hydroxy (Vit-D Deficiency, Fractures)  Result Value Ref Range   VITD 42.33 30.00 - 100.00 ng/mL   Assessment & Plan:   Problem List Items Addressed This Visit       Respiratory   Mild intermittent asthma without complication    No issues in years.  Could be experiencing a mild flare, she does appear stable today.  Rx for albuterol inhaler provided to use PRN. Rx for prednisone 40 mg once daily x 5 days sent to pharmacy.  We discussed for her to come into the office for evaluation if symptoms do not improve. She agrees.      Relevant Medications   albuterol (VENTOLIN HFA) 108 (90 Base) MCG/ACT inhaler   predniSONE (DELTASONE) 20 MG tablet     Other   Environmental and seasonal allergies - Primary    Chronic, overall mild symptoms until now. Could be experiencing a mild flare, she does appear stable today.  Rx for albuterol inhaler provided to use PRN. Rx for prednisone 40 mg once daily x 5 days sent to pharmacy.  We discussed for her to come into the office for  evaluation if symptoms do not improve. She agrees.      Relevant Medications   albuterol (VENTOLIN HFA) 108 (90 Base) MCG/ACT inhaler   predniSONE (DELTASONE) 20 MG tablet     Meds ordered this encounter  Medications   albuterol (VENTOLIN HFA) 108 (90 Base) MCG/ACT inhaler    Sig: Inhale 2 puffs into the lungs every 4 (  four) hours as needed for shortness of breath.    Dispense:  1 each    Refill:  0    Order Specific Question:   Supervising Provider    Answer:   BEDSOLE, AMY E [[id number]]   predniSONE (DELTASONE) 20 MG tablet    Sig: Take 2 tablets by mouth once daily for 5 days.    Dispense:  10 tablet    Refill:  0    Order Specific Question:   Supervising Provider    Answer:   BEDSOLE, AMY E [[id number]]   No orders of the defined types were placed in this encounter.   I discussed the assessment and treatment plan with the patient. The patient was provided an opportunity to ask questions and all were answered. The patient agreed with the plan and demonstrated an understanding of the instructions. The patient was advised to call back or seek an in-person evaluation if the symptoms worsen or if the condition fails to improve as anticipated.  Follow up plan:  Start prednisone 20 mg tablets. Take 2 tablets by mouth once daily for 5 days.  Shortness of Breath/Wheezing/Cough: Use the albuterol inhaler. Inhale 2 puffs into the lungs every 4 to 6 hours as needed for wheezing, cough, and/or shortness of breath.   Please come see [place] if no improvement as discussed.   It was a pleasure to see you today!   Pleas Koch, NP

## 2021-05-12 NOTE — Patient Instructions (Signed)
Start prednisone 20 mg tablets. Take 2 tablets by mouth once daily for 5 days.  Shortness of Breath/Wheezing/Cough: Use the albuterol inhaler. Inhale 2 puffs into the lungs every 4 to 6 hours as needed for wheezing, cough, and/or shortness of breath.   Please come see [place] if no improvement as discussed.   It was a pleasure to see you today!

## 2021-05-12 NOTE — Assessment & Plan Note (Signed)
Chronic, overall mild symptoms until now. Could be experiencing a mild flare, she does appear stable today.  Rx for albuterol inhaler provided to use PRN. Rx for prednisone 40 mg once daily x 5 days sent to pharmacy.  We discussed for her to come into the office for evaluation if symptoms do not improve. She agrees.

## 2021-05-26 ENCOUNTER — Ambulatory Visit: Payer: No Typology Code available for payment source | Admitting: Family Medicine

## 2021-05-26 ENCOUNTER — Encounter: Payer: Self-pay | Admitting: Family Medicine

## 2021-05-26 ENCOUNTER — Ambulatory Visit (INDEPENDENT_AMBULATORY_CARE_PROVIDER_SITE_OTHER): Payer: No Typology Code available for payment source

## 2021-05-26 ENCOUNTER — Other Ambulatory Visit: Payer: Self-pay

## 2021-05-26 VITALS — BP 132/76 | HR 79 | Temp 97.5°F | Ht 62.0 in | Wt 161.0 lb

## 2021-05-26 DIAGNOSIS — F17200 Nicotine dependence, unspecified, uncomplicated: Secondary | ICD-10-CM

## 2021-05-26 DIAGNOSIS — J3089 Other allergic rhinitis: Secondary | ICD-10-CM

## 2021-05-26 DIAGNOSIS — J4520 Mild intermittent asthma, uncomplicated: Secondary | ICD-10-CM | POA: Diagnosis not present

## 2021-05-26 DIAGNOSIS — R0789 Other chest pain: Secondary | ICD-10-CM | POA: Insufficient documentation

## 2021-05-26 DIAGNOSIS — F430 Acute stress reaction: Secondary | ICD-10-CM

## 2021-05-26 MED ORDER — ALBUTEROL SULFATE HFA 108 (90 BASE) MCG/ACT IN AERS: 2.0000 | INHALATION_SPRAY | RESPIRATORY_TRACT | 3 refills | Status: DC | PRN

## 2021-05-26 NOTE — Patient Instructions (Addendum)
Ekg today  Chest xray today   Vitals and exam and oxygen look good    I refilled albuterol Watch out for wheezing  Think hard about quitting smoking   If symptoms return and you have trouble breathing please go to the ER

## 2021-05-26 NOTE — Assessment & Plan Note (Signed)
Unsure if this is from asthma/breathing or possibly anxiety (stress reaction) Some improved now  EKG and CXR ordered  May want to consider PFTs in the future in light of asthma /smoking history  Reassuring vitals and exam today

## 2021-05-26 NOTE — Assessment & Plan Note (Signed)
In a smoker  Reviewed last note from visit 11/22 She did have some improvement with prednisone  Finds albuterol handy-uses at night prn  Has cut down smoking and may be open to quitting later  Unsure if her feeling of chest tightness correlates to asthma cxr and EKG ordered

## 2021-05-26 NOTE — Assessment & Plan Note (Signed)
Has cut to under 1/2 ppd  Working on it  Disc in detail risks of smoking and possible outcomes including copd, vascular/ heart disease, cancer , respiratory and sinus infections  Pt voices understanding

## 2021-05-26 NOTE — Assessment & Plan Note (Signed)
Improved now.   

## 2021-05-26 NOTE — Assessment & Plan Note (Signed)
Stressors including sick family and work  Exp some feelings of rapid HR and also chest tightness and panic  Has been on prednisone-may inc anxiety symptoms Reviewed stressors/ coping techniques/symptoms/ support sources/ tx options and side effects in detail today She has had counseling and effexor in the past -does not feel like she needs either quite yet Discussed imp of self care -especially moderate exercise

## 2021-05-26 NOTE — Progress Notes (Signed)
Subjective:    Patient ID: Destiny Harrell, female    DOB: [birth year], 60 y.o.   MRN: [id number]  This visit occurred during the SARS-CoV-2 public health emergency.  Safety protocols were in place, including screening questions prior to the visit, additional usage of staff PPE, and extensive cleaning of exam room while observing appropriate contact time as indicated for disinfecting solutions.   HPI 60 yo pt of NP Carlean Purl presents for f/u of seasonal allergies/asthma /chest tightness   Wt Readings from Last 3 Encounters:  05/26/21 161 lb (73 kg)  10/21/20 159 lb 1 oz (72.2 kg)  02/26/20 163 lb 4.8 oz (74.1 kg)   29.45 kg/m  More tightness in chest since covid in august  Her covid case caused congestion and sinus symptoms and fatigue (no chest symptoms and no fever)  Thinks allergies got worse since then   More spells of sneezing   In the past-bad allergies   More severe with cats   Now has a dog (since march)  She is a little allergic to that  Ordinarily walks dog 1-2 mi per day  Also under a lot of stress (brother had cancer) and she drove to [place] quite often Also stress at work   Gets tightness in chest occ Also some episodes of faster HR (not exertional) Had an episode with driving- her HR went up to 123 (anxious) That may be from the prednisone or albuterol?  Also occ spells of light headedness    Is a smoker  1/2 ppd at [employer] 5-6 cig per day Went a week with 1 cig per day when she was with her family  Hard to continue with that when very stressed    She had a virtual visit with NP Carlis Abbott on 11/22 She was px prednisone 40 mg daily for 5 days as well as prednisone  It did help a little  Using albuterol at night   No wheezing  No coughing  Has never had PFTs   Pulse ox is 98% today   Now -no sneezing or congestion   Used some zyrtec prior  Now for 2 weeks-better   Brother has copd   Was prev on effexor for depression- it was hard to come off of   Did counseling in the past That was much worse-terrible divorce  EKG today NSR with rate of 63 Nl PR and QT intervals  Nl axis  No ST or T wave changes   Patient Active Problem List   Diagnosis Date Noted   Feeling of chest tightness 05/26/2021   Stress reaction 05/26/2021   Environmental and seasonal allergies 05/12/2021   Mild intermittent asthma without complication 72/53/6644   Hyperlipidemia 10/21/2020   Routine general medical examination at a health care facility 10/13/2020   Vitamin D deficiency 10/13/2020   Left ankle injury 02/26/2020   Fracture of distal fibula 02/26/2020   Chest wall pain 01/18/2020   Paresthesia of right leg 01/18/2020   Smoker 01/18/2020   Irritable bowel syndrome with constipation 04/05/2019   Class 1 obesity due to excess calories without serious comorbidity with body mass index (BMI) of 30.0 to 30.9 in adult 04/05/2019   Chronic pain of right knee 04/05/2019   Past Medical History:  Diagnosis Date   Breast cancer ([place])    Depression    Hyperlipemia    IBS (irritable bowel syndrome)    Migraines    Personal history of radiation therapy    Past Surgical History:  Procedure Laterality Date   BREAST BIOPSY Left 08/16/2012   positive    BREAST LUMPECTOMY Left 08/31/2012   BREAST LUMPECTOMY WITH NEEDLE LOCALIZATION AND AXILLARY SENTINEL LYMPH NODE BX Left 08/31/2012   Procedure: left BREAST needle localized  LUMPECTOMY WITH left sentinel lymph node mapping ;  Surgeon: Marcello Moores A. Cornett, MD;  Location: [facility];  Service: General;  Laterality: Left;  needle localization at breast center of [place] 7:30    BREAST SURGERY  08/31/12   [id number]   [place]  2010   COLONOSCOPY  04/24/2019   OOPHORECTOMY  Right 1999, Left 2001   POLYPECTOMY     Social History   Tobacco Use   Smoking status: Every Day    Packs/day: 0.25    Years: 30.00    Pack years: 7.50    Types: Cigarettes     Start date: 04/03/1989   Smokeless tobacco: Never   Tobacco comments:    4 cigarettes per day   Vaping Use   Vaping Use: Never used  Substance Use Topics   Alcohol use: Yes    Comment: rare   Drug use: No   Family History  Problem Relation Age of Onset   Breast cancer Mother 69   Arthritis Mother    Hypertension Mother    Cancer Father        Hodgkin Lymphoma   Other Father        "twisted colon"   Arthritis Sister    Hyperlipidemia Sister    Depression Brother    Colon cancer Neg Hx    Esophageal cancer Neg Hx    Stomach cancer Neg Hx    Rectal cancer Neg Hx    No Known Allergies Current Outpatient Medications on File Prior to Visit  Medication Sig Dispense Refill   cholecalciferol (VITAMIN D3) 25 MCG (1000 UNIT) tablet Take 1,000 Units by mouth daily. 2 tabs per day     Multiple Vitamins-Minerals (MULTIVITAMIN WITH MINERALS) tablet Take 1 tablet by mouth daily.     rosuvastatin (CRESTOR) 5 MG tablet Take 1 tablet (5 mg total) by mouth daily. 90 tablet 3   No current facility-administered medications on file prior to visit.    Review of Systems  Constitutional:  Positive for fatigue. Negative for activity change, appetite change, fever and unexpected weight change.  HENT:  Negative for congestion, ear pain, rhinorrhea, sinus pressure and sore throat.   Eyes:  Negative for pain, redness and visual disturbance.  Respiratory:  Positive for chest tightness. Negative for apnea, cough, choking, shortness of breath, wheezing and stridor.   Cardiovascular:  Negative for chest pain and palpitations.  Gastrointestinal:  Negative for abdominal pain, blood in stool, constipation, diarrhea and nausea.  Endocrine: Negative for polydipsia and polyuria.  Genitourinary:  Negative for dysuria, frequency and urgency.  Musculoskeletal:  Negative for arthralgias, back pain and myalgias.  Skin:  Negative for pallor and rash.  Allergic/Immunologic: Negative for environmental allergies.   Neurological:  Negative for dizziness, syncope and headaches.  Hematological:  Negative for adenopathy. Does not bruise/bleed easily.  Psychiatric/Behavioral:  Negative for decreased concentration and dysphoric mood. The patient is nervous/anxious.       Objective:   Physical Exam Constitutional:      General: She is not in acute distress.    Appearance: Normal appearance. She is well-developed. She is obese. She is not ill-appearing or diaphoretic.  HENT:     Head: Normocephalic  and atraumatic.     Mouth/Throat:     Mouth: Mucous membranes are moist.  Eyes:     General: No scleral icterus.    Conjunctiva/sclera: Conjunctivae normal.     Pupils: Pupils are equal, round, and reactive to light.  Neck:     Thyroid: No thyromegaly.     Vascular: No carotid bruit or JVD.  Cardiovascular:     Rate and Rhythm: Normal rate and regular rhythm.     Pulses: Normal pulses.     Heart sounds: Normal heart sounds. No murmur heard.   No gallop.  Pulmonary:     Effort: Pulmonary effort is normal. No respiratory distress.     Breath sounds: Normal breath sounds. No stridor. No wheezing, rhonchi or rales.     Comments: No wheeze even on forced expiration  Chest:     Chest wall: No tenderness.  Abdominal:     General: Bowel sounds are normal. There is no distension or abdominal bruit.     Palpations: Abdomen is soft. There is no mass.     Tenderness: There is no abdominal tenderness.  Musculoskeletal:     Cervical back: Normal range of motion and neck supple. No tenderness.     Right lower leg: No edema.     Left lower leg: No edema.  Lymphadenopathy:     Cervical: No cervical adenopathy.  Skin:    General: Skin is warm and dry.     Coloration: Skin is not jaundiced or pale.     Findings: No bruising, erythema or rash.  Neurological:     Mental Status: She is alert.     Coordination: Coordination normal.     Deep Tendon Reflexes: Reflexes are normal and symmetric. Reflexes normal.   Psychiatric:        Attention and Perception: Attention normal.        Mood and Affect: Mood is anxious.        Speech: Speech normal.        Behavior: Behavior normal.        Cognition and Memory: Cognition and memory normal.     Comments: Discusses stressors and symptoms candidly          Assessment & Plan:   Problem List Items Addressed This Visit       Respiratory   Mild intermittent asthma without complication    In a smoker  Reviewed last note from visit 11/22 She did have some improvement with prednisone  Finds albuterol handy-uses at night prn  Has cut down smoking and may be open to quitting later  Unsure if her feeling of chest tightness correlates to asthma cxr and EKG ordered      Relevant Medications   albuterol (VENTOLIN HFA) 108 (90 Base) MCG/ACT inhaler     Other   Smoker    Has cut to under 1/2 ppd  Working on it  Disc in detail risks of smoking and possible outcomes including copd, vascular/ heart disease, cancer , respiratory and sinus infections  Pt voices understanding       Relevant Orders   EKG 12-Lead (Completed)   DG Chest 2 View   Environmental and seasonal allergies    Improved now       Relevant Medications   albuterol (VENTOLIN HFA) 108 (90 Base) MCG/ACT inhaler   Feeling of chest tightness - Primary    Unsure if this is from asthma/breathing or possibly anxiety (stress reaction) Some improved now  EKG and  CXR ordered  May want to consider PFTs in the future in light of asthma /smoking history  Reassuring vitals and exam today        Relevant Orders   EKG 12-Lead (Completed)   DG Chest 2 View   Stress reaction    Stressors including sick family and work  Exp some feelings of rapid HR and also chest tightness and panic  Has been on prednisone-may inc anxiety symptoms Reviewed stressors/ coping techniques/symptoms/ support sources/ tx options and side effects in detail today She has had counseling and effexor in the past  -does not feel like she needs either quite yet Discussed imp of self care -especially moderate exercise

## 2021-05-27 ENCOUNTER — Telehealth: Payer: Self-pay | Admitting: *Deleted

## 2021-05-27 NOTE — Telephone Encounter (Signed)
-----   Message from Abner Greenspan, MD sent at 05/26/2021  5:53 PM EST ----- Your chest xray is reassuring -no signs of lung disease  (ekg was good also)  There is some evidence of cholesterol build up in the aorta, while I do not think this causes any of your symptoms, it bears watching and makes it especially important to consider smoking cessation and treat cholesterol Is she still taking the crestor?  I want to get a fasting lipid profile to make sure it is working  If her chest tightness continues or she wants to consider treatment for anxiety please let me know

## 2021-05-27 NOTE — Telephone Encounter (Signed)
Left VM requesting pt to call the office back 

## 2021-07-22 ENCOUNTER — Ambulatory Visit: Payer: No Typology Code available for payment source | Admitting: Family Medicine

## 2021-07-22 ENCOUNTER — Encounter: Payer: Self-pay | Admitting: Family Medicine

## 2021-07-22 ENCOUNTER — Other Ambulatory Visit: Payer: Self-pay

## 2021-07-22 ENCOUNTER — Telehealth: Payer: Self-pay | Admitting: Family Medicine

## 2021-07-22 DIAGNOSIS — M7072 Other bursitis of hip, left hip: Secondary | ICD-10-CM

## 2021-07-22 DIAGNOSIS — R0789 Other chest pain: Secondary | ICD-10-CM

## 2021-07-22 MED ORDER — MELOXICAM 15 MG PO TABS: 15.0000 mg | ORAL_TABLET | Freq: Every day | ORAL | 0 refills | Status: DC | PRN

## 2021-07-22 NOTE — Patient Instructions (Addendum)
Instead of ibuprofen try the meloxicam daily as needed with food   Use ice to painful area for 10 minutes when you can  Avoid painful activities when you can   Take a look at the handouts   Make an appointment with Dr Lorelei Pont   Please alert [place] if something changes

## 2021-07-22 NOTE — Assessment & Plan Note (Signed)
Improved , uses inhaler infrequently

## 2021-07-22 NOTE — Progress Notes (Signed)
Subjective:    Patient ID: Destiny Harrell, female    DOB: [birth year], 61 y.o.   MRN: [id number]  This visit occurred during the SARS-CoV-2 public health emergency.  Safety protocols were in place, including screening questions prior to the visit, additional usage of staff PPE, and extensive cleaning of exam room while observing appropriate contact time as indicated for disinfecting solutions.   HPI Pt presents with L leg pain   Wt Readings from Last 3 Encounters:  07/22/21 163 lb 6 oz (74.1 kg)  05/26/21 161 lb (73 kg)  10/21/20 159 lb 1 oz (72.2 kg)   29.88 kg/m  Started about a month ago after sleeping on couch for several days  Pain in the outer L hip area   Took hot baths Stretching helped some   Last week it started to feel like nerve pain  Hurts to bear weight on that leg  Has not been able to walk her dog  Hobbles around  Now a twinge in the lower back/like sciatica    Pain does not shoot down leg   Normal range of movement   (may hurt a bit with external rotation)   Tried ice  Taking ibuprofen  - 4 pills am and pm (helps a little)  Tried a foam roller    Ok at night unless she moves the wrong way  Some discomfort to lay on that side or put pressure on it  There is a tender spot when she pushes    No rash or skin change   Patient Active Problem List   Diagnosis Date Noted   Hip bursitis, left 07/22/2021   Feeling of chest tightness 05/26/2021   Stress reaction 05/26/2021   Environmental and seasonal allergies 05/12/2021   Mild intermittent asthma without complication 67/34/1937   Hyperlipidemia 10/21/2020   Routine general medical examination at a health care facility 10/13/2020   Vitamin D deficiency 10/13/2020   Left ankle injury 02/26/2020   Fracture of distal fibula 02/26/2020   Chest wall pain 01/18/2020   Paresthesia of right leg 01/18/2020   Smoker 01/18/2020   Irritable bowel syndrome with constipation 04/05/2019   Class 1 obesity due to  excess calories without serious comorbidity with body mass index (BMI) of 30.0 to 30.9 in adult 04/05/2019   Chronic pain of right knee 04/05/2019   Past Medical History:  Diagnosis Date   Breast cancer ([place])    Depression    Hyperlipemia    IBS (irritable bowel syndrome)    Migraines    Personal history of radiation therapy    Past Surgical History:  Procedure Laterality Date   BREAST BIOPSY Left 08/16/2012   positive    BREAST LUMPECTOMY Left 08/31/2012   BREAST LUMPECTOMY WITH NEEDLE LOCALIZATION AND AXILLARY SENTINEL LYMPH NODE BX Left 08/31/2012   Procedure: left BREAST needle localized  LUMPECTOMY WITH left sentinel lymph node mapping ;  Surgeon: Marcello Moores A. Cornett, MD;  Location: [facility];  Service: General;  Laterality: Left;  needle localization at breast center of [place] 7:30    BREAST SURGERY  08/31/12   [id number]-HER-[id number]-   [place]  2010   COLONOSCOPY  04/24/2019   OOPHORECTOMY  Right 1999, Left 2001   POLYPECTOMY     Social History   Tobacco Use   Smoking status: Every Day    Packs/day: 0.25    Years: 30.00    Pack years: 7.50  Types: Cigarettes    Start date: 04/03/1989   Smokeless tobacco: Never   Tobacco comments:    4 cigarettes per day   Vaping Use   Vaping Use: Never used  Substance Use Topics   Alcohol use: Yes    Comment: rare   Drug use: No   Family History  Problem Relation Age of Onset   Breast cancer Mother 33   Arthritis Mother    Hypertension Mother    Cancer Father        Hodgkin Lymphoma   Other Father        "twisted colon"   Arthritis Sister    Hyperlipidemia Sister    Depression Brother    Colon cancer Neg Hx    Esophageal cancer Neg Hx    Stomach cancer Neg Hx    Rectal cancer Neg Hx    No Known Allergies Current Outpatient Medications on File Prior to Visit  Medication Sig Dispense Refill   albuterol (VENTOLIN HFA) 108 (90 Base) MCG/ACT inhaler Inhale 2 puffs  into the lungs every 4 (four) hours as needed for shortness of breath. 1 each 3   cholecalciferol (VITAMIN D3) 25 MCG (1000 UNIT) tablet Take 1,000 Units by mouth daily. 2 tabs per day     Multiple Vitamins-Minerals (MULTIVITAMIN WITH MINERALS) tablet Take 1 tablet by mouth daily.     rosuvastatin (CRESTOR) 5 MG tablet Take 1 tablet (5 mg total) by mouth daily. 90 tablet 3   No current facility-administered medications on file prior to visit.     Review of Systems  Constitutional:  Negative for activity change, appetite change, fatigue, fever and unexpected weight change.  HENT:  Negative for congestion, ear pain, rhinorrhea, sinus pressure and sore throat.   Eyes:  Negative for pain, redness and visual disturbance.  Respiratory:  Negative for cough, shortness of breath and wheezing.   Cardiovascular:  Negative for chest pain and palpitations.  Gastrointestinal:  Negative for abdominal pain, blood in stool, constipation and diarrhea.  Endocrine: Negative for polydipsia and polyuria.  Genitourinary:  Negative for dysuria, frequency and urgency.  Musculoskeletal:  Negative for arthralgias, back pain and myalgias.       Pain in L hip area  No swelling  Skin:  Negative for pallor and rash.  Allergic/Immunologic: Negative for environmental allergies.  Neurological:  Negative for dizziness, syncope, weakness, numbness and headaches.  Hematological:  Negative for adenopathy. Does not bruise/bleed easily.  Psychiatric/Behavioral:  Negative for decreased concentration and dysphoric mood. The patient is not nervous/anxious.       Objective:   Physical Exam Constitutional:      General: She is not in acute distress.    Appearance: Normal appearance. She is normal weight. She is not ill-appearing.  Eyes:     General: No scleral icterus.    Conjunctiva/sclera: Conjunctivae normal.     Pupils: Pupils are equal, round, and reactive to light.  Cardiovascular:     Rate and Rhythm: Normal rate and  regular rhythm.     Heart sounds: Normal heart sounds.  Pulmonary:     Effort: Pulmonary effort is normal. No respiratory distress.     Breath sounds: Normal breath sounds.  Musculoskeletal:     Cervical back: Normal range of motion and neck supple.     Comments: Gait favors R leg , pain to put weight on left side  Some tenderness over L greater trochanter  No crepitus and no groin tenderness Pain with full ext rot  of L hip (worse with flexion)  Otherwise nl rom of hip  No LS or SI tenderness   No skin changes  No focal weakness   Lymphadenopathy:     Cervical: No cervical adenopathy.  Skin:    Findings: No erythema, lesion or rash.  Neurological:     Mental Status: She is alert.     Sensory: No sensory deficit.     Motor: No weakness.     Coordination: Coordination normal.     Deep Tendon Reflexes: Reflexes normal.  Psychiatric:        Mood and Affect: Mood normal.          Assessment & Plan:   Problem List Items Addressed This Visit       Musculoskeletal and Integument   Hip bursitis, left    Suspect trochanteric bursitis  Encouraged ice and relative rest when able Px meloxicam 15 mg qd prn instead of ibuprofen  Given handout re: hip bursitis Also some rehabs stretch/exercise to try as she improves Ref made to sport med  May consider injection if this is the correct dx inst to call if symptoms suddenly worsen or change in the meantime

## 2021-07-22 NOTE — Telephone Encounter (Signed)
Mrs. Destiny Harrell wanted to know if Dr. Glori Bickers would take her on as a patient.

## 2021-07-22 NOTE — Telephone Encounter (Signed)
That is ok. Please go ahead and put me on as pcp.  I have seen her numerous times. She should know that when we get back I will be busier and harder to get an appt with (but that may be the case with everybody)

## 2021-07-22 NOTE — Assessment & Plan Note (Signed)
Suspect trochanteric bursitis  Encouraged ice and relative rest when able Px meloxicam 15 mg qd prn instead of ibuprofen  Given handout re: hip bursitis Also some rehabs stretch/exercise to try as she improves Ref made to sport med  May consider injection if this is the correct dx inst to call if symptoms suddenly worsen or change in the meantime

## 2021-07-26 NOTE — Progress Notes (Signed)
Destiny Harrell T. Destiny Weinrich, MD, [facility] [place], [address]  Phone: [phone]   FAX: [phone]  Destiny Harrell - 61 y.o. female   MRN [id number]   Date of Birth: [birth year]  Date: 07/27/2021   PCP: Abner Greenspan, MD   Referral: Elby Beck, FNP  Chief Complaint  Patient presents with   Hip Pain    Left    This visit occurred during the SARS-CoV-2 public health emergency.  Safety protocols were in place, including screening questions prior to the visit, additional usage of staff PPE, and extensive cleaning of exam room while observing appropriate contact time as indicated for disinfecting solutions.   Subjective:   Destiny Harrell is a 61 y.o.  very pleasant female patient with Body mass index is 29.9 kg/m. who presents with the following:  Patient is seen courtesy of Dr. Glori Bickers for presumed trochanteric bursitis of the left hip.  She is currently taking some meloxicam, and she did take some ibuprofen previously.  Started around New Years weekend.  Will urt when she lies down on a hard place.  The week before.  Was having some issue even walking her dog.    Mobic has helped some.   She is normally very active, can exercise, and likes to hike.  Right now, she is pretty limited, and pain is focal to the lateral hip.  Review of Systems is noted in the HPI, as appropriate   Objective:   BP 100/70    Pulse 78    Temp 98.4 F (36.9 C) (Temporal)    Ht 5\' 2"  (1.575 m)    Wt 163 lb 8 oz (74.2 kg)    LMP 06/22/1999 (Approximate)    SpO2 96%    BMI 29.90 kg/m    HIP EXAM: SIDE: L ROM: Abduction, Flexion, Internal and External range of motion: full Pain with terminal IROM and EROM: minimal to none GTB: L >> R SLR: NEG Knees: No effusion FABER: NT REVERSE FABER: NT, neg Piriformis: NT at direct palpation Str: flexion: 4/5, some pain abduction: 5/5 adduction: 5/5  Full ROM and Str  throughout LE Back normal ROM Neurovasc intact.   Radiology: No results found.  Assessment and Plan:     ICD-10-CM   1. Trochanteric bursitis of left hip  M70.62 triamcinolone acetonide (KENALOG-40) injection 40 mg    2. Acute hip pain, left  M25.552 triamcinolone acetonide (KENALOG-40) injection 40 mg     Classic history and exam, no concern for intra articular pathology  I reviewed with the patient the structures involved and how they related to diagnosis.   Patient was given a rehabilitation protocol emphasizing core rehab, partcularly addressing abductor weakness, and stretching of the pelvic musculature, hips, and ITB.  Patient Instructions  Hip Rehab:  Hip Flexion: Toe up to ceiling, laying on your back. Lift your whole leg, 3 sets. Work up to being able to do #30 with each set.  Hip elevations, Toe and leg turned out to side.  Lift whole leg, 3 sets. Work up to being able to do #30 with each set.  Hip Abductions: Lying on side, straight out to side. 3 sets, work up to being able to do #30 with each set.  At the beginning you may only be able to do a lot less, try to do #10.    Aspiration/Injection Procedure Note Destiny Harrell 04-Oct-1960 Date of procedure: 07/27/2021  Procedure: Large Joint Aspiration / Injection of Hip, Trochanteric Bursa, L Indications: Pain  Procedure Details Verbal consent obtained. Risks, benefits, and alternatives reviewed. Greater trochanter sterilely prepped with Chloraprep. Ethyl Chloride used for anesthesia. 9 cc of Lidocaine 1% injected with 1 mL of Kenalog 40 mg into trochanteric bursa at area of maximal tenderness at greater trochanter. Needle taken to bone to troch bursa, flows easily. Bursa massaged. No bleeding and no complications. Decreased pain after injection. Needle: 22 gauge spinal needle Medication: 1 mL of Kenalog 40 mg   Meds ordered this encounter  Medications   triamcinolone acetonide (KENALOG-40) injection 40 mg    There are no discontinued medications. No orders of the defined types were placed in this encounter.   Follow-up: prn  [employer] speech-to-text software was used for transcription in this dictation.  Possible transcriptional errors can occur using Editor, commissioning.   Signed,  Destiny Deed. Bohdi Leeds, MD   Outpatient Encounter Medications as of 07/27/2021  Medication Sig   albuterol (VENTOLIN HFA) 108 (90 Base) MCG/ACT inhaler Inhale 2 puffs into the lungs every 4 (four) hours as needed for shortness of breath.   cholecalciferol (VITAMIN D3) 25 MCG (1000 UNIT) tablet Take 1,000 Units by mouth daily. 2 tabs per day   meloxicam (MOBIC) 15 MG tablet Take 1 tablet (15 mg total) by mouth daily as needed for pain. With food   Multiple Vitamins-Minerals (MULTIVITAMIN WITH MINERALS) tablet Take 1 tablet by mouth daily.   rosuvastatin (CRESTOR) 5 MG tablet Take 1 tablet (5 mg total) by mouth daily.   [EXPIRED] triamcinolone acetonide (KENALOG-40) injection 40 mg    No facility-administered encounter medications on file as of 07/27/2021.

## 2021-07-27 ENCOUNTER — Ambulatory Visit: Payer: No Typology Code available for payment source | Admitting: Family Medicine

## 2021-07-27 ENCOUNTER — Encounter: Payer: Self-pay | Admitting: Family Medicine

## 2021-07-27 ENCOUNTER — Other Ambulatory Visit: Payer: Self-pay

## 2021-07-27 VITALS — BP 100/70 | HR 78 | Temp 98.4°F | Ht 62.0 in | Wt 163.5 lb

## 2021-07-27 DIAGNOSIS — M25552 Pain in left hip: Secondary | ICD-10-CM | POA: Diagnosis not present

## 2021-07-27 DIAGNOSIS — M7062 Trochanteric bursitis, left hip: Secondary | ICD-10-CM | POA: Diagnosis not present

## 2021-07-27 MED ORDER — TRIAMCINOLONE ACETONIDE 40 MG/ML IJ SUSP
40.0000 mg | Freq: Once | INTRAMUSCULAR | Status: AC
  Administered 2021-07-27: 40 mg via INTRA_ARTICULAR

## 2021-07-27 NOTE — Patient Instructions (Signed)
Hip Rehab:  Hip Flexion: Toe up to ceiling, laying on your back. Lift your whole leg, 3 sets. Work up to being able to do #30 with each set.  Hip elevations, Toe and leg turned out to side.  Lift whole leg, 3 sets. Work up to being able to do #30 with each set.  Hip Abductions: Lying on side, straight out to side. 3 sets, work up to being able to do #30 with each set.  At the beginning you may only be able to do a lot less, try to do #10.  

## 2021-08-20 ENCOUNTER — Other Ambulatory Visit: Payer: Self-pay | Admitting: Family Medicine

## 2021-08-21 MED ORDER — MELOXICAM 15 MG PO TABS: 15.0000 mg | ORAL_TABLET | Freq: Every day | ORAL | 0 refills | Status: DC | PRN

## 2021-08-21 NOTE — Telephone Encounter (Signed)
Med was given at appt on 07/22/21 for leg issues/pain. #30 tabs with 0 refills ?

## 2021-09-21 ENCOUNTER — Other Ambulatory Visit: Payer: Self-pay | Admitting: Family Medicine

## 2021-09-21 NOTE — Telephone Encounter (Signed)
Is this okay to refill ? ? ?Last filled 08/21/21 ?Last OV 07/22/21  ?Next OV NO  ?

## 2021-10-26 ENCOUNTER — Encounter: Payer: Self-pay | Admitting: Family Medicine

## 2021-10-26 ENCOUNTER — Ambulatory Visit (INDEPENDENT_AMBULATORY_CARE_PROVIDER_SITE_OTHER)
Admission: RE | Admit: 2021-10-26 | Discharge: 2021-10-26 | Disposition: A | Discharge status: Home / Self Care | Payer: No Typology Code available for payment source | Source: Ambulatory Visit | Attending: Family Medicine | Admitting: Family Medicine

## 2021-10-26 ENCOUNTER — Ambulatory Visit: Payer: No Typology Code available for payment source | Admitting: Family Medicine

## 2021-10-26 VITALS — BP 114/70 | HR 71 | Ht 62.0 in | Wt 164.4 lb

## 2021-10-26 DIAGNOSIS — M7072 Other bursitis of hip, left hip: Secondary | ICD-10-CM

## 2021-10-26 DIAGNOSIS — M25552 Pain in left hip: Secondary | ICD-10-CM | POA: Insufficient documentation

## 2021-10-26 NOTE — Patient Instructions (Addendum)
Keep icing the hip area  ? ?Let's check an xray today (hip)  ?We will reach out when it gets read   ? ?I will likely recommend PT once that is read  ? ?Do the stretches that don't hurt  ? ? ? ?

## 2021-10-26 NOTE — Assessment & Plan Note (Signed)
Ongoing symptoms /improved but not resolved  ?Much initial improvement after an injection  ?Now pain at [facility] to lie on it  ? ?Adv continued stretching and ice and avoid painful activities  ?In light of length of symptoms-hip xray ordered  ?No help with nsaids  ?Pending rad review-will likely refer to PT (no pref re area)  ?In future unsure if prednisone would help (since nsaid did not) or topical voltaren ?

## 2021-10-26 NOTE — Progress Notes (Signed)
? ?Subjective:  ? ? Patient ID: Destiny Harrell, female    DOB: 1960/08/08, 61 y.o.   MRN: [id number] ? ?HPI ?Pt presents with leg pain  ? ?Wt Readings from Last 3 Encounters:  ?10/26/21 164 lb 6.4 oz (74.6 kg)  ?07/27/21 163 lb 8 oz (74.2 kg)  ?07/22/21 163 lb 6 oz (74.1 kg)  ? ?30.07 kg/m? ? ? ?She was seen in feb ?Dx with torch bursitis L hip , px meloxicam and ice  ?Did not improve ?Had appt with Dr Lorelei Pont  ? ?Was px rehab protocol for it  ?Injected the bursa as well with triamcinolone    (that really did help) and she started walking more  ?Harder to walk hills/ holding back  ? ?Has never been 100% since it started  ?The sharp pain is better  ?Can walk but nervous to do an aggressive hike  ? ?Now aching at night /if she lay on the L side (or sit)  ?In lateral upper leg/hip and also sacrum area  ? ?Dull ache/can be intense  ? ?Meloxicam used to help but not really now  ?Tried ibuprofen -not helpful either  ? ?Has used ice -it helps form the moment but does not persist  ? ?Would be open to PT  ? ?Patient Active Problem List  ? Diagnosis Date Noted  ? Left hip pain 10/26/2021  ? Hip bursitis, left 07/22/2021  ? Feeling of chest tightness 05/26/2021  ? Stress reaction 05/26/2021  ? Environmental and seasonal allergies 05/12/2021  ? Mild intermittent asthma without complication 83/15/1761  ? Hyperlipidemia 10/21/2020  ? Routine general medical examination at a health care facility 10/13/2020  ? Vitamin D deficiency 10/13/2020  ? Left ankle injury 02/26/2020  ? Fracture of distal fibula 02/26/2020  ? Chest wall pain 01/18/2020  ? Paresthesia of right leg 01/18/2020  ? Smoker 01/18/2020  ? Irritable bowel syndrome with constipation 04/05/2019  ? Class 1 obesity due to excess calories without serious comorbidity with body mass index (BMI) of 30.0 to 30.9 in adult 04/05/2019  ? Chronic pain of right knee 04/05/2019  ? ?Past Medical History:  ?Diagnosis Date  ? Breast cancer ([place])   ? Depression   ? Hyperlipemia   ? IBS  (irritable bowel syndrome)   ? Migraines   ? Personal history of radiation therapy   ? ?Past Surgical History:  ?Procedure Laterality Date  ? BREAST BIOPSY Left 08/16/2012  ? positive   ? BREAST LUMPECTOMY Left 08/31/2012  ? BREAST LUMPECTOMY WITH NEEDLE LOCALIZATION AND AXILLARY SENTINEL LYMPH NODE BX Left 08/31/2012  ? Procedure: left BREAST needle localized  LUMPECTOMY WITH left sentinel lymph node mapping ;  Surgeon: Joyice Faster. Cornett, MD;  Location: [facility];  Service: General;  Laterality: Left;  needle localization at breast center of [place] 7:30 ?  ? BREAST SURGERY  08/31/12  ? [id number]-HER-2neu-  ? [place]  ? CHOLECYSTECTOMY  2010  ? COLONOSCOPY  04/24/2019  ? OOPHORECTOMY  Right 1999, Left 2001  ? POLYPECTOMY    ? ?Social History  ? ?Tobacco Use  ? Smoking status: Every Day  ?  Packs/day: 0.25  ?  Years: 30.00  ?  Pack years: 7.50  ?  Types: Cigarettes  ?  Start date: 04/03/1989  ? Smokeless tobacco: Never  ? Tobacco comments:  ?  4 cigarettes per day   ?Vaping Use  ? Vaping Use: Never used  ?Substance Use Topics  ?  Alcohol use: Yes  ?  Comment: rare  ? Drug use: No  ? ?Family History  ?Problem Relation Age of Onset  ? Breast cancer Mother 47  ? Arthritis Mother   ? Hypertension Mother   ? Cancer Father   ?     Hodgkin Lymphoma  ? Other Father   ?     "twisted colon"  ? Arthritis Sister   ? Hyperlipidemia Sister   ? Depression Brother   ? Colon cancer Neg Hx   ? Esophageal cancer Neg Hx   ? Stomach cancer Neg Hx   ? Rectal cancer Neg Hx   ? ?No Known Allergies ?Current Outpatient Medications on File Prior to Visit  ?Medication Sig Dispense Refill  ? albuterol (VENTOLIN HFA) 108 (90 Base) MCG/ACT inhaler Inhale 2 puffs into the lungs every 4 (four) hours as needed for shortness of breath. 1 each 3  ? cholecalciferol (VITAMIN D3) 25 MCG (1000 UNIT) tablet Take 1,000 Units by mouth daily. 2 tabs per day    ? meloxicam (MOBIC) 15 MG tablet TAKE 1 TABLET (15 MG TOTAL) BY  MOUTH DAILY AS NEEDED FOR PAIN. WITH FOOD 30 tablet 3  ? Multiple Vitamins-Minerals (MULTIVITAMIN WITH MINERALS) tablet Take 1 tablet by mouth daily.    ? rosuvastatin (CRESTOR) 5 MG tablet Take 1 tablet (5 mg total) by mouth daily. 90 tablet 3  ? ?No current facility-administered medications on file prior to visit.  ?  ?Review of Systems  ?Constitutional:  Negative for activity change, appetite change, fatigue, fever and unexpected weight change.  ?HENT:  Negative for congestion, ear pain, rhinorrhea, sinus pressure and sore throat.   ?Eyes:  Negative for pain, redness and visual disturbance.  ?Respiratory:  Negative for cough, shortness of breath and wheezing.   ?Cardiovascular:  Negative for chest pain and palpitations.  ?Gastrointestinal:  Negative for abdominal pain, blood in stool, constipation and diarrhea.  ?Endocrine: Negative for polydipsia and polyuria.  ?Genitourinary:  Negative for dysuria, frequency and urgency.  ?Musculoskeletal:  Negative for arthralgias, back pain and myalgias.  ?     Left lateral hip pain   ?Skin:  Negative for pallor and rash.  ?Allergic/Immunologic: Negative for environmental allergies.  ?Neurological:  Negative for dizziness, syncope and headaches.  ?Hematological:  Negative for adenopathy. Does not bruise/bleed easily.  ?Psychiatric/Behavioral:  Negative for decreased concentration and dysphoric mood. The patient is not nervous/anxious.   ? ?   ?Objective:  ? Physical Exam ?Constitutional:   ?   General: She is not in acute distress. ?   Appearance: Normal appearance. She is obese. She is not ill-appearing.  ?Eyes:  ?   Pupils: Pupils are equal, round, and reactive to light.  ?Cardiovascular:  ?   Rate and Rhythm: Normal rate and regular rhythm.  ?   Heart sounds: Normal heart sounds.  ?Pulmonary:  ?   Effort: Pulmonary effort is normal. No respiratory distress.  ?   Breath sounds: Normal breath sounds.  ?Musculoskeletal:  ?   Comments: Nl rom of spine  ? ?Some tenderness of L  greater trochanter area  ?No swelling or skin change  ?Nl rom of LS and hips ?Some mild tenderness of lower LS  ? ?Nl gait   ?Skin: ?   General: Skin is warm and dry.  ?   Coloration: Skin is not pale.  ?   Findings: No bruising, erythema or rash.  ?Neurological:  ?   Mental Status: She is alert.  ?  Sensory: No sensory deficit.  ?   Motor: No weakness.  ?   Coordination: Coordination normal.  ?   Deep Tendon Reflexes: Reflexes normal.  ?Psychiatric:     ?   Mood and Affect: Mood normal.  ? ? ? ? ? ?   ?Assessment & Plan:  ? ?Problem List Items Addressed This Visit   ? ?  ? Musculoskeletal and Integument  ? Hip bursitis, left - Primary  ?  Ongoing symptoms /improved but not resolved  ?Much initial improvement after an injection  ?Now pain at [facility] to lie on it  ? ?Adv continued stretching and ice and avoid painful activities  ?In light of length of symptoms-hip xray ordered  ?No help with nsaids  ?Pending rad review-will likely refer to PT (no pref re area)  ?In future unsure if prednisone would help (since nsaid did not) or topical voltaren ? ?  ?  ? Relevant Orders  ? DG Hip Unilat W OR W/O Pelvis 2-3 Views Left  ?  ? Other  ? Left hip pain  ? Relevant Orders  ? DG Hip Unilat W OR W/O Pelvis 2-3 Views Left  ? ? ? ?

## 2021-10-29 ENCOUNTER — Telehealth: Payer: Self-pay | Admitting: Family Medicine

## 2021-10-29 NOTE — Telephone Encounter (Signed)
I put a referral in  ?Please call us if you don't hear in a week ?

## 2021-10-29 NOTE — Telephone Encounter (Signed)
Pt called and said that Dr Glori Bickers had sent a message on whether she preferred [facility] or [facility] for a referral. I couldn't see the message to add this not to so I had to send separate. Patient said prefers [facility] ?

## 2021-10-29 NOTE — Addendum Note (Signed)
Addended by: Loura Pardon A on: 10/29/2021 12:04 PM ? ? Modules accepted: Orders ? ?

## 2021-11-01 ENCOUNTER — Telehealth: Payer: Self-pay | Admitting: Family Medicine

## 2021-11-01 DIAGNOSIS — Z0000 Encounter for general adult medical examination without abnormal findings: Secondary | ICD-10-CM

## 2021-11-01 DIAGNOSIS — E559 Vitamin D deficiency, unspecified: Secondary | ICD-10-CM

## 2021-11-01 DIAGNOSIS — E7800 Pure hypercholesterolemia, unspecified: Secondary | ICD-10-CM

## 2021-11-01 NOTE — Telephone Encounter (Signed)
-----   Message from Ellamae Sia sent at 10/27/2021 12:22 PM EDT ----- ?Regarding: Lab orders for Monday, 5.15.23 ?Patient is scheduled for CPX labs, please order future labs, Thanks , Terri ? ? ?

## 2021-11-02 ENCOUNTER — Other Ambulatory Visit (INDEPENDENT_AMBULATORY_CARE_PROVIDER_SITE_OTHER): Payer: No Typology Code available for payment source

## 2021-11-02 ENCOUNTER — Other Ambulatory Visit: Payer: Self-pay | Admitting: Family Medicine

## 2021-11-02 DIAGNOSIS — Z0000 Encounter for general adult medical examination without abnormal findings: Secondary | ICD-10-CM

## 2021-11-02 DIAGNOSIS — E7800 Pure hypercholesterolemia, unspecified: Secondary | ICD-10-CM | POA: Diagnosis not present

## 2021-11-02 DIAGNOSIS — E559 Vitamin D deficiency, unspecified: Secondary | ICD-10-CM

## 2021-11-02 LAB — COMPREHENSIVE METABOLIC PANEL
ALT: 22 U/L (ref 0–35)
AST: 23 U/L (ref 0–37)
Albumin: 4.1 g/dL (ref 3.5–5.2)
Alkaline Phosphatase: 46 U/L (ref 39–117)
BUN: 14 mg/dL (ref 6–23)
CO2: 25 mEq/L (ref 19–32)
Calcium: 9.3 mg/dL (ref 8.4–10.5)
Chloride: 109 mEq/L (ref 96–112)
Creatinine, Ser: 0.72 mg/dL (ref 0.40–1.20)
GFR: 91.18 mL/min (ref >60.00)
Glucose, Bld: 91 mg/dL (ref 70–99)
Potassium: 5.2 mEq/L — ABNORMAL HIGH (ref 3.5–5.1)
Sodium: 140 mEq/L (ref 135–145)
Total Bilirubin: 0.5 mg/dL (ref 0.2–1.2)
Total Protein: 6.5 g/dL (ref 6.0–8.3)

## 2021-11-02 LAB — CBC WITH DIFFERENTIAL/PLATELET
Basophils Absolute: 0.1 10*3/uL (ref 0.0–0.1)
Basophils Relative: 1.1 % (ref 0.0–3.0)
Eosinophils Absolute: 0.2 10*3/uL (ref 0.0–0.7)
Eosinophils Relative: 2.6 % (ref 0.0–5.0)
HCT: 40.7 % (ref 36.0–46.0)
Hemoglobin: 14 g/dL (ref 12.0–15.0)
Lymphocytes Relative: 30.4 % (ref 12.0–46.0)
Lymphs Abs: 1.9 10*3/uL (ref 0.7–4.0)
MCHC: 34.5 g/dL (ref 30.0–36.0)
MCV: 94.2 fl (ref 78.0–100.0)
Monocytes Absolute: 0.5 10*3/uL (ref 0.1–1.0)
Monocytes Relative: 7.6 % (ref 3.0–12.0)
Neutro Abs: 3.6 10*3/uL (ref 1.4–7.7)
Neutrophils Relative %: 58.3 % (ref 43.0–77.0)
Platelets: 352 10*3/uL (ref 150.0–400.0)
RBC: 4.32 Mil/uL (ref 3.87–5.11)
RDW: 12.9 % (ref 11.5–15.5)
WBC: 6.1 10*3/uL (ref 4.0–10.5)

## 2021-11-02 LAB — LIPID PANEL
Cholesterol: 169 mg/dL (ref 0–200)
HDL: 56.6 mg/dL (ref >39.00)
LDL Cholesterol: 99 mg/dL (ref 0–99)
NonHDL: 112.58
Total CHOL/HDL Ratio: 3
Triglycerides: 69 mg/dL (ref 0.0–149.0)
VLDL: 13.8 mg/dL (ref 0.0–40.0)

## 2021-11-02 LAB — VITAMIN D 25 HYDROXY (VIT D DEFICIENCY, FRACTURES): VITD: 34.85 ng/mL (ref 30.00–100.00)

## 2021-11-02 LAB — TSH: TSH: 2.12 u[IU]/mL (ref 0.35–5.50)

## 2021-11-09 ENCOUNTER — Ambulatory Visit: Payer: PRIVATE HEALTH INSURANCE | Attending: Family Medicine

## 2021-11-09 ENCOUNTER — Ambulatory Visit (INDEPENDENT_AMBULATORY_CARE_PROVIDER_SITE_OTHER): Payer: No Typology Code available for payment source | Admitting: Family Medicine

## 2021-11-09 ENCOUNTER — Encounter: Payer: Self-pay | Admitting: Family Medicine

## 2021-11-09 VITALS — BP 126/74 | HR 77 | Temp 97.2°F | Ht 62.25 in | Wt 159.4 lb

## 2021-11-09 DIAGNOSIS — Z0000 Encounter for general adult medical examination without abnormal findings: Secondary | ICD-10-CM

## 2021-11-09 DIAGNOSIS — R262 Difficulty in walking, not elsewhere classified: Secondary | ICD-10-CM | POA: Insufficient documentation

## 2021-11-09 DIAGNOSIS — E2839 Other primary ovarian failure: Secondary | ICD-10-CM

## 2021-11-09 DIAGNOSIS — M25552 Pain in left hip: Secondary | ICD-10-CM | POA: Insufficient documentation

## 2021-11-09 DIAGNOSIS — M7072 Other bursitis of hip, left hip: Secondary | ICD-10-CM

## 2021-11-09 DIAGNOSIS — E7800 Pure hypercholesterolemia, unspecified: Secondary | ICD-10-CM

## 2021-11-09 DIAGNOSIS — E559 Vitamin D deficiency, unspecified: Secondary | ICD-10-CM

## 2021-11-09 DIAGNOSIS — F17200 Nicotine dependence, unspecified, uncomplicated: Secondary | ICD-10-CM | POA: Diagnosis not present

## 2021-11-09 NOTE — Therapy (Signed)
[place] MAIN [facility] SERVICES [address] [place], [place], [address] Phone: [phone]   Fax:  [phone]  Physical Therapy Evaluation  Patient Details  Name: Signa Cheek MRN: [id number] Date of Birth: 1960/08/30 Referring Provider (PT): Dr. Loura Pardon   Encounter Date: 11/09/2021   PT End of Session - 11/09/21 1225     Visit Number 1    Number of Visits 16    Date for PT Re-Evaluation 01/04/22    Authorization Type [employer]    Authorization Time Period 11/09/21-01/04/22    Progress Note Due on Visit 10    PT Start Time 1100    PT Stop Time 1200    PT Time Calculation (min) 60 min    Activity Tolerance Patient tolerated treatment well;No increased pain    Behavior During Therapy WFL for tasks assessed/performed             Past Medical History:  Diagnosis Date   Breast cancer ([place])    Depression    Hyperlipemia    IBS (irritable bowel syndrome)    Migraines    Personal history of radiation therapy     Past Surgical History:  Procedure Laterality Date   BREAST BIOPSY Left 08/16/2012   positive    BREAST LUMPECTOMY Left 08/31/2012   BREAST LUMPECTOMY WITH NEEDLE LOCALIZATION AND AXILLARY SENTINEL LYMPH NODE BX Left 08/31/2012   Procedure: left BREAST needle localized  LUMPECTOMY WITH left sentinel lymph node mapping ;  Surgeon: Marcello Moores A. Cornett, MD;  Location: [place];  Service: General;  Laterality: Left;  needle localization at breast center of [place] 7:30    BREAST SURGERY  08/31/12   [id number]   [place]  2010   COLONOSCOPY  04/24/2019   OOPHORECTOMY  Right 1999, Left 2001   POLYPECTOMY      There were no vitals filed for this visit.    Subjective Assessment - 11/09/21 1104     Subjective Pt looking for help with left hip bursitis after continued pain after injection.    Pertinent History Pt diagnosed with Left hip bursitis in  January and received and injection with partial good relief. Pt still has pain and achiness in that area. She reports about 4-6 months of left pain prior to formal diagnosis. Pt has most pain while sleeping at night, a side sleeper, forced to sleep on left side due to snoring issues and Rt shoulder pain. Pt inititally may have overloaded hip during a trip to [place] and daily hikes there in November 2022. Pt has not been able to do weekend [facility], but still walks her dog around the block.    How long can you sit comfortably? 1 hours    How long can you stand comfortably? not limited    How long can you walk comfortably? halfway throghout dog walk (hills are worse) similar both for mid day and after wokr    Currently in Pain? Yes    Pain Score 1     Pain Location --   left hip, just a dull achiness   Pain Relieving Factors takes meloxicam in spurts but mostly for Rt shoulder pain mostly; does not seem to be helping Left hip as much.    Effect of Pain on Daily Activities Has not been able to resume daily hikes and walks, disrupted sleep at night  [facility] PT Assessment - 11/09/21 0001       Assessment   Medical Diagnosis Left hip bursitis    Referring Provider (PT) Dr. Loura Pardon    Onset Date/Surgical Date --   Fall 2022   Hand Dominance Right    Next MD Visit as needed    Prior Therapy --   none     Precautions   Precautions None      Restrictions   Weight Bearing Restrictions No      Balance Screen   Has the patient fallen in the past 6 months No    Has the patient had a decrease in activity level because of a fear of falling?  No    Is the patient reluctant to leave their home because of a fear of falling?  No      Home Ecologist residence    Living Arrangements Spouse/significant other    Type of Home --   townhouse- 2nd floor bed and bath   Home Access Level entry    Home Layout Two level    [place] None       Prior Function   Level of Independence Independent    Vocation Full time employment   sitting, standing   Community education officer      Observation/Other Assessments   Focus on Therapeutic Outcomes (FOTO)  67             BLE Strength Assessment      Right  Left  Hip Flexion    Hip Horizontal ABDCT     Hip Horizontal ADD    Hip IR (seated)     Hip ER (seated)    Knee Extension    Knee Flexion (seated)    Ankle Dorsiflexion    Ankle Plantarflexion (seated)    Ankle Plantarflexion (SLS)     Hip ABDCT (supine)     Hip Extension (Supine @ 30 degrees)    Hip Extension (Prone SLR)    Hip Extension (Prone BLR)    Knee flexion (prone at 90 degrees)    Hip ER in prone    Hip IR in prone                  Objective measurements completed on examination: See above findings.                PT Education - 11/09/21 1225     Education Details Hip positions that create tendinous compression against greater trochanter and modifications    Person(s) Educated Patient    Methods Explanation;Demonstration    Comprehension Tactile cues required;Need further instruction                         Plan - 11/09/21 1226     Clinical Impression Statement Pt presenting wit ongoing Left hip pain despite partial symptoms reduction with injection several weeks ago. Pt educated on incidence of gluteal tendinous injury in the setting of bursitis findings, as well as signs/symptoms of gluteal tendinopathy. MMT revealing of generally symetrical strength with intermittent pain or giving way when left gluteus medius is tested in isolation. Pt also has focal pain reproduction with compressive postioning of gluteal tendon as well as focal palpation of the greater trochanter. Pt has adjacent gluteus meius trigger points that respond well to myofascial release with pain resolution. Educated pt on posturing adjustments at work and home and issued two HEP activities  after  successful performance in clinic. Advised on taking a holiday from [employer] stretches for while to avoid further tendon compression, but may integrate this later as needed for muscle stretch. Pt will benefit from skilld PT intervention to improve pain and actiivty tolerance ot allow pt to return to leisure and fitness activity.    Personal Factors and Comorbidities Age;Fitness;Time since onset of injury/illness/exacerbation;Behavior Pattern    Examination-Activity Limitations Bed Mobility;Locomotion Level;Stairs;Sit    Examination-Participation Restrictions Community Activity    Stability/Clinical Decision Making Stable/Uncomplicated    Clinical Decision Making Moderate    Rehab Potential Good    PT Frequency 2x / week    PT Duration 8 weeks    PT Treatment/Interventions Gait training;Stair training;Functional mobility training;Moist Heat;ADLs/Self Care Home Management;Cryotherapy;Electrical Stimulation;Therapeutic exercise;Balance training;Patient/family education;Dry needling;Passive range of motion    PT Next Visit Plan Review HEP assignments, repeat glute medius release    PT Home Exercise Plan Sidelying hip ABDCT from 0 degrees, seated hip IR c GreenTB resistance adn spacer for open hip position; Access Code: [email or web address]  URL: [email or web address]  Date: 11/09/2021  Prepared by: Rebbeca Paul    Exercises  - Sidelying Hip Abduction  - 1 x daily - 7 x weekly - 2 sets - 10 reps - 3 hold  - Seated Hip Internal Rotation with Ball and Resistance  - 1 x daily - 7 x weekly - 3 sets - 10 reps - 5 hold    Consulted and Agree with Plan of Care Patient             Patient will benefit from skilled therapeutic intervention in order to improve the following deficits and impairments:  Abnormal gait, Decreased activity tolerance, Decreased balance, Decreased range of motion, Decreased knowledge of precautions, Decreased knowledge of use of DME, Decreased mobility, Decreased strength  Visit  Diagnosis: Pain in left hip  Difficulty in walking, not elsewhere classified     Problem List Patient Active Problem List   Diagnosis Date Noted   Left hip pain 10/26/2021   Hip bursitis, left 07/22/2021   Feeling of chest tightness 05/26/2021   Stress reaction 05/26/2021   Environmental and seasonal allergies 05/12/2021   Mild intermittent asthma without complication 40/01/6760   Hyperlipidemia 10/21/2020   Routine general medical examination at a health care facility 10/13/2020   Vitamin D deficiency 10/13/2020   Left ankle injury 02/26/2020   Fracture of distal fibula 02/26/2020   Chest wall pain 01/18/2020   Paresthesia of right leg 01/18/2020   Smoker 01/18/2020   Irritable bowel syndrome with constipation 04/05/2019   Class 1 obesity due to excess calories without serious comorbidity with body mass index (BMI) of 30.0 to 30.9 in adult 04/05/2019   Chronic pain of right knee 04/05/2019    Martell Mcfadyen,Nalepa C, PT 11/09/2021, 12:58 PM  [place] [address] [place], [place], [address] Phone: [phone]   Fax:  [phone]  Name: Gennesis Hogland MRN: [id number] Date of Birth: [birth year]

## 2021-11-09 NOTE — Assessment & Plan Note (Signed)
Level of 34.8 Takes D sporadically   Enc her to get back on 1000 iu daily  Disc imp to bone and general health

## 2021-11-09 NOTE — Assessment & Plan Note (Signed)
Disc goals for lipids and reasons to control them Rev last labs with pt Rev low sat fat diet in detail Improved with low dose crestor with LDL down to 99 Good HDL Diet is good

## 2021-11-09 NOTE — Progress Notes (Signed)
Subjective:    Patient ID: Destiny Harrell, female    DOB: Nov 14, 1960, 61 y.o.   MRN: [id number]  HPI Here for health maintenance exam and to review chronic medical problems    Wt Readings from Last 3 Encounters:  11/09/21 159 lb 6 oz (72.3 kg)  10/26/21 164 lb 6.4 oz (74.6 kg)  07/27/21 163 lb 8 oz (74.2 kg)   28.92 kg/m  Feeling ok   Troch bursitis - hurts/had first PT today  Going on vacation next week/ to [place]    Immunization History  Administered Date(s) Administered   Influenza,inj,Quad PF,6+ Mos 04/04/2019   PFIZER(Purple Top)SARS-COV-2 Vaccination 08/31/2019, 09/25/2019, 06/02/2020, 07/08/2021   Tdap 04/04/2019   Zoster Recombinat (Shingrix) 12/10/2019, 07/08/2021    Smoking status :  1/2 ppd right now  Not ready to quit yet  No breathing problems or cough    H/o oophrectomy  Mammogram 9/22  Personal h/o breast cancer  Self breast exam : no lumps or changes  Mother had breast cancer at 80   Father died of lymphoma   Colonoscopy 04/2019 with 3 y recall  Pap 06/2019 nl with neg HPV screen 5 y recall No new problems (dryness)  No new partners   Dexa thinks it would be covered  No falls or fractures  Broke ankle a few years ago   Exercise- loves to walk and hike (off track due to hip bursitis) Now 1 mi day with dog   Vit D level 34.8 Not taking her vit D all the time    Hyperlipidemia  Lab Results  Component Value Date   CHOL 169 11/02/2021   CHOL 197 10/14/2020   CHOL 255 (H) 04/04/2019   Lab Results  Component Value Date   HDL 56.60 11/02/2021   HDL 54.40 10/14/2020   HDL 59.10 04/04/2019   Lab Results  Component Value Date   LDLCALC 99 11/02/2021   LDLCALC 124 (H) 10/14/2020   LDLCALC 167 (H) 04/04/2019   Lab Results  Component Value Date   TRIG 69.0 11/02/2021   TRIG 90.0 10/14/2020   TRIG 145.0 04/04/2019   Lab Results  Component Value Date   CHOLHDL 3 11/02/2021   CHOLHDL 4 10/14/2020   CHOLHDL 4 04/04/2019   No  results found for: LDLDIRECT  Eats fairly healthy   Has cut back on butter  (she missed med for a while also)  Crestor 5 mg daily  The 10-year ASCVD risk score (Arnett DK, et al., 2019) is: 5.3%   Values used to calculate the score:     Age: 40 years     Sex: Female     Is Non-Hispanic African American: No     Diabetic: No     Tobacco smoker: Yes     Systolic Blood Pressure: 086 mmHg     Is BP treated: No     HDL Cholesterol: 56.6 mg/dL     Total Cholesterol: 169 mg/dL   BP Readings from Last 3 Encounters:  11/09/21 126/74  10/26/21 114/70  07/27/21 100/70   Pulse Readings from Last 3 Encounters:  11/09/21 77  10/26/21 71  07/27/21 78   Lab Results  Component Value Date   CREATININE 0.72 11/02/2021   BUN 14 11/02/2021   NA 140 11/02/2021   K 5.2 No hemolysis seen (H) 11/02/2021   CL 109 11/02/2021   CO2 25 11/02/2021    Lab Results  Component Value Date   WBC 6.1 11/02/2021  HGB 14.0 11/02/2021   HCT 40.7 11/02/2021   MCV 94.2 11/02/2021   PLT 352.0 11/02/2021   Lab Results  Component Value Date   ALT 22 11/02/2021   AST 23 11/02/2021   ALKPHOS 46 11/02/2021   BILITOT 0.5 11/02/2021    Lab Results  Component Value Date   TSH 2.12 11/02/2021     .hsit  Review of Systems  Constitutional:  Negative for activity change, appetite change, fatigue, fever and unexpected weight change.  HENT:  Negative for congestion, ear pain, rhinorrhea, sinus pressure and sore throat.   Eyes:  Negative for pain, redness and visual disturbance.  Respiratory:  Negative for cough, shortness of breath and wheezing.   Cardiovascular:  Negative for chest pain and palpitations.  Gastrointestinal:  Negative for abdominal pain, blood in stool, constipation and diarrhea.  Endocrine: Negative for polydipsia and polyuria.  Genitourinary:  Negative for dysuria, frequency and urgency.  Musculoskeletal:  Negative for arthralgias, back pain and myalgias.       Hip pain   Skin:  Negative  for pallor and rash.  Allergic/Immunologic: Negative for environmental allergies.  Neurological:  Negative for dizziness, syncope and headaches.  Hematological:  Negative for adenopathy. Does not bruise/bleed easily.  Psychiatric/Behavioral:  Negative for decreased concentration and dysphoric mood. The patient is not nervous/anxious.       Objective:   Physical Exam Constitutional:      General: She is not in acute distress.    Appearance: Normal appearance. She is well-developed and normal weight. She is not ill-appearing or diaphoretic.  HENT:     Head: Normocephalic and atraumatic.     Right Ear: Tympanic membrane, ear canal and external ear normal.     Left Ear: Tympanic membrane, ear canal and external ear normal.     Nose: Nose normal. No congestion.     Mouth/Throat:     Mouth: Mucous membranes are moist.     Pharynx: Oropharynx is clear. No posterior oropharyngeal erythema.  Eyes:     General: No scleral icterus.    Extraocular Movements: Extraocular movements intact.     Conjunctiva/sclera: Conjunctivae normal.     Pupils: Pupils are equal, round, and reactive to light.  Neck:     Thyroid: No thyromegaly.     Vascular: No carotid bruit or JVD.  Cardiovascular:     Rate and Rhythm: Normal rate and regular rhythm.     Pulses: Normal pulses.     Heart sounds: Normal heart sounds.    No gallop.  Pulmonary:     Effort: Pulmonary effort is normal. No respiratory distress.     Breath sounds: Normal breath sounds. No wheezing.     Comments: Good air exch Chest:     Chest wall: No tenderness.  Abdominal:     General: Bowel sounds are normal. There is no distension or abdominal bruit.     Palpations: Abdomen is soft. There is no mass.     Tenderness: There is no abdominal tenderness.     Hernia: No hernia is present.  Genitourinary:    Comments: Breast exam: No mass, nodules, thickening, tenderness, bulging, retraction, inflamation, nipple discharge or skin changes noted.   No axillary or clavicular LA.     Musculoskeletal:        General: No tenderness. Normal range of motion.     Cervical back: Normal range of motion and neck supple. No rigidity. No muscular tenderness.     Right lower leg: No edema.  Left lower leg: No edema.     Comments: No kyphosis   Lymphadenopathy:     Cervical: No cervical adenopathy.  Skin:    General: Skin is warm and dry.     Coloration: Skin is not pale.     Findings: No erythema or rash.     Comments: Some lentigines   Few skin tags   Neurological:     Mental Status: She is alert. Mental status is at baseline.     Cranial Nerves: No cranial nerve deficit.     Motor: No abnormal muscle tone.     Coordination: Coordination normal.     Gait: Gait normal.     Deep Tendon Reflexes: Reflexes are normal and symmetric. Reflexes normal.  Psychiatric:        Mood and Affect: Mood normal.        Cognition and Memory: Cognition and memory normal.          Assessment & Plan:   Problem List Items Addressed This Visit       Musculoskeletal and Integument   Hip bursitis, left    Currently in PT   Holding off on another inj until done with that          Other   Estrogen deficiency    Referral for screening dexa   Smoker-inc risk        Relevant Orders   DG Bone Density   Hyperlipidemia    Disc goals for lipids and reasons to control them Rev last labs with pt Rev low sat fat diet in detail Improved with low dose crestor with LDL down to 99 Good HDL Diet is good       Routine general medical examination at a health care facility - Primary    Reviewed health habits including diet and exercise and skin cancer prevention Reviewed appropriate screening tests for age  Also reviewed health mt list, fam hx and immunization status , as well as social and family history   See HPI Labs reviewed  K is 5.2, possibly due to meloxicam imms reviewed  utd mamogram  utd colonoscopy wit 3 y recall due 04/2022  Pap  is utd  dexa ordered (inc risk of OP from smoking), inst to get back on track with vit D and exercise as tolerated Discussed smoking cessation        Smoker    Disc in detail risks of smoking and possible outcomes including copd, vascular/ heart disease, cancer , respiratory and sinus infections  Pt voices understanding Pt is not ready to quit No resp complaints       Vitamin D deficiency    Level of 34.8 Takes D sporadically   Enc her to get back on 1000 iu daily  Disc imp to bone and general health

## 2021-11-09 NOTE — Assessment & Plan Note (Signed)
Currently in PT   Holding off on another inj until done with that

## 2021-11-09 NOTE — Assessment & Plan Note (Signed)
Disc in detail risks of smoking and possible outcomes including copd, vascular/ heart disease, cancer , respiratory and sinus infections  Pt voices understanding Pt is not ready to quit No resp complaints

## 2021-11-09 NOTE — Assessment & Plan Note (Signed)
Referral for screening dexa   Smoker-inc risk

## 2021-11-09 NOTE — Patient Instructions (Addendum)
Get back on track with vitamin D   Your potassium level was a little high - we can watch that  Make sure to drink enough fluids   Take care of yourself  Stay as active as you can be   Consider quitting smoking in the future when you are ready    Call [facility] to schedule a bone density test when you can  Please call the location of your choice from the menu below to schedule your Mammogram and/or Bone Density appointment.    [facility]                      Phone:  [phone], [place] [address]                                                             Services: Traditional and 3D Mammogram, [place] Bone Density                 Phone: [phone] [address], [place] [address]    Service: Bone Density ONLY   *this site does NOT perform mammograms  [place]                        Phone:  [phone] [address], [place] [address]                                            Services:  3D Mammogram and [place] at [facility]   Phone:  [phone]   [place], [place] [address]                                            Services:  3D Mammogram and Bone Density  [place] at [facility])  Phone:  [phone]   [address] [place], [place] [address]                                              Services:  3D Mammogram and Bone Density

## 2021-11-09 NOTE — Assessment & Plan Note (Addendum)
Reviewed health habits including diet and exercise and skin cancer prevention Reviewed appropriate screening tests for age  Also reviewed health mt list, fam hx and immunization status , as well as social and family history   See HPI Labs reviewed  K is 5.2, possibly due to meloxicam imms reviewed  utd mamogram  utd colonoscopy wit 3 y recall due 04/2022  Pap is utd  dexa ordered (inc risk of OP from smoking), inst to get back on track with vit D and exercise as tolerated Discussed smoking cessation

## 2021-11-11 ENCOUNTER — Ambulatory Visit: Payer: PRIVATE HEALTH INSURANCE

## 2021-11-11 DIAGNOSIS — R262 Difficulty in walking, not elsewhere classified: Secondary | ICD-10-CM

## 2021-11-11 DIAGNOSIS — M25552 Pain in left hip: Secondary | ICD-10-CM

## 2021-11-11 NOTE — Therapy (Signed)
[place] MAIN [facility] SERVICES [address] [place], [place], [address] Phone: [phone]   Fax:  [phone]  Physical Therapy Treatment  Patient Details  Name: Destiny Harrell MRN: [id number] Date of Birth: 1960-07-09 Referring Provider (PT): Dr. Loura Pardon   Encounter Date: 11/11/2021   PT End of Session - 11/11/21 0912     Visit Number 2    Number of Visits 16    Date for PT Re-Evaluation 01/04/22    Authorization Type [employer]    Authorization Time Period 11/09/21-01/04/22    Progress Note Due on Visit 10    PT Start Time 0847    PT Stop Time 0927    PT Time Calculation (min) 40 min    Activity Tolerance Patient tolerated treatment well;No increased pain    Behavior During Therapy WFL for tasks assessed/performed             Past Medical History:  Diagnosis Date   Breast cancer ([place])    Depression    Hyperlipemia    IBS (irritable bowel syndrome)    Migraines    Personal history of radiation therapy     Past Surgical History:  Procedure Laterality Date   BREAST BIOPSY Left 08/16/2012   positive    BREAST LUMPECTOMY Left 08/31/2012   BREAST LUMPECTOMY WITH NEEDLE LOCALIZATION AND AXILLARY SENTINEL LYMPH NODE BX Left 08/31/2012   Procedure: left BREAST needle localized  LUMPECTOMY WITH left sentinel lymph node mapping ;  Surgeon: Marcello Moores A. Cornett, MD;  Location: [place];  Service: General;  Laterality: Left;  needle localization at breast center of [place] 7:30    BREAST SURGERY  08/31/12   ER+PR-HER-2neu-   [place]  2010   COLONOSCOPY  04/24/2019   OOPHORECTOMY  Right 1999, Left 2001   POLYPECTOMY      There were no vitals filed for this visit.   Subjective Assessment - 11/11/21 0858     Subjective Pt back for visit 2, reports good pain relief since myofascial work. Pt has been focusing on her position modification at work and at home including  sleep positions. She reports these to be making a big difference in her comfort.    Pertinent History Pt diagnosed with Left hip bursitis in January and received and injection with partial good relief. Pt still has pain and achiness in that area. She reports about 4-6 months of left pain prior to formal diagnosis. Pt has most pain while sleeping at night, a side sleeper, forced to sleep on left side due to snoring issues and Rt shoulder pain. Pt inititally may have overloaded hip during a trip to [place] and daily hikes there in November 2022. Pt has not been able to do weekend [facility], but still walks her dog around the block.    Currently in Pain? Yes    Pain Score 1    Ache near Left PSIS             INTERVENTION THIS DATE:  -gluteus medius release left lateral  -sidelying Left hip ABDCT c ankle weight 5lb 2x10 (avoiding adduction range right now)  -Seated bilat hip IR, Green band at ankles, pillows between knees 10x10secH  -glute max bridge + blue TB clam 1x10  -lateral side stepping 1x43f BTB bilat at knee level  -Left SLS c 30lb weight in RUE 6x10sec  -hip hikes on step 1x12 Left stance (consider  adding to HEP next session if symptoms are improving)        PT Short Term Goals - 11/10/21 1229       PT SHORT TERM GOAL #1   Title HEP independence and compliance.    Baseline Issued at Eval:    Time 3    Period Weeks    Status New    Target Date 12/01/21      PT SHORT TERM GOAL #2   Title Ability to complete full dog walk without aggravation of symptoms.    Baseline Eval: begins halfway through dog walk    Time 4    Period Weeks    Status New    Target Date 12/08/21      PT SHORT TERM GOAL #3   Title 5/5 MMT Left hip ABDCT and seated hip IR without pain.    Baseline Eval: 4+/5 ABDCT, IR painful    Time 4    Period Weeks    Status New    Target Date 12/08/21               PT Long Term Goals - 11/10/21 1233       PT LONG TERM GOAL #1   Title Pt to  report return to fitness walking hiking at 50-75% of prior or desired distance without aggravation of pain next day.    Baseline Eval: no exercise;    Time 6    Period Weeks    Status New    Target Date 12/22/21      PT LONG TERM GOAL #2   Title 5/5 MMT bilat hip extension, ABDCT, IR without pain.    Baseline Eval: *see note    Time 7    Period Weeks    Status New    Target Date 12/29/21      PT LONG TERM GOAL #3   Title FOTO score increase >15 points.    Baseline Eval: not yet increased    Time 8    Period Weeks    Status New    Target Date 01/05/22                   Plan - 11/11/21 0914     Clinical Impression Statement Returned to release work to Left glute medius, able to go deeper today. Focus on medisu loading with moderate resistance and avoiding positings of gluteal tendon compression. Success in effective gluteal targeting for muscle fatigue in session without aggravation of trochanteric pain.    Personal Factors and Comorbidities Age;Fitness;Time since onset of injury/illness/exacerbation;Behavior Pattern    Examination-Activity Limitations Bed Mobility;Locomotion Level;Stairs;Sit    Examination-Participation Restrictions Community Activity    Stability/Clinical Decision Making Stable/Uncomplicated    Clinical Decision Making Moderate    Rehab Potential Good    PT Frequency 2x / week    PT Duration 8 weeks    PT Treatment/Interventions Gait training;Stair training;Functional mobility training;Moist Heat;ADLs/Self Care Home Management;Cryotherapy;Electrical Stimulation;Therapeutic exercise;Balance training;Patient/family education;Dry needling;Passive range of motion    PT Next Visit Plan Review HEP assignments, repeat glute medius release    PT Home Exercise Plan Sidelying hip ABDCT from 0 degrees, seated hip IR c GreenTB resistance adn spacer for open hip position; Access Code: [email or web address]  URL: [email or web address]  Date: 11/09/2021  Prepared by:  Rebbeca Paul    Exercises  - Sidelying Hip Abduction  - 1 x daily - 7 x weekly - 2 sets - 10 reps - 3 hold  - Seated Hip  Internal Rotation with Diona Foley and Resistance  - 1 x daily - 7 x weekly - 3 sets - 10 reps - 5 hold    Consulted and Agree with Plan of Care Patient             Patient will benefit from skilled therapeutic intervention in order to improve the following deficits and impairments:  Abnormal gait, Decreased activity tolerance, Decreased balance, Decreased range of motion, Decreased knowledge of precautions, Decreased knowledge of use of DME, Decreased mobility, Decreased strength  Visit Diagnosis: Pain in left hip  Difficulty in walking, not elsewhere classified     Problem List Patient Active Problem List   Diagnosis Date Noted   Estrogen deficiency 11/09/2021   Left hip pain 10/26/2021   Hip bursitis, left 07/22/2021   Feeling of chest tightness 05/26/2021   Stress reaction 05/26/2021   Environmental and seasonal allergies 05/12/2021   Mild intermittent asthma without complication 77/93/9688   Hyperlipidemia 10/21/2020   Routine general medical examination at a health care facility 10/13/2020   Vitamin D deficiency 10/13/2020   Fracture of distal fibula 02/26/2020   Smoker 01/18/2020   Irritable bowel syndrome with constipation 04/05/2019   9:35 AM, 11/11/21 Etta Grandchild, PT, DPT Physical Therapist - [facility]  [phone])    [place] C, PT 11/11/2021, 9:23 AM  [place] MAIN [facility] SERVICES [address] [place], [place], [address] Phone: [phone]   Fax:  [phone]  Name: Destiny Harrell MRN: [id number] Date of Birth: 01/16/1961

## 2021-11-17 ENCOUNTER — Ambulatory Visit: Payer: PRIVATE HEALTH INSURANCE

## 2021-11-17 DIAGNOSIS — M25552 Pain in left hip: Secondary | ICD-10-CM | POA: Diagnosis not present

## 2021-11-17 DIAGNOSIS — R262 Difficulty in walking, not elsewhere classified: Secondary | ICD-10-CM

## 2021-11-17 NOTE — Therapy (Signed)
[place] MAIN [facility] SERVICES [address] [place], [place], [address] Phone: [phone]   Fax:  [phone]  Physical Therapy Treatment  Patient Details  Name: Destiny Harrell MRN: [id number] Date of Birth: Sep 20, 1960 Referring Provider (PT): Dr. Loura Pardon   Encounter Date: 11/17/2021   PT End of Session - 11/17/21 1156     Visit Number 3    Number of Visits 16    Date for PT Re-Evaluation 01/04/22    Authorization Type [employer]    Authorization Time Period 11/09/21-01/04/22    Authorization - Number of Visits 3    Progress Note Due on Visit 10    PT Start Time 1100    PT Stop Time 1145    PT Time Calculation (min) 45 min    Activity Tolerance Patient tolerated treatment well;No increased pain    Behavior During Therapy WFL for tasks assessed/performed             Past Medical History:  Diagnosis Date   Breast cancer ([place])    Depression    Hyperlipemia    IBS (irritable bowel syndrome)    Migraines    Personal history of radiation therapy     Past Surgical History:  Procedure Laterality Date   BREAST BIOPSY Left 08/16/2012   positive    BREAST LUMPECTOMY Left 08/31/2012   BREAST LUMPECTOMY WITH NEEDLE LOCALIZATION AND AXILLARY SENTINEL LYMPH NODE BX Left 08/31/2012   Procedure: left BREAST needle localized  LUMPECTOMY WITH left sentinel lymph node mapping ;  Surgeon: Marcello Moores A. Cornett, MD;  Location: [place];  Service: General;  Laterality: Left;  needle localization at breast center of [place] 7:30    BREAST SURGERY  08/31/12   ER+PR-HER-2neu-   [place]  2010   COLONOSCOPY  04/24/2019   OOPHORECTOMY  Right 1999, Left 2001   POLYPECTOMY      There were no vitals filed for this visit.   Subjective Assessment - 11/17/21 1155     Subjective Patient reports she will be leaving for [facility] after her appointment thursday. Has been compliant with HEP.     Pertinent History Pt diagnosed with Left hip bursitis in January and received and injection with partial good relief. Pt still has pain and achiness in that area. She reports about 4-6 months of left pain prior to formal diagnosis. Pt has most pain while sleeping at night, a side sleeper, forced to sleep on left side due to snoring issues and Rt shoulder pain. Pt inititally may have overloaded hip during a trip to [place] and daily hikes there in November 2022. Pt has not been able to do weekend [facility], but still walks her dog around the block.    Currently in Pain? Yes    Pain Score 1     Pain Location Hip    Pain Orientation Left    Pain Descriptors / Indicators Aching    Pain Type Chronic pain                      Treatment:  Sidelying:  -abduction with 5lb ankle weight LLE 10x 2 sets -hip extension with 5lb ankle weight LLE 10x 2 sets -adduction 10x with 5lb ankle weight -modified lateral plank with clamshell 10x -rolling stick to L IT band x 4 minutes  Supine: Glue bridge with posterior pelvic tilt and adduction ball squeeze 15x;  Posterior pelvic tilt 10x 3 second holds SAD with belt in hooklying position 4x30 seconds SAD in figure four position inferior glide 4x 30 second Hamstring lengthening LLE 60 seconds with leg on PT shoulder Cross body hip Harrell 60 seconds x 2 trials with PT overpressure   Standing: SLS 30 seconds each LE (added to HEP   Trigger Point Dry Needling (TDN), unbilled Education performed with patient regarding potential benefit of TDN. Reviewed precautions and risks with patient. Reviewed special precautions/risks over lung fields which include pneumothorax. Reviewed signs and symptoms of pneumothorax and advised pt to go to ER immediately if these symptoms develop advise them of dry needling treatment. Extensive time spent with pt to ensure full understanding of TDN risks. Pt provided verbal consent to treatment. TDN performed to  with  0.3 x 60 and .75 single needle placements with local twitch response (LTR). Pistoning technique utilized. Improved pain-free motion following intervention. L glute and TFL targeted x 3 minutes     Pt educated throughout session about proper posture and technique with exercises. Improved exercise technique, movement at target joints, use of target muscles after min to mod verbal, visual, tactile cues. Rationale for Evaluation and Treatment Rehabilitation   Patient presents with excellent motivation to physical therapy. She does have fatigue with repeated abduction and hip extension with weights indicating need for continuous strengthening focus. Introduction to TDN tolerated well with no pain increase, if patient has good tolerance to TDN with perform on IT band next session. Pt will benefit from skilld PT intervention to improve pain and actiivty tolerance ot allow pt to return to leisure and fitness activity              PT Education - 11/17/21 1156     Education Details exercise technique, use of roller, TDN    Person(s) Educated Patient    Methods Explanation;Demonstration;Tactile cues;Verbal cues    Comprehension Verbalized understanding;Returned demonstration;Verbal cues required              PT Short Term Goals - 11/10/21 1229       PT SHORT TERM GOAL #1   Title HEP independence and compliance.    Baseline Issued at Eval:    Time 3    Period Weeks    Status New    Target Date 12/01/21      PT SHORT TERM GOAL #2   Title Ability to complete full dog walk without aggravation of symptoms.    Baseline Eval: begins halfway through dog walk    Time 4    Period Weeks    Status New    Target Date 12/08/21      PT SHORT TERM GOAL #3   Title 5/5 MMT Left hip ABDCT and seated hip IR without pain.    Baseline Eval: 4+/5 ABDCT, IR painful    Time 4    Period Weeks    Status New    Target Date 12/08/21               PT Long Term Goals - 11/10/21 1233        PT LONG TERM GOAL #1   Title Pt to report return to fitness walking hiking at 50-75% of prior or desired distance without aggravation of pain next day.    Baseline Eval: no exercise;    Time 6    Period Weeks    Status New    Target Date 12/22/21      PT LONG TERM  GOAL #2   Title 5/5 MMT bilat hip extension, ABDCT, IR without pain.    Baseline Eval: *see note    Time 7    Period Weeks    Status New    Target Date 12/29/21      PT LONG TERM GOAL #3   Title FOTO score increase >15 points.    Baseline Eval: not yet increased    Time 8    Period Weeks    Status New    Target Date 01/05/22                   Plan - 11/17/21 1157     Clinical Impression Statement Patient presents with excellent motivation to physical therapy. She does have fatigue with repeated abduction and hip extension with weights indicating need for continuous strengthening focus. Introduction to TDN tolerated well with no pain increase, if patient has good tolerance to TDN with perform on IT band next session. Pt will benefit from skilled PT intervention to improve pain and activity tolerance to allow pt to return to leisure and fitness activity    Personal Factors and Comorbidities Age;Fitness;Time since onset of injury/illness/exacerbation;Behavior Pattern    Examination-Activity Limitations Bed Mobility;Locomotion Level;Stairs;Sit    Examination-Participation Restrictions Community Activity    Stability/Clinical Decision Making Stable/Uncomplicated    Rehab Potential Good    PT Frequency 2x / week    PT Duration 8 weeks    PT Treatment/Interventions Gait training;Stair training;Functional mobility training;Moist Heat;ADLs/Self Care Home Management;Cryotherapy;Electrical Stimulation;Therapeutic exercise;Balance training;Patient/family education;Dry needling;Passive range of motion    PT Next Visit Plan Review HEP assignments, repeat glute medius release    PT Home Exercise Plan Sidelying hip ABDCT from  0 degrees, seated hip IR c GreenTB resistance adn spacer for open hip position; Access Code: [email or web address]  URL: [email or web address]  Date: 11/09/2021  Prepared by: Rebbeca Paul    Exercises  - Sidelying Hip Abduction  - 1 x daily - 7 x weekly - 2 sets - 10 reps - 3 hold  - Seated Hip Internal Rotation with Ball and Resistance  - 1 x daily - 7 x weekly - 3 sets - 10 reps - 5 hold    Consulted and Agree with Plan of Care Patient             Patient will benefit from skilled therapeutic intervention in order to improve the following deficits and impairments:  Abnormal gait, Decreased activity tolerance, Decreased balance, Decreased range of motion, Decreased knowledge of precautions, Decreased knowledge of use of DME, Decreased mobility, Decreased strength  Visit Diagnosis: Pain in left hip  Difficulty in walking, not elsewhere classified     Problem List Patient Active Problem List   Diagnosis Date Noted   Estrogen deficiency 11/09/2021   Left hip pain 10/26/2021   Hip bursitis, left 07/22/2021   Feeling of chest tightness 05/26/2021   Stress reaction 05/26/2021   Environmental and seasonal allergies 05/12/2021   Mild intermittent asthma without complication 79/15/0569   Hyperlipidemia 10/21/2020   Routine general medical examination at a health care facility 10/13/2020   Vitamin D deficiency 10/13/2020   Fracture of distal fibula 02/26/2020   Smoker 01/18/2020   Irritable bowel syndrome with constipation 04/05/2019    Janna Arch, PT, DPT  11/17/2021, 11:58 AM  [place] [address] [place], [place], [address] Phone: [phone]   Fax:  [phone]  Name: Destiny Harrell MRN: [id number] Date of  Birth: 1960-10-22

## 2021-11-19 ENCOUNTER — Ambulatory Visit: Payer: PRIVATE HEALTH INSURANCE | Attending: Family Medicine

## 2021-11-19 DIAGNOSIS — M25552 Pain in left hip: Secondary | ICD-10-CM | POA: Insufficient documentation

## 2021-11-19 DIAGNOSIS — R262 Difficulty in walking, not elsewhere classified: Secondary | ICD-10-CM | POA: Insufficient documentation

## 2021-11-19 NOTE — Therapy (Signed)
[place] [facility] SERVICES [address] [place], [place], [address] Phone: [phone]   Fax:  [phone]  Physical Therapy Treatment  Patient Details  Name: Destiny Harrell MRN: [id number] Date of Birth: [birth year] Referring Provider (PT): Dr. Loura Pardon   Encounter Date: 11/19/2021   PT End of Session - 11/19/21 0830     Visit Number 4    Number of Visits 16    Date for PT Re-Evaluation 01/04/22    Authorization Type [employer]    Authorization Time Period 11/09/21-01/04/22    Authorization - Number of Visits 4    Progress Note Due on Visit 10    PT Start Time 0800    PT Stop Time 4097    PT Time Calculation (min) 44 min    Activity Tolerance Patient tolerated treatment well;No increased pain    Behavior During Therapy WFL for tasks assessed/performed             Past Medical History:  Diagnosis Date   Breast cancer ([place])    Depression    Hyperlipemia    IBS (irritable bowel syndrome)    Migraines    Personal history of radiation therapy     Past Surgical History:  Procedure Laterality Date   BREAST BIOPSY Left 08/16/2012   positive    BREAST LUMPECTOMY Left 08/31/2012   BREAST LUMPECTOMY WITH NEEDLE LOCALIZATION AND AXILLARY SENTINEL LYMPH NODE BX Left 08/31/2012   Procedure: left BREAST needle localized  LUMPECTOMY WITH left sentinel lymph node mapping ;  Surgeon: Marcello Moores A. Cornett, MD;  Location: [place];  Service: General;  Laterality: Left;  needle localization at breast center of [place] 7:30    BREAST SURGERY  08/31/12   ER+PR-HER-[id number]-   [place]  2010   COLONOSCOPY  04/24/2019   OOPHORECTOMY  Right 1999, Left 2001   POLYPECTOMY      There were no vitals filed for this visit.   Subjective Assessment - 11/19/21 0824     Subjective Patient reports TDN helped her pain significantly. Continue to be present but is more localized now. Compliant  with HEP and will leave for [place] this afternoon.    Pertinent History Pt diagnosed with Left hip bursitis in January and received and injection with partial good relief. Pt still has pain and achiness in that area. She reports about 4-6 months of left pain prior to formal diagnosis. Pt has most pain while sleeping at night, a side sleeper, forced to sleep on left side due to snoring issues and Rt shoulder pain. Pt inititally may have overloaded hip during a trip to [place] and daily hikes there in November 2022. Pt has not been able to do weekend [facility], but still walks her dog around the block.    Currently in Pain? Yes    Pain Score 2     Pain Location Hip    Pain Orientation Left    Pain Descriptors / Indicators Aching    Pain Type Chronic pain    Pain Onset In the past 7 days    Pain Frequency Intermittent                   Treatment:  Sidelying:  -abduction with 5lb ankle weight LLE 10x 2 sets -hip extension with 5lb ankle weight LLE 10x 2 sets -adduction 10x with 5lb ankle weight; x 2 sets -modified lateral plank with  clamshell 10x    Supine: Glue bridge with GTB abduction pulses 10x, 5 reps  Posterior pelvic tilt 10x 3 second holds SAD with belt in hooklying position 4x30 seconds SAD in figure four position inferior glide 4x 30 second Posterior pelvic tilt with adduction ball squeeze 12x 3 second holds  Standing: SLS squat LLE with UE support 10x Standing hip flexor lengthening stretch for car trip 30 seconds each LE IT band car stretch for trip 30 seconds each LE     Trigger Point Dry Needling (TDN), unbilled Education performed with patient regarding potential benefit of TDN. Reviewed precautions and risks with patient. Reviewed special precautions/risks over lung fields which include pneumothorax. Reviewed signs and symptoms of pneumothorax and advised pt to go to ER immediately if these symptoms develop advise them of dry needling treatment. Extensive  time spent with pt to ensure full understanding of TDN risks. Pt provided verbal consent to treatment. TDN performed to  with 0.3 x 60 and .75 single needle placements with local twitch response (LTR). Pistoning technique utilized. Improved pain-free motion following intervention. L glute, L IT band, and TFL targeted x 7 minutes        Pt educated throughout session about proper posture and technique with exercises. Improved exercise technique, movement at target joints, use of target muscles after min to mod verbal, visual, tactile cues. Rationale for Evaluation and Treatment Rehabilitation    Patient introduced to standing stretches for car trip with demonstration of understanding. Multiple trigger points released with TDN this session. Patient tolerates progressive strengthening interventions with no pain. Will return after her trip for continuation of therapy. Pt will benefit from skilled PT intervention to improve pain and activity tolerance to allow pt to return to leisure and fitness activity                  PT Education - 11/19/21 0823     Education Details execise technique, body mechanics    Person(s) Educated Patient    Methods Explanation;Demonstration;Tactile cues;Verbal cues    Comprehension Verbalized understanding;Returned demonstration;Verbal cues required;Tactile cues required              PT Short Term Goals - 11/10/21 1229       PT SHORT TERM GOAL #1   Title HEP independence and compliance.    Baseline Issued at Eval:    Time 3    Period Weeks    Status New    Target Date 12/01/21      PT SHORT TERM GOAL #2   Title Ability to complete full dog walk without aggravation of symptoms.    Baseline Eval: begins halfway through dog walk    Time 4    Period Weeks    Status New    Target Date 12/08/21      PT SHORT TERM GOAL #3   Title 5/5 MMT Left hip ABDCT and seated hip IR without pain.    Baseline Eval: 4+/5 ABDCT, IR painful    Time 4     Period Weeks    Status New    Target Date 12/08/21               PT Long Term Goals - 11/10/21 1233       PT LONG TERM GOAL #1   Title Pt to report return to fitness walking hiking at 50-75% of prior or desired distance without aggravation of pain next day.    Baseline Eval: no exercise;  Time 6    Period Weeks    Status New    Target Date 12/22/21      PT LONG TERM GOAL #2   Title 5/5 MMT bilat hip extension, ABDCT, IR without pain.    Baseline Eval: *see note    Time 7    Period Weeks    Status New    Target Date 12/29/21      PT LONG TERM GOAL #3   Title FOTO score increase >15 points.    Baseline Eval: not yet increased    Time 8    Period Weeks    Status New    Target Date 01/05/22                   Plan - 11/19/21 0906     Clinical Impression Statement Patient introduced to standing stretches for car trip with demonstration of understanding. Multiple trigger points released with TDN this session. Patient tolerates progressive strengthening interventions with no pain. Will return after her trip for continuation of therapy. Pt will benefit from skilled PT intervention to improve pain and activity tolerance to allow pt to return to leisure and fitness activity    Personal Factors and Comorbidities Age;Fitness;Time since onset of injury/illness/exacerbation;Behavior Pattern    Examination-Activity Limitations Bed Mobility;Locomotion Level;Stairs;Sit    Examination-Participation Restrictions Community Activity    Stability/Clinical Decision Making Stable/Uncomplicated    Rehab Potential Good    PT Frequency 2x / week    PT Duration 8 weeks    PT Treatment/Interventions Gait training;Stair training;Functional mobility training;Moist Heat;ADLs/Self Care Home Management;Cryotherapy;Electrical Stimulation;Therapeutic exercise;Balance training;Patient/family education;Dry needling;Passive range of motion    PT Next Visit Plan Review HEP assignments, repeat  glute medius release    PT Home Exercise Plan Sidelying hip ABDCT from 0 degrees, seated hip IR c GreenTB resistance adn spacer for open hip position; Access Code: [email or web address]  URL: [email or web address]  Date: 11/09/2021  Prepared by: Rebbeca Paul    Exercises  - Sidelying Hip Abduction  - 1 x daily - 7 x weekly - 2 sets - 10 reps - 3 hold  - Seated Hip Internal Rotation with Ball and Resistance  - 1 x daily - 7 x weekly - 3 sets - 10 reps - 5 hold    Consulted and Agree with Plan of Care Patient             Patient will benefit from skilled therapeutic intervention in order to improve the following deficits and impairments:  Abnormal gait, Decreased activity tolerance, Decreased balance, Decreased range of motion, Decreased knowledge of precautions, Decreased knowledge of use of DME, Decreased mobility, Decreased strength  Visit Diagnosis: Pain in left hip  Difficulty in walking, not elsewhere classified     Problem List Patient Active Problem List   Diagnosis Date Noted   Estrogen deficiency 11/09/2021   Left hip pain 10/26/2021   Hip bursitis, left 07/22/2021   Feeling of chest tightness 05/26/2021   Stress reaction 05/26/2021   Environmental and seasonal allergies 05/12/2021   Mild intermittent asthma without complication 17/00/1749   Hyperlipidemia 10/21/2020   Routine general medical examination at a health care facility 10/13/2020   Vitamin D deficiency 10/13/2020   Fracture of distal fibula 02/26/2020   Smoker 01/18/2020   Irritable bowel syndrome with constipation 04/05/2019   Janna Arch, PT, DPT  11/19/2021, 9:07 AM  [facility] [address] [place], [place], [address] Phone: [phone]  Fax:  [phone]  Name: Destiny Harrell MRN: [id number] Date of Birth: [birth year]

## 2021-12-01 ENCOUNTER — Ambulatory Visit: Payer: PRIVATE HEALTH INSURANCE | Admitting: Physical Therapy

## 2021-12-01 ENCOUNTER — Encounter: Payer: Self-pay | Admitting: Physical Therapy

## 2021-12-01 DIAGNOSIS — M25552 Pain in left hip: Secondary | ICD-10-CM | POA: Diagnosis not present

## 2021-12-01 DIAGNOSIS — R262 Difficulty in walking, not elsewhere classified: Secondary | ICD-10-CM

## 2021-12-01 NOTE — Therapy (Signed)
[place] MAIN [facility] SERVICES [address] [place], [place], [address] Phone: [phone]   Fax:  [phone]  Physical Therapy Treatment  Patient Details  Name: Destiny Harrell MRN: [id number] Date of Birth: 03-29-1961 Referring Provider (PT): Dr. Loura Pardon   Encounter Date: 12/01/2021   PT End of Session - 12/01/21 0810     Visit Number 5    Number of Visits 16    Date for PT Re-Evaluation 01/04/22    Authorization Type [employer]    Authorization Time Period 11/09/21-01/04/22    Authorization - Number of Visits 4    Progress Note Due on Visit 10    PT Start Time 0804    PT Stop Time 0845    PT Time Calculation (min) 41 min    Activity Tolerance Patient tolerated treatment well;No increased pain    Behavior During Therapy WFL for tasks assessed/performed             Past Medical History:  Diagnosis Date   Breast cancer ([place])    Depression    Hyperlipemia    IBS (irritable bowel syndrome)    Migraines    Personal history of radiation therapy     Past Surgical History:  Procedure Laterality Date   BREAST BIOPSY Left 08/16/2012   positive    BREAST LUMPECTOMY Left 08/31/2012   BREAST LUMPECTOMY WITH NEEDLE LOCALIZATION AND AXILLARY SENTINEL LYMPH NODE BX Left 08/31/2012   Procedure: left BREAST needle localized  LUMPECTOMY WITH left sentinel lymph node mapping ;  Surgeon: Marcello Moores A. Cornett, MD;  Location: [place];  Service: General;  Laterality: Left;  needle localization at breast center of [place] 7:30    BREAST SURGERY  08/31/12   ER+PR-HER-2neu-   [place]  2010   COLONOSCOPY  04/24/2019   OOPHORECTOMY  Right 1999, Left 2001   POLYPECTOMY      There were no vitals filed for this visit.   Subjective Assessment - 12/01/21 0808     Subjective Patient reports doing well. She had a good trip. She reports no pain right now. She reports getting a rolling  stick and it has significantly helped her pain in the last few days.    Pertinent History Pt diagnosed with Left hip bursitis in January and received and injection with partial good relief. Pt still has pain and achiness in that area. She reports about 4-6 months of left pain prior to formal diagnosis. Pt has most pain while sleeping at night, a side sleeper, forced to sleep on left side due to snoring issues and Rt shoulder pain. Pt inititally may have overloaded hip during a trip to [place] and daily hikes there in November 2022. Pt has not been able to do weekend [facility], but still walks her dog around the block.    Currently in Pain? No/denies    Pain Onset In the past 7 days                       Treatment:  Supine: Lumbar trunk rotation x1 min with cues to avoid painful ROM;  Single knee to chest stretch 20 sec hold x1 rep each LE PT performed passive hamstring stretch 30 sec hold  Progressed to ankle DF/PF neural stretch x10 reps Hooklying: Green tband hip abduction/ER x10 reps;  Gluteal bridge with GTB abduction pulses x15 reps Posterior pelvic tilt 10x 3  second holds  Sidelying:  Green tband clamshell 2x10 reps;  -abduction with 5lb ankle weight small circles 2x5 reps clockwise/counterclockwise   Qped: Hip extension 5# x10 reps each LE; Hip abduction fire hydrant 5# x10 reps each LE Gluteal hip extension (knee flexed) 5# x10 reps each LE Hip circles (abduction) 5# x10 reps each LE  Hooklying: IT band stretch with leg crossed 15 sec hold x2 reps Piriformis stretch 20 sec hold x2 reps each LE;       Pt educated throughout session about proper posture and technique with exercises. Improved exercise technique, movement at target joints, use of target muscles after min to mod verbal, visual, tactile cues. Rationale for Evaluation and Treatment Rehabilitation                           PT Education - 12/01/21 0810     Education Details  exercise technique/positioning;    Person(s) Educated Patient    Methods Explanation;Verbal cues    Comprehension Verbalized understanding;Returned demonstration;Verbal cues required;Need further instruction              PT Short Term Goals - 11/10/21 1229       PT SHORT TERM GOAL #1   Title HEP independence and compliance.    Baseline Issued at Eval:    Time 3    Period Weeks    Status New    Target Date 12/01/21      PT SHORT TERM GOAL #2   Title Ability to complete full dog walk without aggravation of symptoms.    Baseline Eval: begins halfway through dog walk    Time 4    Period Weeks    Status New    Target Date 12/08/21      PT SHORT TERM GOAL #3   Title 5/5 MMT Left hip ABDCT and seated hip IR without pain.    Baseline Eval: 4+/5 ABDCT, IR painful    Time 4    Period Weeks    Status New    Target Date 12/08/21               PT Long Term Goals - 11/10/21 1233       PT LONG TERM GOAL #1   Title Pt to report return to fitness walking hiking at 50-75% of prior or desired distance without aggravation of pain next day.    Baseline Eval: no exercise;    Time 6    Period Weeks    Status New    Target Date 12/22/21      PT LONG TERM GOAL #2   Title 5/5 MMT bilat hip extension, ABDCT, IR without pain.    Baseline Eval: *see note    Time 7    Period Weeks    Status New    Target Date 12/29/21      PT LONG TERM GOAL #3   Title FOTO score increase >15 points.    Baseline Eval: not yet increased    Time 8    Period Weeks    Status New    Target Date 01/05/22                   Plan - 12/01/21 0815     Clinical Impression Statement Patient motivated and participated well within session. She denies any pain today. she reports having a good trip and was able to tolerate traveling well. Patient instructed in advanced LE stretches  and core/hip strengthening. Patient does require min VCS for proper exercise technique for optimal strengthening;Pt  tolerated well without increase in pain. Plan to see patient for at least 1 more week and if she is still pain free next week consider discharge from PT.    Personal Factors and Comorbidities Age;Fitness;Time since onset of injury/illness/exacerbation;Behavior Pattern    Examination-Activity Limitations Bed Mobility;Locomotion Level;Stairs;Sit    Examination-Participation Restrictions Community Activity    Stability/Clinical Decision Making Stable/Uncomplicated    Rehab Potential Good    PT Frequency 2x / week    PT Duration 8 weeks    PT Treatment/Interventions Gait training;Stair training;Functional mobility training;Moist Heat;ADLs/Self Care Home Management;Cryotherapy;Electrical Stimulation;Therapeutic exercise;Balance training;Patient/family education;Dry needling;Passive range of motion    PT Next Visit Plan Review HEP assignments, repeat glute medius release    PT Home Exercise Plan Sidelying hip ABDCT from 0 degrees, seated hip IR c GreenTB resistance adn spacer for open hip position; Access Code: [email or web address]  URL: [email or web address]  Date: 11/09/2021  Prepared by: Rebbeca Paul    Exercises  - Sidelying Hip Abduction  - 1 x daily - 7 x weekly - 2 sets - 10 reps - 3 hold  - Seated Hip Internal Rotation with Ball and Resistance  - 1 x daily - 7 x weekly - 3 sets - 10 reps - 5 hold    Consulted and Agree with Plan of Care Patient             Patient will benefit from skilled therapeutic intervention in order to improve the following deficits and impairments:  Abnormal gait, Decreased activity tolerance, Decreased balance, Decreased range of motion, Decreased knowledge of precautions, Decreased knowledge of use of DME, Decreased mobility, Decreased strength  Visit Diagnosis: Pain in left hip  Difficulty in walking, not elsewhere classified     Problem List Patient Active Problem List   Diagnosis Date Noted   Estrogen deficiency 11/09/2021   Left hip pain 10/26/2021    Hip bursitis, left 07/22/2021   Feeling of chest tightness 05/26/2021   Stress reaction 05/26/2021   Environmental and seasonal allergies 05/12/2021   Mild intermittent asthma without complication 91/91/6606   Hyperlipidemia 10/21/2020   Routine general medical examination at a health care facility 10/13/2020   Vitamin D deficiency 10/13/2020   Fracture of distal fibula 02/26/2020   Smoker 01/18/2020   Irritable bowel syndrome with constipation 04/05/2019    Karen Huhta, PT, DPT 12/01/2021, 8:52 AM  [facility] [address] [place], [place], [address] Phone: [phone]   Fax:  [phone]  Name: Dorrine Montone MRN: [id number] Date of Birth: 06/24/1960

## 2021-12-03 ENCOUNTER — Ambulatory Visit: Payer: PRIVATE HEALTH INSURANCE | Admitting: Physical Therapy

## 2021-12-03 DIAGNOSIS — M25552 Pain in left hip: Secondary | ICD-10-CM | POA: Diagnosis not present

## 2021-12-03 DIAGNOSIS — R262 Difficulty in walking, not elsewhere classified: Secondary | ICD-10-CM

## 2021-12-03 NOTE — Therapy (Signed)
[place] MAIN [facility] SERVICES [address] [place], [place], [address] Phone: [phone]   Fax:  [phone]  Physical Therapy Treatment  Patient Details  Name: Destiny Harrell MRN: [id number] Date of Birth: [birth year] Referring Provider (PT): Dr. Loura Pardon   Encounter Date: 12/03/2021   PT End of Session - 12/03/21 0933     Visit Number 6    Number of Visits 16    Date for PT Re-Evaluation 01/04/22    Authorization Type [employer]    Authorization Time Period 11/09/21-01/04/22    Authorization - Number of Visits 4    Progress Note Due on Visit 10    PT Start Time 0849    PT Stop Time 0929    PT Time Calculation (min) 40 min    Activity Tolerance Patient tolerated treatment well;No increased pain    Behavior During Therapy WFL for tasks assessed/performed             Past Medical History:  Diagnosis Date   Breast cancer ([place])    Depression    Hyperlipemia    IBS (irritable bowel syndrome)    Migraines    Personal history of radiation therapy     Past Surgical History:  Procedure Laterality Date   BREAST BIOPSY Left 08/16/2012   positive    BREAST LUMPECTOMY Left 08/31/2012   BREAST LUMPECTOMY WITH NEEDLE LOCALIZATION AND AXILLARY SENTINEL LYMPH NODE BX Left 08/31/2012   Procedure: left BREAST needle localized  LUMPECTOMY WITH left sentinel lymph node mapping ;  Surgeon: Marcello Moores A. Cornett, MD;  Location: [facility];  Service: General;  Laterality: Left;  needle localization at breast center of [place] 7:30    BREAST SURGERY  08/31/12   ER+PR-HER-2neu-   [place]  2010   COLONOSCOPY  04/24/2019   OOPHORECTOMY  Right 1999, Left 2001   POLYPECTOMY      There were no vitals filed for this visit.   Subjective Assessment - 12/03/21 0851     Subjective Pt reports significant soreness in her L hip following her previous session. Pt reports the pain did improve  within the last 2 days and now feels " tight" but does not describe it as pain.    Pertinent History Pt diagnosed with Left hip bursitis in January and received and injection with partial good relief. Pt still has pain and achiness in that area. She reports about 4-6 months of left pain prior to formal diagnosis. Pt has most pain while sleeping at night, a side sleeper, forced to sleep on left side due to snoring issues and Rt shoulder pain. Pt inititally may have overloaded hip during a trip to [place] and daily hikes there in November 2022. Pt has not been able to do weekend [facility], but still walks her dog around the block.    How long can you sit comfortably? 1 hours    How long can you stand comfortably? not limited    How long can you walk comfortably? halfway throghout dog walk (hills are worse) similar both for mid day and after wokr    Currently in Pain? No/denies    Pain Onset In the past 7 days                   Treatment:  Supine: Lumbar trunk rotation x1 min with cues to avoid painful ROM;  Single knee to chest stretch 20  sec hold x2 rep on LLE  PT performed passive hamstring stretch 30 sec hold  Progressed to ankle DF/PF neural stretch x10 reps  Hooklying: Green tband hip abduction/ER 2 x10 reps;  Gluteal bridge with GTB in ABD x15 reps Posterior pelvic tilt 10x 3-5 second holds  Sidelying:  Green tband clamshell 2x10 reps;  -abduction with 5lb ankle weight 2 x 5 reps    Qped: Hip extension 5# 2x10 reps L LE only; cues for slow and controlled movement  Hip abduction fire hydrant 5# 2 x10 reps L LE only Gluteal hip extension (knee flexed) 5# x10 reps each LE   Sitting:  Piriformis stretch 30 sec hold x2 reps each LE;   Standing walking/hiking warm up: lateral walks with GTB x 4 laps ( simulaitng length of parking space)  Monster walks ( forward stepping with hip ABD GTB)  Standing hip extension / abduction ( 45 degrees posterior hip movement) x 10 L LE  only       Pt educated throughout session about proper posture and technique with exercises. Improved exercise technique, movement at target joints, use of target muscles after min to mod verbal, visual, tactile cues. Rationale for Evaluation and Treatment Rehabilitation                                  PT Education - 12/03/21 1303     Education Details walking warm up for gluteal activation and stability    Person(s) Educated Patient    Methods Explanation;Demonstration    Comprehension Verbalized understanding;Returned demonstration              PT Short Term Goals - 11/10/21 1229       PT SHORT TERM GOAL #1   Title HEP independence and compliance.    Baseline Issued at Eval:    Time 3    Period Weeks    Status New    Target Date 12/01/21      PT SHORT TERM GOAL #2   Title Ability to complete full dog walk without aggravation of symptoms.    Baseline Eval: begins halfway through dog walk    Time 4    Period Weeks    Status New    Target Date 12/08/21      PT SHORT TERM GOAL #3   Title 5/5 MMT Left hip ABDCT and seated hip IR without pain.    Baseline Eval: 4+/5 ABDCT, IR painful    Time 4    Period Weeks    Status New    Target Date 12/08/21               PT Long Term Goals - 11/10/21 1233       PT LONG TERM GOAL #1   Title Pt to report return to fitness walking hiking at 50-75% of prior or desired distance without aggravation of pain next day.    Baseline Eval: no exercise;    Time 6    Period Weeks    Status New    Target Date 12/22/21      PT LONG TERM GOAL #2   Title 5/5 MMT bilat hip extension, ABDCT, IR without pain.    Baseline Eval: *see note    Time 7    Period Weeks    Status New    Target Date 12/29/21      PT LONG TERM GOAL #3   Title  FOTO score increase >15 points.    Baseline Eval: not yet increased    Time 8    Period Weeks    Status New    Target Date 01/05/22                    Plan - 12/03/21 0934     Clinical Impression Statement Pt is pleasant and will dissipate as well within physical therapy session.  With upper exercises were modified secondary to significant pain discomfort following previous session.  All these medications are displayed in intervention section of this note.  Patient was introduced to dynamic Warm up activity in order to improve hip muscle activation prior to try walking when she attempts to this activity over the weekend.  Patient will continue to benefit from skilled physical therapy in order to improve her levels of pain, and overall function.    Personal Factors and Comorbidities Age;Fitness;Time since onset of injury/illness/exacerbation;Behavior Pattern    Examination-Activity Limitations Bed Mobility;Locomotion Level;Stairs;Sit    Examination-Participation Restrictions Community Activity    Stability/Clinical Decision Making Stable/Uncomplicated    Rehab Potential Good    PT Frequency 2x / week    PT Duration 8 weeks    PT Treatment/Interventions Gait training;Stair training;Functional mobility training;Moist Heat;ADLs/Self Care Home Management;Cryotherapy;Electrical Stimulation;Therapeutic exercise;Balance training;Patient/family education;Dry needling;Passive range of motion    PT Next Visit Plan Review HEP assignments, repeat glute medius release    PT Home Exercise Plan Sidelying hip ABDCT from 0 degrees, seated hip IR c GreenTB resistance adn spacer for open hip position; Access Code: [email or web address]  URL: [email or web address]  Date: 11/09/2021  Prepared by: Rebbeca Paul    Exercises  - Sidelying Hip Abduction  - 1 x daily - 7 x weekly - 2 sets - 10 reps - 3 hold  - Seated Hip Internal Rotation with Ball and Resistance  - 1 x daily - 7 x weekly - 3 sets - 10 reps - 5 hold    Consulted and Agree with Plan of Care Patient             Patient will benefit from skilled therapeutic intervention in order to improve the following  deficits and impairments:  Abnormal gait, Decreased activity tolerance, Decreased balance, Decreased range of motion, Decreased knowledge of precautions, Decreased knowledge of use of DME, Decreased mobility, Decreased strength  Visit Diagnosis: Pain in left hip  Difficulty in walking, not elsewhere classified     Problem List Patient Active Problem List   Diagnosis Date Noted   Estrogen deficiency 11/09/2021   Left hip pain 10/26/2021   Hip bursitis, left 07/22/2021   Feeling of chest tightness 05/26/2021   Stress reaction 05/26/2021   Environmental and seasonal allergies 05/12/2021   Mild intermittent asthma without complication 94/85/4627   Hyperlipidemia 10/21/2020   Routine general medical examination at a health care facility 10/13/2020   Vitamin D deficiency 10/13/2020   Fracture of distal fibula 02/26/2020   Smoker 01/18/2020   Irritable bowel syndrome with constipation 04/05/2019    Particia Lather, PT 12/03/2021, 1:06 PM  [facility] [address] [place], [place], [address] Phone: [phone]   Fax:  [phone]  Name: Destiny Harrell MRN: [id number] Date of Birth: 1961-03-29

## 2021-12-08 ENCOUNTER — Ambulatory Visit: Payer: PRIVATE HEALTH INSURANCE

## 2021-12-08 DIAGNOSIS — M25552 Pain in left hip: Secondary | ICD-10-CM

## 2021-12-08 DIAGNOSIS — R262 Difficulty in walking, not elsewhere classified: Secondary | ICD-10-CM

## 2021-12-08 NOTE — Therapy (Signed)
OUTPATIENT PHYSICAL THERAPY TREATMENT NOTE   Patient Name: Destiny Harrell MRN: [id number] DOB:11-Sep-1960, 61 y.o., female Today's Date: 12/08/2021  PCP: Abner Greenspan MD REFERRING PROVIDER: Abner Greenspan MD    PT End of Session - 12/08/21 1424     Visit Number 7    Number of Visits 16    Date for PT Re-Evaluation 01/04/22    Authorization Type [employer]    Authorization Time Period 11/09/21-01/04/22    Authorization - Number of Visits 4    Progress Note Due on Visit 10    PT Start Time 9323    PT Stop Time 1429    PT Time Calculation (min) 44 min    Activity Tolerance Patient tolerated treatment well;No increased pain    Behavior During Therapy WFL for tasks assessed/performed             Past Medical History:  Diagnosis Date   Breast cancer ([place])    Depression    Hyperlipemia    IBS (irritable bowel syndrome)    Migraines    Personal history of radiation therapy    Past Surgical History:  Procedure Laterality Date   BREAST BIOPSY Left 08/16/2012   positive    BREAST LUMPECTOMY Left 08/31/2012   BREAST LUMPECTOMY WITH NEEDLE LOCALIZATION AND AXILLARY SENTINEL LYMPH NODE BX Left 08/31/2012   Procedure: left BREAST needle localized  LUMPECTOMY WITH left sentinel lymph node mapping ;  Surgeon: Marcello Moores A. Cornett, MD;  Location: [facility];  Service: General;  Laterality: Left;  needle localization at breast center of [place] 7:30    BREAST SURGERY  08/31/12   ER+PR-HER-2neu-   [place]  2010   COLONOSCOPY  04/24/2019   OOPHORECTOMY  Right 1999, Left 2001   POLYPECTOMY     Patient Active Problem List   Diagnosis Date Noted   Estrogen deficiency 11/09/2021   Left hip pain 10/26/2021   Hip bursitis, left 07/22/2021   Feeling of chest tightness 05/26/2021   Stress reaction 05/26/2021   Environmental and seasonal allergies 05/12/2021   Mild intermittent asthma without complication 55/73/2202    Hyperlipidemia 10/21/2020   Routine general medical examination at a health care facility 10/13/2020   Vitamin D deficiency 10/13/2020   Fracture of distal fibula 02/26/2020   Smoker 01/18/2020   Irritable bowel syndrome with constipation 04/05/2019    REFERRING DIAG: L hip bursitis   THERAPY DIAG:  Pain in left hip  Difficulty in walking, not elsewhere classified  Rationale for Evaluation and Treatment Rehabilitation  PERTINENT HISTORY: Pt diagnosed with Left hip bursitis in January and received and injection with partial good relief. Pt still has pain and achiness in that area. She reports about 4-6 months of left pain prior to formal diagnosis. Pt has most pain while sleeping at night, a side sleeper, forced to sleep on left side due to snoring issues and Rt shoulder pain. Pt inititally may have overloaded hip during a trip to [place] and daily hikes there in November 2022. Pt has not been able to do weekend [facility], but still walks her dog around the block  PRECAUTIONS: none  SUBJECTIVE: Patient reports she was really hurting after one session, felt better after stretching. Went for a longer walk during the weekend and that was ok. Sunday had a hard time sleeping. Patient reports she needs to get in a routine   PAIN:  Are you having pain? Yes:  NPRS scale: 3/10 Pain location: L glute Pain description: ache Aggravating factors: seated, not moving Relieving factors: stretching    TODAY'S TREATMENT:  12/08/21 Manual: Distraction with belt L hip : inferior glide 3x30 seconds; figure 4 inferior SAD 3x30 seconds  TherEx: Open book stretch 30 seconds each side x 2 trials  Hip flexion in long sit over hedgehog 10x  Table top 90 90 leg drop with core activation 10x each LE Posterior pelvic tilt with adduction ball squeeze and bridge 10x   Trigger Point Dry Needling (TDN), unbilled Education performed with patient regarding potential benefit of TDN. Reviewed precautions and  risks with patient. Reviewed special precautions/risks over lung fields which include pneumothorax. Reviewed signs and symptoms of pneumothorax and advised pt to go to ER immediately if these symptoms develop advise them of dry needling treatment. Extensive time spent with pt to ensure full understanding of TDN risks. Pt provided verbal consent to treatment. TDN performed to  with 0.3 x 60 and .75 single needle placements with local twitch response (LTR). Pistoning technique utilized. Improved pain-free motion following intervention. L glute and TFL targeted x 3 minutes    PATIENT EDUCATION: Education details: TDN, core activation, distraction Person educated: Patient Education method: Explanation, Demonstration, Tactile cues, and Verbal cues Education comprehension: verbalized understanding, returned demonstration, verbal cues required, and tactile cues required   HOME EXERCISE PROGRAM: Give new program next session    PT Short Term Goals - 11/10/21 1229       PT SHORT TERM GOAL #1   Title HEP independence and compliance.    Baseline Issued at Eval:    Time 3    Period Weeks    Status New    Target Date 12/01/21      PT SHORT TERM GOAL #2   Title Ability to complete full dog walk without aggravation of symptoms.    Baseline Eval: begins halfway through dog walk    Time 4    Period Weeks    Status New    Target Date 12/08/21      PT SHORT TERM GOAL #3   Title 5/5 MMT Left hip ABDCT and seated hip IR without pain.    Baseline Eval: 4+/5 ABDCT, IR painful    Time 4    Period Weeks    Status New    Target Date 12/08/21              PT Long Term Goals - 11/10/21 1233       PT LONG TERM GOAL #1   Title Pt to report return to fitness walking hiking at 50-75% of prior or desired distance without aggravation of pain next day.    Baseline Eval: no exercise;    Time 6    Period Weeks    Status New    Target Date 12/22/21      PT LONG TERM GOAL #2   Title 5/5 MMT bilat  hip extension, ABDCT, IR without pain.    Baseline Eval: *see note    Time 7    Period Weeks    Status New    Target Date 12/29/21      PT LONG TERM GOAL #3   Title FOTO score increase >15 points.    Baseline Eval: not yet increased    Time 8    Period Weeks    Status New    Target Date 01/05/22  Plan - 12/08/21 1537     Clinical Impression Statement Patient will require one last session for discharge. Will perform TDN, goals, and HEP education next session. Patient tolerates TDN well with multiple trigger points released. Core activation technique tolerated well. Education on importance of compliance and habit formation. Patient will continue to benefit from skilled physical therapy in order to improve her levels of pain, and overall function    Personal Factors and Comorbidities Age;Fitness;Time since onset of injury/illness/exacerbation;Behavior Pattern    Examination-Activity Limitations Bed Mobility;Locomotion Level;Stairs;Sit    Examination-Participation Restrictions Community Activity    Stability/Clinical Decision Making Stable/Uncomplicated    Rehab Potential Good    PT Frequency 2x / week    PT Duration 8 weeks    PT Treatment/Interventions Gait training;Stair training;Functional mobility training;Moist Heat;ADLs/Self Care Home Management;Cryotherapy;Electrical Stimulation;Therapeutic exercise;Balance training;Patient/family education;Dry needling;Passive range of motion    PT Next Visit Plan discharge, give new HEP:    PT Home Exercise Plan Sidelying hip ABDCT from 0 degrees, seated hip IR c GreenTB resistance adn spacer for open hip position; Access Code: [email or web address]  URL: [email or web address]  Date: 11/09/2021  Prepared by: Rebbeca Paul    Exercises  - Sidelying Hip Abduction  - 1 x daily - 7 x weekly - 2 sets - 10 reps - 3 hold  - Seated Hip Internal Rotation with Ball and Resistance  - 1 x daily - 7 x weekly - 3 sets - 10 reps - 5 hold     Consulted and Agree with Plan of Care Patient              Janna Arch, PT, DPT  12/08/2021, 3:39 PM

## 2021-12-10 ENCOUNTER — Ambulatory Visit: Payer: PRIVATE HEALTH INSURANCE

## 2021-12-10 DIAGNOSIS — R262 Difficulty in walking, not elsewhere classified: Secondary | ICD-10-CM

## 2021-12-10 DIAGNOSIS — M25552 Pain in left hip: Secondary | ICD-10-CM | POA: Diagnosis not present

## 2021-12-10 NOTE — Therapy (Signed)
OUTPATIENT PHYSICAL THERAPY TREATMENT NOTE   Patient Name: Destiny Harrell MRN: [id number] DOB:February 08, 1961, 61 y.o., female Today's Date: 12/10/2021  PCP: Abner Greenspan MD REFERRING PROVIDER: Abner Greenspan MD    PT End of Session - 12/10/21 1350     Visit Number 8    Number of Visits 16    Date for PT Re-Evaluation 01/04/22    Authorization Type [employer]    Authorization Time Period 11/09/21-01/04/22    Authorization - Number of Visits 4    Progress Note Due on Visit 10    PT Start Time 1350    PT Stop Time 1430    PT Time Calculation (min) 40 min    Activity Tolerance Patient tolerated treatment well;No increased pain    Behavior During Therapy WFL for tasks assessed/performed             Past Medical History:  Diagnosis Date   Breast cancer ([place])    Depression    Hyperlipemia    IBS (irritable bowel syndrome)    Migraines    Personal history of radiation therapy    Past Surgical History:  Procedure Laterality Date   BREAST BIOPSY Left 08/16/2012   positive    BREAST LUMPECTOMY Left 08/31/2012   BREAST LUMPECTOMY WITH NEEDLE LOCALIZATION AND AXILLARY SENTINEL LYMPH NODE BX Left 08/31/2012   Procedure: left BREAST needle localized  LUMPECTOMY WITH left sentinel lymph node mapping ;  Surgeon: Marcello Moores A. Cornett, MD;  Location: [facility];  Service: General;  Laterality: Left;  needle localization at breast center of [place] 7:30    BREAST SURGERY  08/31/12   [id number]   [place]  2010   COLONOSCOPY  04/24/2019   OOPHORECTOMY  Right 1999, Left 2001   POLYPECTOMY     Patient Active Problem List   Diagnosis Date Noted   Estrogen deficiency 11/09/2021   Left hip pain 10/26/2021   Hip bursitis, left 07/22/2021   Feeling of chest tightness 05/26/2021   Stress reaction 05/26/2021   Environmental and seasonal allergies 05/12/2021   Mild intermittent asthma without complication 37/85/8850    Hyperlipidemia 10/21/2020   Routine general medical examination at a health care facility 10/13/2020   Vitamin D deficiency 10/13/2020   Fracture of distal fibula 02/26/2020   Smoker 01/18/2020   Irritable bowel syndrome with constipation 04/05/2019    REFERRING DIAG: L hip bursitis  THERAPY DIAG:  Pain in left hip  Difficulty in walking, not elsewhere classified  Rationale for Evaluation and Treatment Rehabilitation  PERTINENT HISTORY: Pt diagnosed with Left hip bursitis in January and received and injection with partial good relief. Pt still has pain and achiness in that area. She reports about 4-6 months of left pain prior to formal diagnosis. Pt has most pain while sleeping at night, a side sleeper, forced to sleep on left side due to snoring issues and Rt shoulder pain. Pt inititally may have overloaded hip during a trip to [place] and daily hikes there in November 2022. Pt has not been able to do weekend [facility], but still walks her dog around the block.   PRECAUTIONS:n/a  SUBJECTIVE: Patient reports this morning her pain was a 3/10 and was so sore. Walking didn't aggrevate it.   PAIN:  Are you having pain? Yes: NPRS scale: 1/10 Pain location: L hip  Pain description: aching Aggravating factors: not moving Relieving factors: stretching     TODAY'S TREATMENT:  Goals Review HEP  Trigger Point Dry Needling (TDN), unbilled Education performed with patient regarding potential benefit of TDN. Reviewed precautions and risks with patient. Reviewed special precautions/risks over lung fields which include pneumothorax. Reviewed signs and symptoms of pneumothorax and advised pt to go to ER immediately if these symptoms develop advise them of dry needling treatment. Extensive time spent with pt to ensure full understanding of TDN risks. Pt provided verbal consent to treatment. TDN performed to  with 0.3 x 60 and .75 single needle placements with local twitch response (LTR).  Pistoning technique utilized. Improved pain-free motion following intervention. L glute and TFL targeted x 3 minutes    PATIENT EDUCATION: Education details: discharge, HEP, TDN Person educated: Patient Education method: Explanation, Demonstration, Tactile cues, and Verbal cues Education comprehension: verbalized understanding, returned demonstration, verbal cues required, and tactile cues required   HOME EXERCISE PROGRAM: Access Code: [email or web address] URL: [email or web address] Date: 12/10/2021 Prepared by: Janna Arch  Exercises - Clamshell  - 1 x daily - 7 x weekly - 2 sets - 10 reps - 5 hold - Supine Lower Trunk Rotation  - 1 x daily - 7 x weekly - 2 sets - 10 reps - 5 hold - Supine Posterior Pelvic Tilt  - 1 x daily - 7 x weekly - 2 sets - 10 reps - 5 hold - Supine Piriformis Stretch  - 1 x daily - 7 x weekly - 2 sets - 2 reps - 30 hold - Seated Piriformis Stretch with Trunk Bend  - 1 x daily - 7 x weekly - 2 sets - 2 reps - 30 hold - Seated Hamstring Stretch  - 1 x daily - 7 x weekly - 2 sets - 2 reps - 30 hold - Supine 90/90 Abdominal Bracing  - 1 x daily - 7 x weekly - 2 sets - 10 reps - 5 hold - Bird Dog  - 1 x daily - 7 x weekly - 2 sets - 10 reps - 5 hold   PT Short Term Goals - 11/10/21 1229       PT SHORT TERM GOAL #1   Title HEP independence and compliance.    Baseline 6/22: HEP compliant   Time 3    Period Weeks    Status achieved   Target Date 12/01/21      PT SHORT TERM GOAL #2   Title Ability to complete full dog walk without aggravation of symptoms.    Baseline Eval: begins halfway through dog walk 6/22: yes    Time 4    Period Weeks    Status Achieved   Target Date 12/08/21      PT SHORT TERM GOAL #3   Title 5/5 MMT Left hip ABDCT and seated hip IR without pain.    Baseline Eval: 4+/5 ABDCT, IR painful 6/22: 5/5   Time 4    Period Weeks    Status Achieved   Target Date 12/08/21              PT Long Term Goals - 11/10/21 1233        PT LONG TERM GOAL #1   Title Pt to report return to fitness walking hiking at 50-75% of prior or desired distance without aggravation of pain next day.    Baseline Eval: no exercise;  6/22: hasn't had a chance to hike due weather but has done shorter hike; has been doing longer dog walks   Time 6    Period Weeks  Status Partial Met   Target Date 12/22/21      PT LONG TERM GOAL #2   Title 5/5 MMT bilat hip extension, ABDCT, IR without pain.    Baseline Eval: *see note  6/22: 5/5   Time 7    Period Weeks    Status Achieved   Target Date 12/29/21      PT LONG TERM GOAL #3   Title FOTO score increase >15 points.    Baseline Eval: not yet increased 6/22: 87%   Time 8    Period Weeks    Status Achieved   Target Date 01/05/22              Plan     Clinical Impression Statement Patient educated on HEP demonstrating understanding due to today being patient's last day. Goals performed and patient is now at functional level to self control pain levels . TDN performed with multiple large trigger points released. I will be happy to see this patient again in the future as needed.    Personal Factors and Comorbidities Age;Fitness;Time since onset of injury/illness/exacerbation;Behavior Pattern    Examination-Activity Limitations Bed Mobility;Locomotion Level;Stairs;Sit    Examination-Participation Restrictions Community Activity    Stability/Clinical Decision Making Stable/Uncomplicated    Rehab Potential Good    PT Frequency 2x / week    PT Duration 8 weeks    PT Treatment/Interventions Gait training;Stair training;Functional mobility training;Moist Heat;ADLs/Self Care Home Management;Cryotherapy;Electrical Stimulation;Therapeutic exercise;Balance training;Patient/family education;Dry needling;Passive range of motion    PT Home Exercise Plan Sidelying hip ABDCT from 0 degrees, seated hip IR c GreenTB resistance adn spacer for open hip position; Access Code: [email or web address]  URL:  [email or web address]  Date: 11/09/2021  Prepared by: Rebbeca Paul    Exercises  - Sidelying Hip Abduction  - 1 x daily - 7 x weekly - 2 sets - 10 reps - 3 hold  - Seated Hip Internal Rotation with Ball and Resistance  - 1 x daily - 7 x weekly - 3 sets - 10 reps - 5 hold    Consulted and Agree with Plan of Care Patient               Janna Arch, PT, DPT  12/10/2021, 2:33 PM

## 2021-12-14 ENCOUNTER — Ambulatory Visit: Payer: PRIVATE HEALTH INSURANCE | Admitting: Physical Therapy

## 2021-12-29 ENCOUNTER — Encounter: Payer: No Typology Code available for payment source | Admitting: Physical Therapy

## 2021-12-31 ENCOUNTER — Encounter: Payer: No Typology Code available for payment source | Admitting: Physical Therapy

## 2022-01-05 ENCOUNTER — Encounter: Payer: No Typology Code available for payment source | Admitting: Physical Therapy

## 2022-01-07 ENCOUNTER — Encounter: Payer: No Typology Code available for payment source | Admitting: Physical Therapy

## 2022-02-24 ENCOUNTER — Encounter: Payer: Self-pay | Admitting: Family Medicine

## 2022-02-24 ENCOUNTER — Ambulatory Visit: Payer: No Typology Code available for payment source | Admitting: Family Medicine

## 2022-02-24 VITALS — BP 100/60 | HR 79 | Temp 98.3°F | Ht 62.25 in | Wt 163.0 lb

## 2022-02-24 DIAGNOSIS — R29898 Other symptoms and signs involving the musculoskeletal system: Secondary | ICD-10-CM

## 2022-02-24 DIAGNOSIS — M7062 Trochanteric bursitis, left hip: Secondary | ICD-10-CM

## 2022-02-24 DIAGNOSIS — M7061 Trochanteric bursitis, right hip: Secondary | ICD-10-CM

## 2022-02-24 MED ORDER — TRIAMCINOLONE ACETONIDE 40 MG/ML IJ SUSP
40.0000 mg | Freq: Once | INTRAMUSCULAR | Status: AC
  Administered 2022-02-24: 40 mg via INTRA_ARTICULAR

## 2022-02-24 NOTE — Progress Notes (Signed)
Destiny Sabatino T. Rahcel Shutes, MD, [place] at [facility] [place], [address]  Phone: [phone]  FAX: [phone]  Destiny Harrell - 61 y.o. female  MRN [id number]  Date of Birth: 17-Jun-1961  Date: 02/24/2022  PCP: Abner Greenspan, MD  Referral: Abner Greenspan, MD  Chief Complaint  Patient presents with   Leg Pain    Left Thigh x 1 year   Subjective:   Destiny Harrell is a 61 y.o. very pleasant female patient with Body mass index is 29.57 kg/m. who presents with the following:  Is a very pleasant female patient who I recall seeing in the past roughly 6 months ago with some left-sided trochanteric bursitis.  She has been having left greater than right lateral thigh pain now for approaching a year.  At times this has improved, but other times it is gotten quite a bit of worse.  In January, it was particularly bad she had a hard time walking.  In May 2023 she did do a round of physical therapy, and this did seem to help somewhat.  She is still plagued with persistent symptoms and she has an upcoming trip to [place], where she will see her son and grandchildren.  Review of Systems is noted in the HPI, as appropriate  Objective:   BP 100/60   Pulse 79   Temp 98.3 F (36.8 C) (Oral)   Ht 5' 2.25" (1.581 m)   Wt 163 lb (73.9 kg)   LMP 06/22/1999 (Approximate)   SpO2 97%   BMI 29.57 kg/m   GEN: No acute distress; alert,appropriate. PULM: Breathing comfortably in no respiratory distress PSYCH: Normally interactive.   HIP EXAM: SIDE: B ROM: Abduction, Flexion, Internal and External range of motion: full, excellent ROM Pain with terminal IROM and EROM: no GTB: TTP both bad with L > R SLR: NEG Knees: No effusion FABER: NT REVERSE FABER: NT, neg Piriformis: NT at direct palpation Str: flexion: 4/5 abduction: 4-/5 on the L, 4+ on the R adduction: 4/5 Strength testing mildly tender    Laboratory and Imaging  Data:  Assessment and Plan:     ICD-10-CM   1. Trochanteric bursitis of both hips  M70.61 triamcinolone acetonide (KENALOG-40) injection 40 mg   M70.62 triamcinolone acetonide (KENALOG-40) injection 40 mg    2. Weakness of left hip  R29.898      Persistent trochanteric bursitis lasting greater than 9 months with some associated weakness, worse in the left hip.  I gave her a dedicated rehab program to work on daily, and I think that this will be the most likely support to help her get back to her baseline and to decrease risk of recurrence.  I would be optimistic that she can have a good prognosis overall.  Aspiration/Injection Procedure Note [name] [birth year] Date of procedure: 02/24/2022  Procedure: Large Joint Aspiration / Injection of Hip, Trochanteric Bursa, R Indications: Pain  Procedure Details Verbal consent obtained. Risks, benefits, and alternatives reviewed. Greater trochanter sterilely prepped with Chloraprep. Ethyl Chloride used for anesthesia. 9 cc of Lidocaine 1% injected with 1 mL of Kenalog 40 mg into trochanteric bursa at area of maximal tenderness at greater trochanter. Needle taken to bone to troch bursa, flows easily. Bursa massaged. No bleeding and no complications. Decreased pain after injection. Needle: 22 gauge spinal needle Medication: 1 mL of Kenalog 40 mg   Aspiration/Injection Procedure Note [name] 12-14-1960 Date of procedure:  02/24/2022  Procedure: Large Joint Aspiration / Injection of Hip, Trochanteric Bursa, L Indications: Pain  Procedure Details Verbal consent obtained. Risks, benefits, and alternatives reviewed. Greater trochanter sterilely prepped with Chloraprep. Ethyl Chloride used for anesthesia. 9 cc of Lidocaine 1% injected with 1 mL of Kenalog 40 mg into trochanteric bursa at area of maximal tenderness at greater trochanter. Needle taken to bone to troch bursa, flows easily. Bursa massaged. No bleeding and no  complications. Decreased pain after injection. Needle: 22 gauge spinal needle Medication: 1 mL of Kenalog 40 mg   Medication Management during today's office visit: Meds ordered this encounter  Medications   triamcinolone acetonide (KENALOG-40) injection 40 mg   triamcinolone acetonide (KENALOG-40) injection 40 mg   There are no discontinued medications.  Orders placed today for conditions managed today: No orders of the defined types were placed in this encounter.   Disposition: No follow-ups on file.  Dragon [employer] One speech-to-text software was used for transcription in this dictation.  Possible transcriptional errors can occur using Editor, commissioning.   Signed,  Maud Deed. Aviraj Kentner, MD   Outpatient Encounter Medications as of 02/24/2022  Medication Sig   albuterol (VENTOLIN HFA) 108 (90 Base) MCG/ACT inhaler Inhale 2 puffs into the lungs every 4 (four) hours as needed for shortness of breath.   cholecalciferol (VITAMIN D3) 25 MCG (1000 UNIT) tablet Take 1,000 Units by mouth daily. 2 tabs per day   Multiple Vitamins-Minerals (MULTIVITAMIN WITH MINERALS) tablet Take 1 tablet by mouth daily.   rosuvastatin (CRESTOR) 5 MG tablet TAKE 1 TABLET DAILY   meloxicam (MOBIC) 15 MG tablet TAKE 1 TABLET (15 MG TOTAL) BY MOUTH DAILY AS NEEDED FOR PAIN. WITH FOOD (Patient not taking: Reported on 02/24/2022)   [EXPIRED] triamcinolone acetonide (KENALOG-40) injection 40 mg    [EXPIRED] triamcinolone acetonide (KENALOG-40) injection 40 mg    No facility-administered encounter medications on file as of 02/24/2022.

## 2022-03-27 ENCOUNTER — Other Ambulatory Visit: Payer: Self-pay | Admitting: Family Medicine

## 2022-03-27 DIAGNOSIS — J4520 Mild intermittent asthma, uncomplicated: Secondary | ICD-10-CM

## 2022-03-27 DIAGNOSIS — J3089 Other allergic rhinitis: Secondary | ICD-10-CM

## 2022-04-28 ENCOUNTER — Encounter: Payer: Self-pay | Admitting: Gastroenterology

## 2022-06-06 IMAGING — DX DG CHEST 2V
2 series · 2 of 2 positions shown · non-contrast
Comparison: 06/03/2014

CLINICAL DATA: Right chest wall pain

EXAM:
CHEST - 2 VIEW

[chest pa]
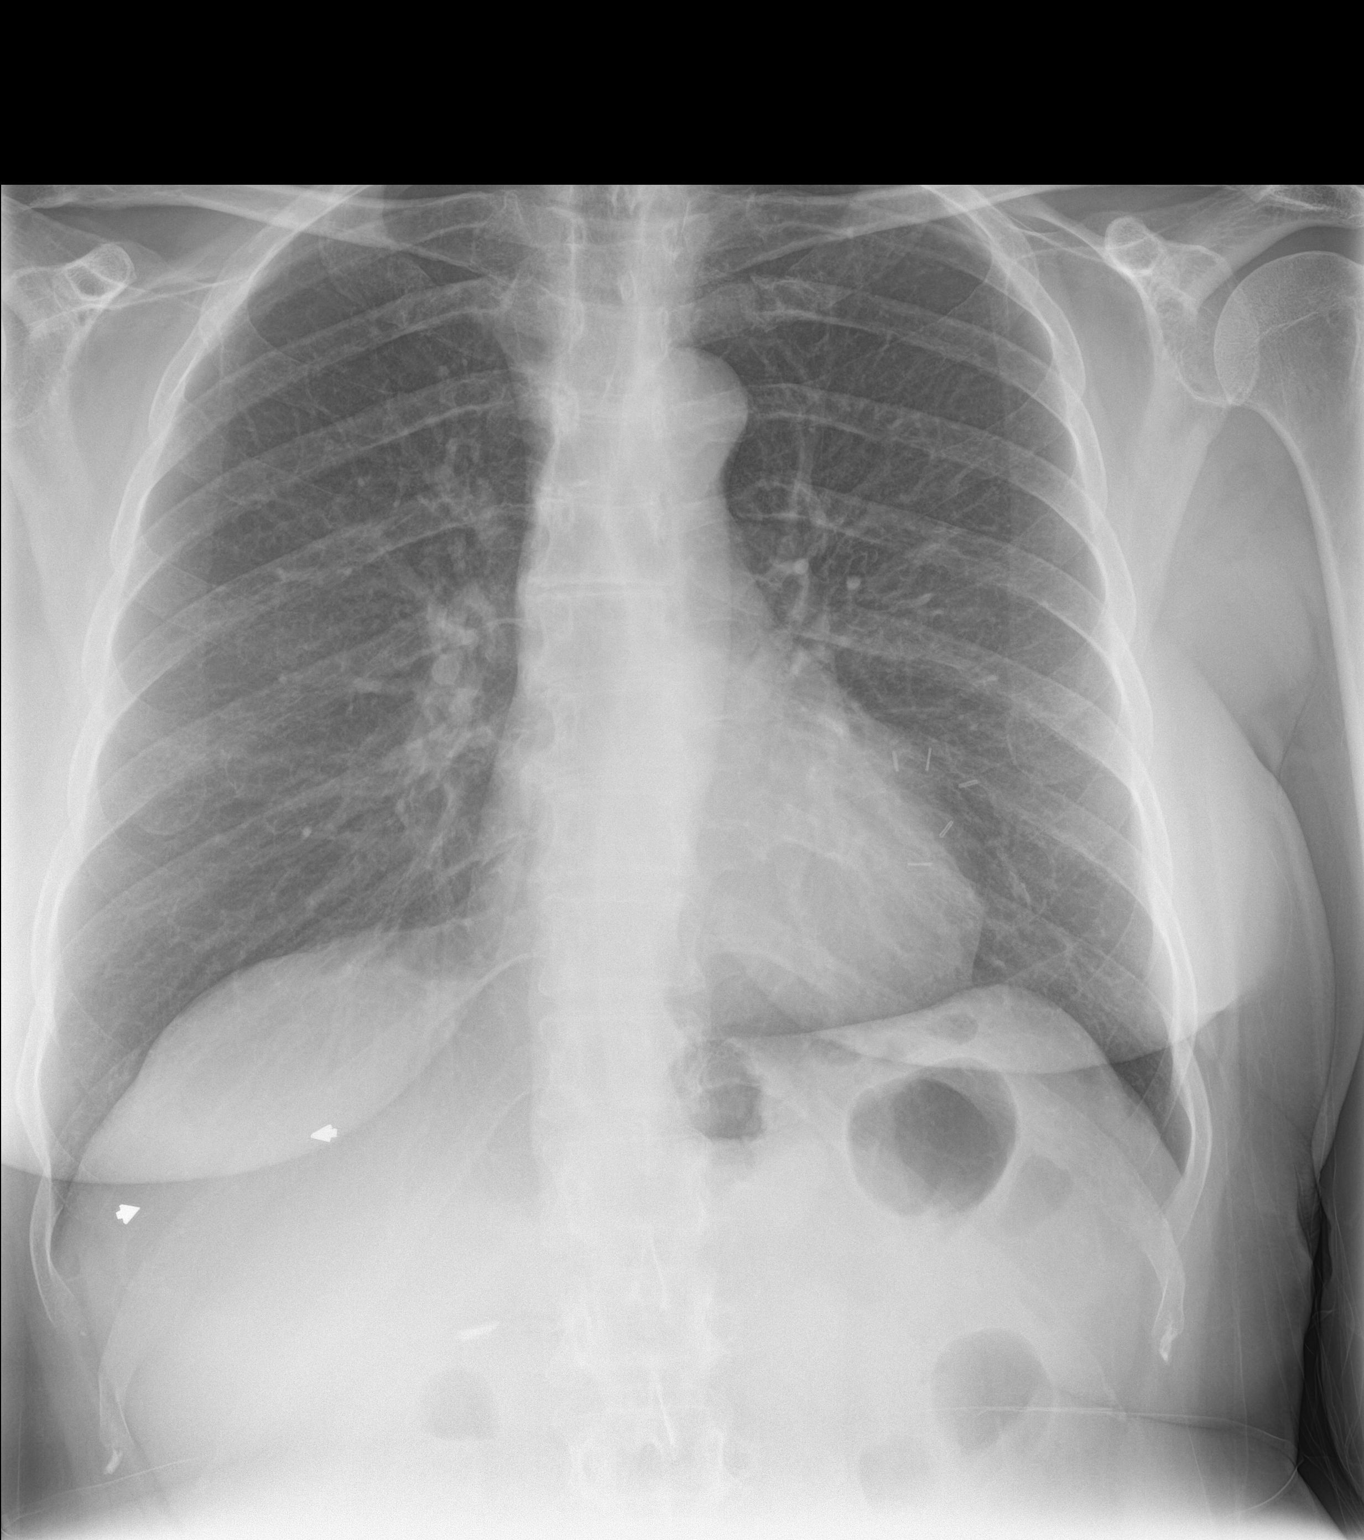

[chest lat]
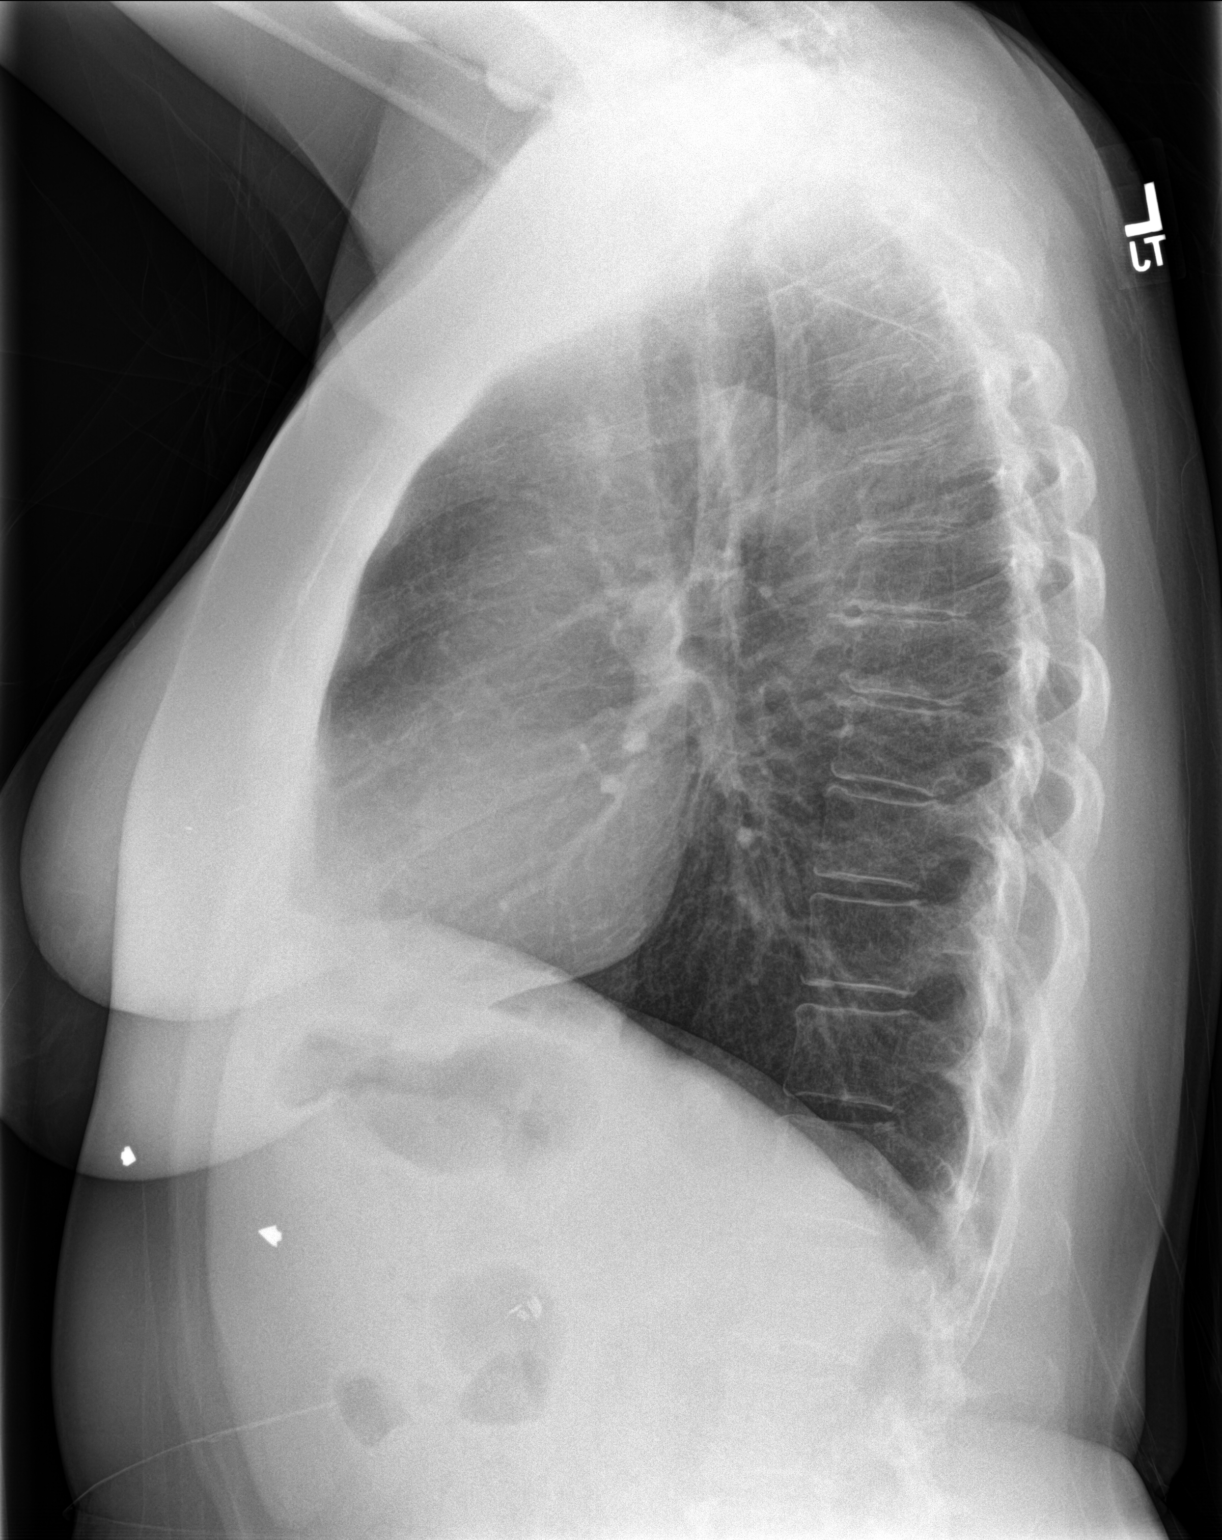

[2 of 2 positions shown; findings below may reference images not displayed]

FINDINGS: The heart size and mediastinal contours are within normal limits.
Both lungs are clear. The visualized skeletal structures are
unremarkable. Clips over the left breast.
IMPRESSION: No active cardiopulmonary disease.

## 2022-07-11 NOTE — Progress Notes (Signed)
Destiny Harrell T. Destiny Ozer, MD, [place] at [facility] [place], [address]  Phone: [phone]  FAX: [phone]  Destiny Harrell - 62 y.o. female  MRN [id number]  Date of Birth: [birth year]  Date: 07/12/2022  PCP: Destiny Greenspan, MD  Referral: Destiny Greenspan, MD  Chief Complaint  Patient presents with   Hip Pain    Bilateral but left is worse   Subjective:   Destiny Harrell is a 62 y.o. very pleasant female patient with Body mass index is 29.78 kg/m. who presents with the following:  She is a very pleasant patient, and I recall her from prior office visits.  I saw her in September 2023 for some bilateral trochanteric bursitis.  At that point I did do bilateral trochanteric bursa injections.  She presents today with some persistent symptoms and bilateral lateral hip pain, but really it is her left side that is bothering her quite a bit..  Did PT already with her hips.  Walking 10,000 steps a day.  Sometimes only 8000, but she is general I quite active.  She is continued to do some strengthening and flexibility work, as well.  She is traveling to [place], and her 36 year old son does have ongoing treatment for a sarcoma.  Review of Systems is noted in the HPI, as appropriate  Objective:   BP 100/70   Pulse 71   Temp 98.2 F (36.8 C) (Oral)   Ht 5' 2.25" (1.581 m)   Wt 164 lb 2 oz (74.4 kg)   LMP 06/22/1999 (Approximate)   SpO2 99%   BMI 29.78 kg/m   GEN: No acute distress; alert,appropriate. PULM: Breathing comfortably in no respiratory distress PSYCH: Normally interactive.   Full range of motion of both hips.  Strength is 5/5, she is otherwise neurovascularly intact. Left trochanteric bursa is quite tender to palpation.  Laboratory and Imaging Data:  Assessment and Plan:     ICD-10-CM   1. Trochanteric bursitis, left hip  M70.62 triamcinolone acetonide (KENALOG-40) injection 40 mg    2.  Trochanteric bursitis of both hips  M70.61    M70.62      Predominant issue is her left trochanteric bursa.  Challenging and refractory case, she has done physical therapy, she is continue to do some stretching and strength of motion.  She still has some persistent symptoms now, left is much greater than the right.  Aspiration/Injection Procedure Note Destiny Harrell 05-02-1961 Date of procedure: 07/12/2022  Procedure: Large Joint Aspiration / Injection of Hip, Trochanteric Bursa, L Indications: Pain  Procedure Details Verbal consent obtained. Risks, benefits, and alternatives reviewed. Greater trochanter sterilely prepped with Chloraprep. Ethyl Chloride used for anesthesia. 9 cc of Lidocaine 1% injected with 1 mL of Kenalog 40 mg into trochanteric bursa at area of maximal tenderness at greater trochanter. Needle taken to bone to troch bursa, flows easily. Bursa massaged. No bleeding and no complications. Decreased pain after injection. Needle: 22 gauge spinal needle Medication: 1 mL of Kenalog 40 mg   Medication Management during today's office visit: Meds ordered this encounter  Medications   triamcinolone acetonide (KENALOG-40) injection 40 mg   Medications Discontinued During This Encounter  Medication Reason   meloxicam (MOBIC) 15 MG tablet Ineffective    Orders placed today for conditions managed today: No orders of the defined types were placed in this encounter.   Disposition: No follow-ups on file.  Dragon [employer] One speech-to-text software was used  for transcription in this dictation.  Possible transcriptional errors can occur using Editor, commissioning.   Signed,  Destiny Harrell. Destiny Minner, MD   Outpatient Encounter Medications as of 07/12/2022  Medication Sig   albuterol (VENTOLIN HFA) 108 (90 Base) MCG/ACT inhaler INHALE 2 PUFFS INTO THE LUNGS EVERY 4 (FOUR) HOURS AS NEEDED FOR SHORTNESS OF BREATH   cholecalciferol (VITAMIN D3) 25 MCG (1000 UNIT) tablet Take 1,000 Units  by mouth daily. 2 tabs per day   Multiple Vitamins-Minerals (MULTIVITAMIN WITH MINERALS) tablet Take 1 tablet by mouth daily.   rosuvastatin (CRESTOR) 5 MG tablet TAKE 1 TABLET DAILY   [DISCONTINUED] meloxicam (MOBIC) 15 MG tablet TAKE 1 TABLET (15 MG TOTAL) BY MOUTH DAILY AS NEEDED FOR PAIN. WITH FOOD (Patient not taking: Reported on 02/24/2022)   [EXPIRED] triamcinolone acetonide (KENALOG-40) injection 40 mg    No facility-administered encounter medications on file as of 07/12/2022.

## 2022-07-12 ENCOUNTER — Ambulatory Visit (INDEPENDENT_AMBULATORY_CARE_PROVIDER_SITE_OTHER): Payer: No Typology Code available for payment source | Admitting: Family Medicine

## 2022-07-12 ENCOUNTER — Encounter: Payer: Self-pay | Admitting: Family Medicine

## 2022-07-12 VITALS — BP 100/70 | HR 71 | Temp 98.2°F | Ht 62.25 in | Wt 164.1 lb

## 2022-07-12 DIAGNOSIS — M7062 Trochanteric bursitis, left hip: Secondary | ICD-10-CM

## 2022-07-12 DIAGNOSIS — M7061 Trochanteric bursitis, right hip: Secondary | ICD-10-CM | POA: Diagnosis not present

## 2022-07-12 MED ORDER — TRIAMCINOLONE ACETONIDE 40 MG/ML IJ SUSP
40.0000 mg | Freq: Once | INTRAMUSCULAR | Status: AC
  Administered 2022-07-12: 40 mg via INTRA_ARTICULAR

## 2022-10-28 ENCOUNTER — Other Ambulatory Visit: Payer: Self-pay | Admitting: Family Medicine

## 2022-10-28 NOTE — Telephone Encounter (Signed)
Called patient she is due for CPE and labs. Patient states she has stopped taking and not able to make appointment at this time. She is on the way out of the country her son is not doing well and will set up visit at later time.

## 2022-10-28 NOTE — Telephone Encounter (Signed)
Ok , sorry to hear that  Will not refill  Will wait until she is back

## 2022-11-25 ENCOUNTER — Telehealth: Payer: Self-pay | Admitting: Family Medicine

## 2022-11-25 DIAGNOSIS — E7800 Pure hypercholesterolemia, unspecified: Secondary | ICD-10-CM

## 2022-11-25 DIAGNOSIS — E559 Vitamin D deficiency, unspecified: Secondary | ICD-10-CM

## 2022-11-25 DIAGNOSIS — Z0000 Encounter for general adult medical examination without abnormal findings: Secondary | ICD-10-CM

## 2022-11-25 NOTE — Telephone Encounter (Signed)
-----   Message from Alvina Chou sent at 11/22/2022 12:51 PM EDT ----- Regarding: Lab orders for Friday, 6.7.24 Patient is scheduled for CPX labs, please order future labs, Thanks , Camelia Eng

## 2022-11-26 ENCOUNTER — Other Ambulatory Visit: Payer: No Typology Code available for payment source

## 2022-11-26 NOTE — Addendum Note (Signed)
Addended by: Alvina Chou on: 11/26/2022 07:18 AM   Modules accepted: Orders

## 2022-12-01 ENCOUNTER — Ambulatory Visit: Payer: No Typology Code available for payment source | Admitting: Family Medicine

## 2022-12-01 ENCOUNTER — Encounter: Payer: Self-pay | Admitting: Family Medicine

## 2022-12-01 VITALS — BP 136/82 | HR 78 | Temp 97.8°F | Ht 62.0 in | Wt 168.1 lb

## 2022-12-01 DIAGNOSIS — F17200 Nicotine dependence, unspecified, uncomplicated: Secondary | ICD-10-CM | POA: Diagnosis not present

## 2022-12-01 DIAGNOSIS — E7800 Pure hypercholesterolemia, unspecified: Secondary | ICD-10-CM

## 2022-12-01 DIAGNOSIS — F430 Acute stress reaction: Secondary | ICD-10-CM

## 2022-12-01 DIAGNOSIS — M21619 Bunion of unspecified foot: Secondary | ICD-10-CM

## 2022-12-01 DIAGNOSIS — Z0000 Encounter for general adult medical examination without abnormal findings: Secondary | ICD-10-CM

## 2022-12-01 DIAGNOSIS — K581 Irritable bowel syndrome with constipation: Secondary | ICD-10-CM

## 2022-12-01 DIAGNOSIS — E559 Vitamin D deficiency, unspecified: Secondary | ICD-10-CM

## 2022-12-01 DIAGNOSIS — Z1231 Encounter for screening mammogram for malignant neoplasm of breast: Secondary | ICD-10-CM | POA: Insufficient documentation

## 2022-12-01 DIAGNOSIS — M7072 Other bursitis of hip, left hip: Secondary | ICD-10-CM

## 2022-12-01 DIAGNOSIS — D126 Benign neoplasm of colon, unspecified: Secondary | ICD-10-CM

## 2022-12-01 DIAGNOSIS — Z853 Personal history of malignant neoplasm of breast: Secondary | ICD-10-CM | POA: Insufficient documentation

## 2022-12-01 DIAGNOSIS — E2839 Other primary ovarian failure: Secondary | ICD-10-CM

## 2022-12-01 NOTE — Assessment & Plan Note (Signed)
Light smoker Has quit several times Disc in detail risks of smoking and possible outcomes including copd, vascular/ heart disease, cancer , respiratory and sinus infections  Pt voices understanding  Not ready to quit currently  Does not qualify for lung cancer screening based on pack years

## 2022-12-01 NOTE — Assessment & Plan Note (Signed)
Ran out/ stopped it  Strongly encouraged to re start for bone and overall health  Level ordered today

## 2022-12-01 NOTE — Assessment & Plan Note (Signed)
Mammogram ordered Pt to call and schedule  Personal h/o breast cancer on left (did not tolerate estrogen blocker and took less than 1 y)

## 2022-12-01 NOTE — Patient Instructions (Addendum)
Go forward with yoga if you can   Ibuprofen-always with food   Keep walking Add some strength training to your routine, this is important for bone and brain health and can reduce your risk of falls and help your body use insulin properly and regulate weight  Light weights, exercise bands , and internet videos are a good way to start  Yoga (chair or regular), machines , floor exercises or a gym with machines are also good options   Get back on the vitamin D  Make a pill box Set reminders   Your colonoscopy will be due by 04/2024 (5 year)   Labs today     You have an order for:  []   2D Mammogram  [x]   3D Mammogram  [x]   Bone Density     Please call for appointment:   []   [facility]  [address] [place] [address]  [phone]  []   [facility])   [address]  [place], [place] [address]  [phone]  [x]   [facility]      [address] [place], [place]        [phone]         []   [facility]  [address] [place], [place]  [phone]  []  [facility]   [address]   [place], [place] [address]  [phone]  []  [facility]  [address] [place], [place] [address] [phone]    Make sure to wear two piece clothing  No lotions powders or deodorants the day of the appointment Make sure to bring picture ID and insurance card.  Bring list of medications you are currently taking including any supplements.   Schedule your screening mammogram through MyChart!   [facility] imaging sites can now be scheduled through MyChart.  Log into your MyChart account.  Go to 'Visit' (or 'Appointments' if  on mobile App) --> Schedule an  Appointment  Under 'Select a Reason for Visit' choose the  Mammogram  Screening option.  Complete the pre-visit questions  and select the time and place that  best fits your schedule

## 2022-12-01 NOTE — Assessment & Plan Note (Signed)
Reviewed health habits including diet and exercise and skin cancer prevention Reviewed appropriate screening tests for age  Also reviewed health mt list, fam hx and immunization status , as well as social and family history   See HPI Labs reviewed and ordered Mammogram ordered (personal h/o breast cancer)-pt to call and schedule Dexa ordered -pt to call and schedule  Pap utd 06/2019 with 5 y repeat  Declines std screening /no need Encouraged strongly to return to vit D and and consider smoking cessation and strength training for bone health  PHQ 0  (high stress level however and declines counseling or treatment)  Labs pending

## 2022-12-01 NOTE — Assessment & Plan Note (Signed)
Ongoing /no clinical change

## 2022-12-01 NOTE — Assessment & Plan Note (Signed)
Both feet - some mild changes in first MTP joints Strongly encouraged to wear wide well fitting shoes

## 2022-12-01 NOTE — Assessment & Plan Note (Signed)
Disc goals for lipids and reasons to control them Rev last labs with pt Rev low sat fat diet in detail  Labs today Was off crestor -unsure if it caused muscle pain/ now back on and tolerating

## 2022-12-01 NOTE — Assessment & Plan Note (Signed)
Son lives in [place] and has sarcoma of leg/lung and brain  He is actually doing better  Pt travels back and forth  Good support Declines counseling or treatment at this time

## 2022-12-01 NOTE — Assessment & Plan Note (Signed)
Years ago Surgery and radiation  Took anti estrogen less than a year/ did not tolerate

## 2022-12-01 NOTE — Assessment & Plan Note (Addendum)
Dexa ordered   Post menopausal, smoker and h/o fracture in the past Also early menopause with ovarian removal in 30s

## 2022-12-01 NOTE — Assessment & Plan Note (Signed)
Had polyp on colonoscopy 04/2019 with 3-5 y recall  Not candidate for cologuard due to polyp  Is very apprehensive to have another colonoscopy due to tortuous colon

## 2022-12-01 NOTE — Progress Notes (Signed)
Subjective:    Patient ID: Destiny Harrell, female    DOB: 03-Dec-1960, 62 y.o.   MRN: [id number]  HPI  Here for health maintenance exam and to review chronic medical problems   Wt Readings from Last 3 Encounters:  12/01/22 168 lb 2 oz (76.3 kg)  07/12/22 164 lb 2 oz (74.4 kg)  02/24/22 163 lb (73.9 kg)   30.75 kg/m  Vitals:   12/01/22 1450  BP: 136/82  Pulse: 78  Temp: 97.8 F (36.6 C)  SpO2: 99%   A really tough year  Son went through cancer (he is in [place])  She visited there for a month   She tends to be achy- especially after inactivity No joint swelling  Is ok once she is up and moving   Still deals with hip bursitis on the L  Makes it hard to sleep 1.5 years and not getting better Had a shot end of January-only 1 d of relief  Did therapy last June -never got better  Wants to start some yoga  Take ibuprofen at night only    Thinks she has bunion on R foot also  Has not worn heels in 6 months    Son was dx with sarcoma -found it in his heel/ankle , and lung , then brain  Did chemo and tolerated well/ surgery and rad  She worries about him a lot      Immunization History  Administered Date(s) Administered   Influenza,inj,Quad PF,6+ Mos 04/04/2019   PFIZER(Purple Top)SARS-COV-2 Vaccination 08/31/2019, 09/25/2019, 06/02/2020, 07/08/2021   Tdap 04/04/2019   Zoster Recombinat (Shingrix) 12/10/2019, 07/08/2021    Health Maintenance Due  Topic Date Due   HIV Screening  Never done   Hepatitis C Screening  Never done   MAMMOGRAM  03/09/2022   Colonoscopy  04/23/2022     Mammogram 02/2021 [facility] at [facility]  Personal h/o breast cancer - thinks she was supposed to be on estrogen blocker for 5 y but she did not tolerate and took less than a year  Self breast exam  Gyn care/ pap  Pap 06/2019 normal with neg HPV    5 y recall  Went through early menopause as well  Both ovaries removed at 27  Was on HRT for years   Some vaginal dryness   Monogamous- no new partner   Colon cancer screening - colonoscopy 04/2019 with 3-5 y recall Had adenomatous polyp     Bone health  Falls -none  Fractures- remote h/o distal fibula fracture   Supplements -h/o vit D def Has not taken her vit D lately -ran out  Exercise : walks dog at least 1.5 mi per day   Smoking status : stopped for a month  Then felt out of control with eating and gained weight -so started again  When busy it is easier   Did note some shortness of breath with uphill hike once Did not happen again  No wheezing      Mood    07/12/2022    8:31 AM 10/26/2021    3:29 PM 10/21/2020   12:12 PM 07/06/2019   10:28 AM 04/04/2019    9:30 AM  Depression screen PHQ 2/9  Decreased Interest 0 0 0 3 0  Down, Depressed, Hopeless 0 0 0 0 0  PHQ - 2 Score 0 0 0 3 0  Altered sleeping   0    Tired, decreased energy   0    Change in appetite  0    Feeling bad or failure about yourself    0    Trouble concentrating   0    Moving slowly or fidgety/restless   0    Suicidal thoughts   0    PHQ-9 Score   0    Difficult doing work/chores   Not difficult at all      Anxious about son and about her own health  Worries a lot  Good support system  Declines counseling      Hyperlipidemia Lab Results  Component Value Date   CHOL 169 11/02/2021   HDL 56.60 11/02/2021   LDLCALC 99 11/02/2021   TRIG 69.0 11/02/2021   CHOLHDL 3 11/02/2021   Due for labs  Takes crestor 5 mg daily Not taking regularly  Held it for a bit to see if it made her achy? Started back 2 weeks ago and feels ok   Diet is fair           Patient Active Problem List   Diagnosis Date Noted   Encounter for screening mammogram for breast cancer 12/01/2022   Personal history of breast cancer 12/01/2022   Adenomatous polyp of colon 12/01/2022   Bunion of great toe 12/01/2022   Estrogen deficiency 11/09/2021   Left hip pain 10/26/2021   Hip bursitis, left 07/22/2021   Stress reaction  05/26/2021   Environmental and seasonal allergies 05/12/2021   Mild intermittent asthma without complication 05/12/2021   Hyperlipidemia 10/21/2020   Routine general medical examination at a health care facility 10/13/2020   Vitamin D deficiency 10/13/2020   History of fibula fracture 02/26/2020   Smoker 01/18/2020   Irritable bowel syndrome with constipation 04/05/2019   Past Medical History:  Diagnosis Date   Breast cancer (HCC)    Depression    Hyperlipemia    IBS (irritable bowel syndrome)    Migraines    Personal history of radiation therapy    Past Surgical History:  Procedure Laterality Date   BREAST BIOPSY Left 08/16/2012   positive    BREAST LUMPECTOMY Left 08/31/2012   BREAST LUMPECTOMY WITH NEEDLE LOCALIZATION AND AXILLARY SENTINEL LYMPH NODE BX Left 08/31/2012   Procedure: left BREAST needle localized  LUMPECTOMY WITH left sentinel lymph node mapping ;  Surgeon: Maisie Fus A. Cornett, MD;  Location: [facility];  Service: General;  Laterality: Left;  needle localization at breast center of [place] 7:30    BREAST SURGERY  08/31/12   [id number]   CESAREAN SECTION  1984, 1986, 1988   CHOLECYSTECTOMY  2010   COLONOSCOPY  04/24/2019   OOPHORECTOMY  Right 1999, Left 2001   POLYPECTOMY     Social History   Tobacco Use   Smoking status: Every Day    Packs/day: 0.25    Years: 30.00    Additional pack years: 0.00    Total pack years: 7.50    Types: Cigarettes    Start date: 04/03/1989   Smokeless tobacco: Never   Tobacco comments:    4 cigarettes per day   Vaping Use   Vaping Use: Never used  Substance Use Topics   Alcohol use: Yes    Comment: rare   Drug use: No   Family History  Problem Relation Age of Onset   Breast cancer Mother 29   Arthritis Mother    Hypertension Mother    Cancer Father        Hodgkin Lymphoma   Other Father        "  twisted colon"   Arthritis Sister    Hyperlipidemia Sister    Depression Brother    Cancer Son         sarcoma of leg, lung and brain   Colon cancer Neg Hx    Esophageal cancer Neg Hx    Stomach cancer Neg Hx    Rectal cancer Neg Hx    No Known Allergies Current Outpatient Medications on File Prior to Visit  Medication Sig Dispense Refill   albuterol (VENTOLIN HFA) 108 (90 Base) MCG/ACT inhaler INHALE 2 PUFFS INTO THE LUNGS EVERY 4 (FOUR) HOURS AS NEEDED FOR SHORTNESS OF BREATH 6.7 each 3   Multiple Vitamins-Minerals (MULTIVITAMIN WITH MINERALS) tablet Take 1 tablet by mouth daily.     rosuvastatin (CRESTOR) 5 MG tablet TAKE 1 TABLET DAILY 90 tablet 3   No current facility-administered medications on file prior to visit.    Review of Systems  Constitutional:  Positive for fatigue. Negative for activity change, appetite change, fever and unexpected weight change.  HENT:  Negative for congestion, ear pain, rhinorrhea, sinus pressure and sore throat.   Eyes:  Negative for pain, redness and visual disturbance.  Respiratory:  Negative for cough, shortness of breath and wheezing.        One episode of shortness of breath walking uphill/never happened again   Cardiovascular:  Negative for chest pain and palpitations.  Gastrointestinal:  Positive for constipation. Negative for abdominal pain, blood in stool and diarrhea.       Chronic constipation   Endocrine: Negative for polydipsia and polyuria.  Genitourinary:  Negative for dysuria, frequency and urgency.  Musculoskeletal:  Positive for arthralgias and myalgias. Negative for back pain and joint swelling.  Skin:  Negative for pallor and rash.  Allergic/Immunologic: Negative for environmental allergies.  Neurological:  Negative for dizziness, syncope and headaches.  Hematological:  Negative for adenopathy. Does not bruise/bleed easily.  Psychiatric/Behavioral:  Positive for dysphoric mood and sleep disturbance. Negative for decreased concentration. The patient is nervous/anxious.        Objective:   Physical Exam Constitutional:       General: She is not in acute distress.    Appearance: Normal appearance. She is well-developed. She is obese. She is not ill-appearing or diaphoretic.  HENT:     Head: Normocephalic and atraumatic.     Right Ear: Tympanic membrane, ear canal and external ear normal.     Left Ear: Tympanic membrane, ear canal and external ear normal.     Nose: Nose normal. No congestion.     Mouth/Throat:     Mouth: Mucous membranes are moist.     Pharynx: Oropharynx is clear. No posterior oropharyngeal erythema.  Eyes:     General: No scleral icterus.    Extraocular Movements: Extraocular movements intact.     Conjunctiva/sclera: Conjunctivae normal.     Pupils: Pupils are equal, round, and reactive to light.  Neck:     Thyroid: No thyromegaly.     Vascular: No carotid bruit or JVD.  Cardiovascular:     Rate and Rhythm: Normal rate and regular rhythm.     Pulses: Normal pulses.     Heart sounds: Normal heart sounds.     No gallop.  Pulmonary:     Effort: Pulmonary effort is normal. No respiratory distress.     Breath sounds: Normal breath sounds. No wheezing.     Comments: Good air exch Chest:     Chest wall: No tenderness.  Abdominal:  General: Bowel sounds are normal. There is no distension or abdominal bruit.     Palpations: Abdomen is soft. There is no mass.     Tenderness: There is no abdominal tenderness.     Hernia: No hernia is present.  Genitourinary:    Comments: Breast exam: No mass, nodules, thickening, tenderness, bulging, retraction, inflamation, nipple discharge or skin changes noted.  No axillary or clavicular LA.     Baseline surgical changes L breast  Musculoskeletal:        General: No tenderness. Normal range of motion.     Cervical back: Normal range of motion and neck supple. No rigidity. No muscular tenderness.     Right lower leg: No edema.     Left lower leg: No edema.     Comments: No kyphosis   Lymphadenopathy:     Cervical: No cervical adenopathy.  Skin:     General: Skin is warm and dry.     Coloration: Skin is not pale.     Findings: No erythema or rash.  Neurological:     Mental Status: She is alert. Mental status is at baseline.     Cranial Nerves: No cranial nerve deficit.     Motor: No abnormal muscle tone.     Coordination: Coordination normal.     Gait: Gait normal.     Deep Tendon Reflexes: Reflexes are normal and symmetric.  Psychiatric:        Mood and Affect: Mood normal.        Cognition and Memory: Cognition and memory normal.           Assessment & Plan:   Problem List Items Addressed This Visit       Digestive   Irritable bowel syndrome with constipation    Ongoing /no clinical change      Adenomatous polyp of colon    Had polyp on colonoscopy 04/2019 with 3-5 y recall  Not candidate for cologuard due to polyp  Is very apprehensive to have another colonoscopy due to tortuous colon         Musculoskeletal and Integument   Hip bursitis, left    Has had inj and PT  Not getting better Offered ref to ortho or f/u with sport med  She is unsure what she wants to do yet       Bunion of great toe    Both feet - some mild changes in first MTP joints Strongly encouraged to wear wide well fitting shoes        Other   Vitamin D deficiency    Ran out/ stopped it  Strongly encouraged to re start for bone and overall health  Level ordered today      Stress reaction    Son lives in [place] and has sarcoma of leg/lung and brain  He is actually doing better  Pt travels back and forth  Good support Declines counseling or treatment at this time           Smoker    Light smoker Has quit several times Disc in detail risks of smoking and possible outcomes including copd, vascular/ heart disease, cancer , respiratory and sinus infections  Pt voices understanding  Not ready to quit currently  Does not qualify for lung cancer screening based on pack years       Routine general medical examination at  a health care facility - Primary    Reviewed health habits including diet and exercise and skin  cancer prevention Reviewed appropriate screening tests for age  Also reviewed health mt list, fam hx and immunization status , as well as social and family history   See HPI Labs reviewed and ordered Mammogram ordered (personal h/o breast cancer)-pt to call and schedule Dexa ordered -pt to call and schedule  Pap utd 06/2019 with 5 y repeat  Declines std screening /no need Encouraged strongly to return to vit D and and consider smoking cessation and strength training for bone health  PHQ 0  (high stress level however and declines counseling or treatment)  Labs pending       Personal history of breast cancer    Years ago Surgery and radiation  Took anti estrogen less than a year/ did not tolerate         Hyperlipidemia    Disc goals for lipids and reasons to control them Rev last labs with pt Rev low sat fat diet in detail  Labs today Was off crestor -unsure if it caused muscle pain/ now back on and tolerating        Estrogen deficiency    Dexa ordered   Post menopausal, smoker and h/o fracture in the past Also early menopause with ovarian removal in 30s       Relevant Orders   DG Bone Density   Encounter for screening mammogram for breast cancer    Mammogram ordered Pt to call and schedule  Personal h/o breast cancer on left (did not tolerate estrogen blocker and took less than 1 y)        Relevant Orders   MM 3D SCREENING MAMMOGRAM BILATERAL BREAST

## 2022-12-01 NOTE — Assessment & Plan Note (Signed)
Has had inj and PT  Not getting better Offered ref to ortho or f/u with sport med  She is unsure what she wants to do yet

## 2022-12-02 ENCOUNTER — Encounter: Payer: Self-pay | Admitting: Family Medicine

## 2022-12-02 LAB — LIPID PANEL
Cholesterol: 195 mg/dL (ref 0–200)
HDL: 60.1 mg/dL (ref >39.00)
LDL Cholesterol: 121 mg/dL — ABNORMAL HIGH (ref 0–99)
NonHDL: 134.82
Total CHOL/HDL Ratio: 3
Triglycerides: 70 mg/dL (ref 0.0–149.0)
VLDL: 14 mg/dL (ref 0.0–40.0)

## 2022-12-02 LAB — CBC WITH DIFFERENTIAL/PLATELET
Basophils Absolute: 0.1 10*3/uL (ref 0.0–0.1)
Basophils Relative: 1.2 % (ref 0.0–3.0)
Eosinophils Absolute: 0.2 10*3/uL (ref 0.0–0.7)
Eosinophils Relative: 2.9 % (ref 0.0–5.0)
HCT: 41.9 % (ref 36.0–46.0)
Hemoglobin: 14.2 g/dL (ref 12.0–15.0)
Lymphocytes Relative: 34.8 % (ref 12.0–46.0)
Lymphs Abs: 2.8 10*3/uL (ref 0.7–4.0)
MCHC: 33.9 g/dL (ref 30.0–36.0)
MCV: 93.7 fl (ref 78.0–100.0)
Monocytes Absolute: 0.5 10*3/uL (ref 0.1–1.0)
Monocytes Relative: 6.6 % (ref 3.0–12.0)
Neutro Abs: 4.4 10*3/uL (ref 1.4–7.7)
Neutrophils Relative %: 54.5 % (ref 43.0–77.0)
Platelets: 424 10*3/uL — ABNORMAL HIGH (ref 150.0–400.0)
RBC: 4.47 Mil/uL (ref 3.87–5.11)
RDW: 12.8 % (ref 11.5–15.5)
WBC: 8.1 10*3/uL (ref 4.0–10.5)

## 2022-12-02 LAB — COMPREHENSIVE METABOLIC PANEL
ALT: 19 U/L (ref 0–35)
AST: 24 U/L (ref 0–37)
Albumin: 4.4 g/dL (ref 3.5–5.2)
Alkaline Phosphatase: 60 U/L (ref 39–117)
BUN: 14 mg/dL (ref 6–23)
CO2: 27 mEq/L (ref 19–32)
Calcium: 9.7 mg/dL (ref 8.4–10.5)
Chloride: 102 mEq/L (ref 96–112)
Creatinine, Ser: 0.71 mg/dL (ref 0.40–1.20)
GFR: 92.02 mL/min (ref >60.00)
Glucose, Bld: 88 mg/dL (ref 70–99)
Potassium: 4.7 mEq/L (ref 3.5–5.1)
Sodium: 137 mEq/L (ref 135–145)
Total Bilirubin: 0.5 mg/dL (ref 0.2–1.2)
Total Protein: 7.4 g/dL (ref 6.0–8.3)

## 2022-12-02 LAB — TSH: TSH: 1.29 u[IU]/mL (ref 0.35–5.50)

## 2022-12-02 LAB — VITAMIN D 25 HYDROXY (VIT D DEFICIENCY, FRACTURES): VITD: 33.25 ng/mL (ref 30.00–100.00)

## 2023-02-04 ENCOUNTER — Ambulatory Visit
Admission: RE | Admit: 2023-02-04 | Discharge: 2023-02-04 | Disposition: A | Discharge status: Home / Self Care | Payer: No Typology Code available for payment source | Source: Ambulatory Visit | Attending: Family Medicine | Admitting: Family Medicine

## 2023-02-04 DIAGNOSIS — Z1231 Encounter for screening mammogram for malignant neoplasm of breast: Secondary | ICD-10-CM

## 2023-02-04 DIAGNOSIS — E2839 Other primary ovarian failure: Secondary | ICD-10-CM

## 2023-02-06 ENCOUNTER — Encounter: Payer: Self-pay | Admitting: Family Medicine

## 2023-02-06 DIAGNOSIS — M8580 Other specified disorders of bone density and structure, unspecified site: Secondary | ICD-10-CM | POA: Insufficient documentation

## 2023-04-22 ENCOUNTER — Other Ambulatory Visit: Payer: Self-pay | Admitting: Family Medicine

## 2023-04-22 DIAGNOSIS — J3089 Other allergic rhinitis: Secondary | ICD-10-CM

## 2023-04-22 DIAGNOSIS — J4520 Mild intermittent asthma, uncomplicated: Secondary | ICD-10-CM

## 2023-08-16 ENCOUNTER — Ambulatory Visit (INDEPENDENT_AMBULATORY_CARE_PROVIDER_SITE_OTHER)
Admission: RE | Admit: 2023-08-16 | Discharge: 2023-08-16 | Disposition: A | Discharge status: Home / Self Care | Payer: 59 | Source: Ambulatory Visit | Attending: Family Medicine | Admitting: Family Medicine

## 2023-08-16 ENCOUNTER — Ambulatory Visit: Payer: 59 | Admitting: Family Medicine

## 2023-08-16 ENCOUNTER — Encounter: Payer: Self-pay | Admitting: Family Medicine

## 2023-08-16 VITALS — BP 106/64 | HR 76 | Temp 98.2°F | Ht 62.0 in | Wt 159.5 lb

## 2023-08-16 DIAGNOSIS — M25561 Pain in right knee: Secondary | ICD-10-CM

## 2023-08-16 DIAGNOSIS — S83281A Other tear of lateral meniscus, current injury, right knee, initial encounter: Secondary | ICD-10-CM | POA: Diagnosis not present

## 2023-08-16 DIAGNOSIS — R936 Abnormal findings on diagnostic imaging of limbs: Secondary | ICD-10-CM | POA: Insufficient documentation

## 2023-08-16 DIAGNOSIS — M542 Cervicalgia: Secondary | ICD-10-CM

## 2023-08-16 DIAGNOSIS — G8929 Other chronic pain: Secondary | ICD-10-CM | POA: Insufficient documentation

## 2023-08-16 NOTE — Assessment & Plan Note (Signed)
 See overview MRI ordered

## 2023-08-16 NOTE — Progress Notes (Signed)
 Subjective:    Patient ID: Destiny Harrell, female    DOB: 1960-07-25, 63 y.o.   MRN: [id number]  HPI  Wt Readings from Last 3 Encounters:  08/16/23 159 lb 8 oz (72.3 kg)  12/01/22 168 lb 2 oz (76.3 kg)  07/12/22 164 lb 2 oz (74.4 kg)   29.17 kg/m  Vitals:   08/16/23 0951  BP: 106/64  Pulse: 76  Temp: 98.2 F (36.8 C)  SpO2: 99%    Pt presents with c/o knee pain and right foot pain   Also notes neck pain is worse than it was  Was addressed by her pcp at [facility] in the past  More numbness in hands also  (feels different from carpal tunnel)   Around the holidays her large dog pulled her /twisted knee (dog hit her also)   and it hurt for a while Used ice Did improve   Now flaring again  Some pain with activity  Also stiffness and pain with inactivity  Very stiff after inactivity (this is all over body at times)  Does not look swollen  Little bruised initially /lateral     Sensitive to touch mostly lateral and under knee cap   Knee feels tight and swollen   Better in am after resting    Per chart has history of chronic pain of right knee (? From injury)  This feels different   Over the counter Uses compression brace  Ice  Taking ibuprofen /oral   Also a topical product that smelled like ben gay   Some pain in top of her foot - that has gone  Feels tight and swollen also  May have medial bunion    Xray DG Knee 4 Views W/Patella Right Result Date: 08/16/2023 CLINICAL DATA:  Right knee pain following twisting injury around the holidays. EXAM: RIGHT KNEE - COMPLETE 4+ VIEW COMPARISON:  None Available. FINDINGS: No evidence of acute fracture, dislocation, or joint effusion. A lucency is noted in the medial aspect of the medial aspect of the patella on sunrise view with cortical discontinuity. Chondrocalcinosis is noted at the knees bilaterally. Soft tissues are unremarkable. IMPRESSION: 1. No acute fracture or dislocation. 2. Lucency in the medial aspect of  the patella with cortical discontinuity, possible osteomyelitis. MRI is suggested for further evaluation. 3. Degenerative changes and chondrocalcinosis at the knee. Electronically Signed   By: Thornell Sartorius M.D.   On: 08/16/2023 12:03     Patient Active Problem List   Diagnosis Date Noted   Right knee pain 08/16/2023   Chronic neck pain 08/16/2023   Abnormal x-ray of knee 08/16/2023   Osteopenia 02/06/2023   Encounter for screening mammogram for breast cancer 12/01/2022   Personal history of breast cancer 12/01/2022   Adenomatous polyp of colon 12/01/2022   Bunion of great toe 12/01/2022   Estrogen deficiency 11/09/2021   Left hip pain 10/26/2021   Hip bursitis, left 07/22/2021   Stress reaction 05/26/2021   Environmental and seasonal allergies 05/12/2021   Mild intermittent asthma without complication 05/12/2021   Hyperlipidemia 10/21/2020   Routine general medical examination at a health care facility 10/13/2020   Vitamin D deficiency 10/13/2020   History of fibula fracture 02/26/2020   Smoker 01/18/2020   Irritable bowel syndrome with constipation 04/05/2019   Past Medical History:  Diagnosis Date   Breast cancer (HCC)    Depression    Hyperlipemia    IBS (irritable bowel syndrome)    Migraines    Personal  history of radiation therapy    Past Surgical History:  Procedure Laterality Date   BREAST BIOPSY Left 08/16/2012   positive    BREAST LUMPECTOMY Left 08/31/2012   BREAST LUMPECTOMY WITH NEEDLE LOCALIZATION AND AXILLARY SENTINEL LYMPH NODE BX Left 08/31/2012   Procedure: left BREAST needle localized  LUMPECTOMY WITH left sentinel lymph node mapping ;  Surgeon: Clovis Pu. Cornett, MD;  Location: [facility];  Service: General;  Laterality: Left;  needle localization at breast center of [place] 7:30    BREAST SURGERY  08/31/12   [id number]-   CESAREAN SECTION  1984, 1986, 1988   CHOLECYSTECTOMY  2010   COLONOSCOPY  04/24/2019   OOPHORECTOMY  Right 1999,  Left 2001   POLYPECTOMY     Social History   Tobacco Use   Smoking status: Every Day    Current packs/day: 0.25    Average packs/day: 0.3 packs/day for 34.4 years (8.6 ttl pk-yrs)    Types: Cigarettes    Start date: 04/03/1989   Smokeless tobacco: Never   Tobacco comments:    4 cigarettes per day   Vaping Use   Vaping status: Never Used  Substance Use Topics   Alcohol use: Yes    Comment: rare   Drug use: No   Family History  Problem Relation Age of Onset   Breast cancer Mother 85   Arthritis Mother    Hypertension Mother    Cancer Father        Hodgkin Lymphoma   Other Father        "twisted colon"   Arthritis Sister    Hyperlipidemia Sister    Depression Brother    Cancer Son        sarcoma of leg, lung and brain   Colon cancer Neg Hx    Esophageal cancer Neg Hx    Stomach cancer Neg Hx    Rectal cancer Neg Hx    No Known Allergies Current Outpatient Medications on File Prior to Visit  Medication Sig Dispense Refill   albuterol (VENTOLIN HFA) 108 (90 Base) MCG/ACT inhaler INHALE 2 PUFFS INTO THE LUNGS EVERY 4 (FOUR) HOURS AS NEEDED FOR SHORTNESS OF BREATH 6.7 each 3   Multiple Vitamins-Minerals (MULTIVITAMIN WITH MINERALS) tablet Take 1 tablet by mouth daily.     No current facility-administered medications on file prior to visit.    Review of Systems  Cardiovascular:  Negative for leg swelling.       Knee feels tight  No ankle swelling   Musculoskeletal:  Positive for arthralgias and neck pain.       Objective:   Physical Exam Constitutional:      General: She is not in acute distress.    Appearance: Normal appearance. She is obese. She is not ill-appearing or diaphoretic.  Neck:     Comments: No CS pain with palp Cardiovascular:     Rate and Rhythm: Normal rate and regular rhythm.  Pulmonary:     Effort: Pulmonary effort is normal. No respiratory distress.     Breath sounds: Normal breath sounds.  Musculoskeletal:     Cervical back: Neck  supple. No rigidity or tenderness.     Comments: Knee right  No obvious effusion , some lateral puffiness No warmth to the touch  No crepitus  ROM: full Flex- full causes tight feeling  Ext -some lateral pain  [place]-some lateral pain  Bounce test -some ant pain   Stability: Anterior drawer-nl Lachman exam -nl  Tenderness -lateral  joint line   Gait -nornal   No right foot tenderness or swelling      Lymphadenopathy:     Cervical: No cervical adenopathy.  Skin:    General: Skin is warm and dry.     Findings: No erythema or rash.  Neurological:     Mental Status: She is alert.     Sensory: No sensory deficit.     Motor: No weakness.  Psychiatric:        Mood and Affect: Mood normal.           Assessment & Plan:   Problem List Items Addressed This Visit       Other   Right knee pain - Primary   Per pt feels different from chronic pain years ago  Recent injury/ twist and impact from her dob (several mo ago) Meniscal inj is possible , as is OA  Some lateral tenderness and positive bounce test  Foot may hurt from change in gait (improved now)  Xray today noted unexpected leucency in medial aspect of patella with cortical discontinuity (MRI recommended to r/o osteoP , also deg changes and chondroncalcinosis of the knee Plan to order MRI   Continues oral ibuprofen prn Ice Compression sleeve  Call back and Er precautions noted in detail today           Relevant Orders   DG Knee 4 Views W/Patella Right (Completed)   MR KNEE RIGHT WO CONTRAST   Chronic neck pain   Years  Worse lately with some hand paresthesias  Was worked up at [facility] by pcp years ago Per pt had mri shoulder - ? If neck included Will attempt to do record review   Encouraged a cervical support pillow  Heat prn  Handout given re: radiculopathy       Abnormal x-ray of knee   See overview MRI ordered       Relevant Orders   MR KNEE RIGHT WO CONTRAST

## 2023-08-16 NOTE — Assessment & Plan Note (Signed)
 Years  Worse lately with some hand paresthesias  Was worked up at [facility] by pcp years ago Per pt had mri shoulder - ? If neck included Will attempt to do record review   Encouraged a cervical support pillow  Heat prn  Handout given re: radiculopathy

## 2023-08-16 NOTE — Patient Instructions (Signed)
 Make sure you have a cervical support pillow that fits you well   Use some heat on your neck for 10 minutes at a time  Keep stretching  Do your old PT exercises if they help   Xray of your knee today  Continue ibuprofen as needed with lots of water  Use ice when able  Continue compression sleeve if helpful   Plan to follow   We will contact you with result of knee film

## 2023-08-16 NOTE — Assessment & Plan Note (Addendum)
 Per pt feels different from chronic pain years ago  Recent injury/ twist and impact from her dob (several mo ago) Meniscal inj is possible , as is OA  Some lateral tenderness and positive bounce test  Foot may hurt from change in gait (improved now)  Xray today noted unexpected leucency in medial aspect of patella with cortical discontinuity (MRI recommended to r/o osteoP , also deg changes and chondroncalcinosis of the knee Plan to order MRI   Continues oral ibuprofen prn Ice Compression sleeve  Call back and Er precautions noted in detail today     Addendum: MRI  Right lateral meniscal tear noted with deg changes also  Ref done to ortho

## 2023-08-18 ENCOUNTER — Encounter: Payer: Self-pay | Admitting: Family Medicine

## 2023-08-18 ENCOUNTER — Ambulatory Visit
Admission: RE | Admit: 2023-08-18 | Discharge: 2023-08-18 | Disposition: A | Discharge status: Home / Self Care | Payer: 59 | Source: Ambulatory Visit | Attending: Family Medicine | Admitting: Family Medicine

## 2023-08-18 DIAGNOSIS — S83206A Unspecified tear of unspecified meniscus, current injury, right knee, initial encounter: Secondary | ICD-10-CM | POA: Insufficient documentation

## 2023-08-18 DIAGNOSIS — R936 Abnormal findings on diagnostic imaging of limbs: Secondary | ICD-10-CM | POA: Diagnosis present

## 2023-08-18 DIAGNOSIS — M25561 Pain in right knee: Secondary | ICD-10-CM | POA: Insufficient documentation

## 2023-08-18 NOTE — Addendum Note (Signed)
 Addended by: Roxy Manns A on: 08/18/2023 06:14 PM   Modules accepted: Orders

## 2023-08-25 ENCOUNTER — Other Ambulatory Visit: Payer: Self-pay | Admitting: Family Medicine

## 2023-08-25 DIAGNOSIS — J3089 Other allergic rhinitis: Secondary | ICD-10-CM

## 2023-08-25 DIAGNOSIS — J4520 Mild intermittent asthma, uncomplicated: Secondary | ICD-10-CM

## 2023-09-01 ENCOUNTER — Ambulatory Visit: Admitting: Orthopaedic Surgery

## 2023-09-01 DIAGNOSIS — M25561 Pain in right knee: Secondary | ICD-10-CM

## 2023-09-01 DIAGNOSIS — G8929 Other chronic pain: Secondary | ICD-10-CM

## 2023-09-01 MED ORDER — LIDOCAINE HCL 1 % IJ SOLN
2.0000 mL | INTRAMUSCULAR | Status: AC | PRN
  Administered 2023-09-01: 2 mL

## 2023-09-01 MED ORDER — METHYLPREDNISOLONE ACETATE 40 MG/ML IJ SUSP
40.0000 mg | INTRAMUSCULAR | Status: AC | PRN
  Administered 2023-09-01: 40 mg via INTRA_ARTICULAR

## 2023-09-01 MED ORDER — BUPIVACAINE HCL 0.5 % IJ SOLN
2.0000 mL | INTRAMUSCULAR | Status: AC | PRN
  Administered 2023-09-01: 2 mL via INTRA_ARTICULAR

## 2023-09-01 NOTE — Progress Notes (Signed)
 Office Visit Note   Patient: Destiny Harrell           Date of Birth: May 18, 1961           MRN: [id number] Visit Date: 09/01/2023              Requested by: Tower, Audrie Gallus, MD [address] [place],  [place] [address] PCP: Judy Pimple, MD   Assessment & Plan: Visit Diagnoses:  1. 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  She has a lateral meniscus tear but currently not reporting any mechanical symptoms.  Treatment options were reviewed and we agreed to try cortisone injection and a 6-week home strengthening program.  She will let me know if symptoms do not get better and we can talk about arthroscopic surgery.  Follow-Up Instructions: No follow-ups on file.   Orders:  No orders of the defined types were placed in this encounter.  No orders of the defined types were placed in this encounter.     Procedures: Large Joint Inj: R knee on 09/01/2023 9:00 AM Indications: pain Details: 22 G needle  Arthrogram: No  Medications: 40 mg methylPREDNISolone acetate 40 MG/ML; 2 mL lidocaine 1 %; 2 mL bupivacaine 0.5 % Consent was given by the patient. Patient was prepped and draped in the usual sterile fashion.       Clinical Data: No additional findings.   Subjective: Chief Complaint  Patient presents with   Right Knee - Pain    HPI Destiny Harrell is a 63 year old female referral from her primary care doctor Tower for right knee pain for a few months.  She had an injury in which her dog ran into her knee.  She is not reporting any true mechanical symptoms.  She is reporting start up symptoms.  Pain is mainly to the lateral side of the knee.  She is unable to do certain activities such as hiking. Review of Systems  Constitutional: Negative.   HENT: Negative.    Eyes: Negative.   Respiratory: Negative.    Cardiovascular: Negative.   Endocrine: Negative.   Musculoskeletal: Negative.    Neurological: Negative.   Hematological: Negative.   Psychiatric/Behavioral: Negative.    All other systems reviewed and are negative.    Objective: Vital Signs: LMP 06/22/1999 (Approximate)   Physical Exam Vitals and nursing note reviewed.  Constitutional:      Appearance: She is well-developed.  HENT:     Head: Atraumatic.     Nose: Nose normal.  Eyes:     Extraocular Movements: Extraocular movements intact.  Cardiovascular:     Pulses: Normal pulses.  Pulmonary:     Effort: Pulmonary effort is normal.  Abdominal:     Palpations: Abdomen is soft.  Musculoskeletal:     Cervical back: Neck supple.  Skin:    General: Skin is warm.     Capillary Refill: Capillary refill takes less than 2 seconds.  Neurological:     Mental Status: She is alert. Mental status is at baseline.  Psychiatric:        Behavior: Behavior normal.        Thought Content: Thought content normal.        Judgment: Judgment normal.     Ortho Exam Examination of the right knee shows trace effusion.  Lateral joint line tenderness.  Normal range of motion.  Collaterals and cruciates are stable.  Specialty Comments:  No specialty comments available.  Imaging: No results found.   PMFS History: Patient Active Problem List   Diagnosis Date Noted   Right knee meniscal tear 08/18/2023   Right knee pain 08/16/2023   Chronic neck pain 08/16/2023   Abnormal x-ray of knee 08/16/2023   Osteopenia 02/06/2023   Encounter for screening mammogram for breast cancer 12/01/2022   Personal history of breast cancer 12/01/2022   Adenomatous polyp of colon 12/01/2022   Bunion of great toe 12/01/2022   Estrogen deficiency 11/09/2021   Left hip pain 10/26/2021   Hip bursitis, left 07/22/2021   Stress reaction 05/26/2021   Environmental and seasonal allergies 05/12/2021   Mild intermittent asthma without complication 05/12/2021   Hyperlipidemia 10/21/2020   Routine general medical examination at a health care  facility 10/13/2020   Vitamin D deficiency 10/13/2020   History of fibula fracture 02/26/2020   Smoker 01/18/2020   Irritable bowel syndrome with constipation 04/05/2019   Past Medical History:  Diagnosis Date   Breast cancer (HCC)    Depression    Hyperlipemia    IBS (irritable bowel syndrome)    Migraines    Personal history of radiation therapy     Family History  Problem Relation Age of Onset   Breast cancer Mother 46   Arthritis Mother    Hypertension Mother    Cancer Father        Hodgkin Lymphoma   Other Father        "twisted colon"   Arthritis Sister    Hyperlipidemia Sister    Depression Brother    Cancer Son        sarcoma of leg, lung and brain   Colon cancer Neg Hx    Esophageal cancer Neg Hx    Stomach cancer Neg Hx    Rectal cancer Neg Hx     Past Surgical History:  Procedure Laterality Date   BREAST BIOPSY Left 08/16/2012   positive    BREAST LUMPECTOMY Left 08/31/2012   BREAST LUMPECTOMY WITH NEEDLE LOCALIZATION AND AXILLARY SENTINEL LYMPH NODE BX Left 08/31/2012   Procedure: left BREAST needle localized  LUMPECTOMY WITH left sentinel lymph node mapping ;  Surgeon: Maisie Fus A. Cornett, MD;  Location: [facility];  Service: General;  Laterality: Left;  needle localization at breast center of [place] 7:30    BREAST SURGERY  08/31/12   [id number]   CESAREAN SECTION  1984, 1986, 1988   CHOLECYSTECTOMY  2010   COLONOSCOPY  04/24/2019   OOPHORECTOMY  Right 1999, Left 2001   POLYPECTOMY     Social History   Occupational History    Employer: [employer]  Tobacco Use   Smoking status: Every Day    Current packs/day: 0.25    Average packs/day: 0.3 packs/day for 34.4 years (8.6 ttl pk-yrs)    Types: Cigarettes    Start date: 04/03/1989   Smokeless tobacco: Never   Tobacco comments:    4 cigarettes per day   Vaping Use   Vaping status: Never Used  Substance and Sexual Activity   Alcohol use: Yes    Comment: rare   Drug use: No    Sexual activity: Yes

## 2023-11-25 ENCOUNTER — Ambulatory Visit: Payer: Self-pay

## 2023-11-25 ENCOUNTER — Encounter: Payer: Self-pay | Admitting: Internal Medicine

## 2023-11-25 ENCOUNTER — Ambulatory Visit: Admitting: Internal Medicine

## 2023-11-25 VITALS — BP 118/68 | HR 78 | Temp 98.6°F | Ht 62.0 in | Wt 158.0 lb

## 2023-11-25 DIAGNOSIS — F17200 Nicotine dependence, unspecified, uncomplicated: Secondary | ICD-10-CM | POA: Diagnosis not present

## 2023-11-25 DIAGNOSIS — M79645 Pain in left finger(s): Secondary | ICD-10-CM

## 2023-11-25 DIAGNOSIS — E559 Vitamin D deficiency, unspecified: Secondary | ICD-10-CM | POA: Diagnosis not present

## 2023-11-25 MED ORDER — PREDNISONE 10 MG PO TABS: ORAL_TABLET | ORAL | 0 refills | Status: DC

## 2023-11-25 NOTE — Assessment & Plan Note (Signed)
Counsled to quit, pt not ready

## 2023-11-25 NOTE — Telephone Encounter (Signed)
Aware, will watch for correspondence Thanks

## 2023-11-25 NOTE — Assessment & Plan Note (Signed)
 Last vitamin D  Lab Results  Component Value Date   VD25OH 33.25 12/01/2022   Low, to start  oral replacement

## 2023-11-25 NOTE — Progress Notes (Signed)
 Patient ID: Destiny Harrell, female   DOB: 1960-11-14, 63 y.o.   MRN: [id number]        Chief Complaint: follow up 24finger left hand PIP swelling        HPI:  Destiny Harrell is a 63 y.o. female here with c/o above after walking the 100 lb dog and got the leash tangled in her hand and yanked on the 4th finger 1 wk ago.  Has had similar in past but only took 1-2 days to resolve.  Does not think she had fracture, pain mild to mod, but noticed swelling in particular to the PIP and reduced ROM.  Pt denies chest pain, increased sob or doe, wheezing, orthopnea, PND, increased LE swelling, palpitations, dizziness or syncope.   Pt denies polydipsia, polyuria, or new focal neuro s/s.        Wt Readings from Last 3 Encounters:  11/25/23 158 lb (71.7 kg)  08/16/23 159 lb 8 oz (72.3 kg)  12/01/22 168 lb 2 oz (76.3 kg)   BP Readings from Last 3 Encounters:  11/25/23 118/68  08/16/23 106/64  12/01/22 136/82         Past Medical History:  Diagnosis Date   Breast cancer (HCC)    Depression    Hyperlipemia    IBS (irritable bowel syndrome)    Migraines    Personal history of radiation therapy    Past Surgical History:  Procedure Laterality Date   BREAST BIOPSY Left 08/16/2012   positive    BREAST LUMPECTOMY Left 08/31/2012   BREAST LUMPECTOMY WITH NEEDLE LOCALIZATION AND AXILLARY SENTINEL LYMPH NODE BX Left 08/31/2012   Procedure: left BREAST needle localized  LUMPECTOMY WITH left sentinel lymph node mapping ;  Surgeon: Andy Bannister A. Cornett, MD;  Location: [facility];  Service: General;  Laterality: Left;  needle localization at breast center of [place] 7:30    BREAST SURGERY  08/31/12   ER+PR-HER-2neu-   CESAREAN SECTION  1984, 1986, 1988   CHOLECYSTECTOMY  2010   COLONOSCOPY  04/24/2019   OOPHORECTOMY  Right 1999, Left 2001   POLYPECTOMY      reports that she has been smoking cigarettes. She started smoking about 34 years ago. She has a 8.7 pack-year smoking history. She has never  used smokeless tobacco. She reports current alcohol use. She reports that she does not use drugs. family history includes Arthritis in her mother and sister; Breast cancer (age of onset: 82) in her mother; Cancer in her father and son; Depression in her brother; Hyperlipidemia in her sister; Hypertension in her mother; Other in her father. No Known Allergies Current Outpatient Medications on File Prior to Visit  Medication Sig Dispense Refill   albuterol  (VENTOLIN  HFA) 108 (90 Base) MCG/ACT inhaler INHALE 2 PUFFS INTO THE LUNGS EVERY 4 (FOUR) HOURS AS NEEDED FOR SHORTNESS OF BREATH 6.7 each 3   Multiple Vitamins-Minerals (MULTIVITAMIN WITH MINERALS) tablet Take 1 tablet by mouth daily.     No current facility-administered medications on file prior to visit.        ROS:  All others reviewed and negative.  Objective        PE:  BP 118/68 (BP Location: Right Arm, Patient Position: Sitting, Cuff Size: Normal)   Pulse 78   Temp 98.6 F (37 C) (Oral)   Ht 5\' 2"  (1.575 m)   Wt 158 lb (71.7 kg)   LMP 06/22/1999 (Approximate)   SpO2 98%   BMI 28.90 kg/m  Constitutional: Pt appears in NAD               HENT: Head: NCAT.                Right Ear: External ear normal.                 Left Ear: External ear normal.                Eyes: . Pupils are equal, round, and reactive to light. Conjunctivae and EOM are normal               Nose: without d/c or deformity               Neck: Neck supple. Gross normal ROM               Cardiovascular: Normal rate and regular rhythm.                 Pulmonary/Chest: Effort normal and breath sounds without rales or wheezing.                Abd:  Soft, NT, ND, + BS, no organomegaly               Neurological: Pt is alert. At baseline orientation, motor grossly intact               Skin: Skin is warm. No rashes, no other new lesions, LE edema - none               Psychiatric: Pt behavior is normal without agitation   Micro: none  Cardiac  tracings I have personally interpreted today:  none  Pertinent Radiological findings (summarize): none   Lab Results  Component Value Date   WBC 8.1 12/01/2022   HGB 14.2 12/01/2022   HCT 41.9 12/01/2022   PLT 424.0 (H) 12/01/2022   GLUCOSE 88 12/01/2022   CHOL 195 12/01/2022   TRIG 70.0 12/01/2022   HDL 60.10 12/01/2022   LDLCALC 121 (H) 12/01/2022   ALT 19 12/01/2022   AST 24 12/01/2022   NA 137 12/01/2022   K 4.7 12/01/2022   CL 102 12/01/2022   CREATININE 0.71 12/01/2022   BUN 14 12/01/2022   CO2 27 12/01/2022   TSH 1.29 12/01/2022   Assessment/Plan:  Destiny Harrell is a 63 y.o. White or Caucasian [1] female with  has a past medical history of Breast cancer (HCC), Depression, Hyperlipemia, IBS (irritable bowel syndrome), Migraines, and Personal history of radiation therapy.  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  Destiny Colonel, MD 11/25/2023 7:46 PM [facility]

## 2023-11-25 NOTE — Assessment & Plan Note (Signed)
 4th finger left hand with mild swelling pain to the PIP joint, doubt fx, pt declines xray, most likely c/w post traumatic arthritis or tendonitis; for voltaren gel topical, prednisone  taper, gradually increase use during day, finger cot at night for support, and consider hand surgury if not improved in 1-2 wks

## 2023-11-25 NOTE — Telephone Encounter (Signed)
 FYI Only or Action Required?: FYI only for provider  Patient was last seen in primary care on 08/16/2023 by Clemens Curt, MD. Called Nurse Triage reporting finger swelling. Symptoms began several days ago. Interventions attempted: OTC medications: ibuprofen, Ice/heat application, and Other: finger splint/brace. Symptoms are: swelling and pain in left ring finger gradually worsening.  Triage Disposition: See Physician Within 24 Hours  Patient/caregiver understands and will follow disposition?: Yes                Copied from CRM [id number]. Topic: Clinical - Red Word Triage >> Nov 25, 2023  2:07 PM Destiny Harrell wrote: Red Word that prompted transfer to Nurse Triage: swelling in finger x1 week Reason for Disposition  Finger joint can't be opened (straightened) or closed (bent) completely  (Note: injured person should be able to do this without assistance)  Answer Assessment - Initial Assessment Questions 1. MECHANISM: "How did the injury happen?"      Dog leash wrapped around finger and squeezed.  2. ONSET: "When did the injury happen?" (Minutes or hours ago)      Saturday.  3. LOCATION: "What part of the finger is injured?" "Is the nail damaged?"      Left ring finger (knuckle).  4. APPEARANCE of the INJURY: "What does the injury look like?"      Swelling, states it looks like a bump on the knuckle.  5. SEVERITY: "Can you use the hand normally?"  "Can you bend your fingers into a ball and then fully open them?"     She states left ring finger was able to fully bend and use the day after, but now is not able to bend all the way.   6. SIZE: For cuts, bruises, or swelling, ask: "How large is it?" (e.g., inches or centimeters;  entire finger)      Slight bruising and swelling to left ring finger.  7. PAIN: "Is there pain?" If Yes, ask: "How bad is the pain?"    (e.g., Scale 1-10; or mild, moderate, severe)  - NONE (0): no pain.  - MILD (1-3): doesn't interfere with normal  activities.   - MODERATE (4-7): interferes with normal activities or awakens from sleep.  - SEVERE (8-10): excruciating pain, unable to hold a glass of water or bend finger even a little.     Yes when bumping into or touching the area, mild ache pain. No pain at rest.  8. TETANUS: For any breaks in the skin, ask: "When was the last tetanus booster?"     No open wounds or cuts.  9. OTHER SYMPTOMS: "Do you have any other symptoms?"     No.  10. PREGNANCY: "Is there any chance you are pregnant?" "When was your last menstrual period?"       N/A.  Patient states she bought an OTC brace/splint for her finger, but it has been causing worsened pain. She states she has also tried icing the injury and taking ibuprofen at night.  Protocols used: Finger Injury-A-AH

## 2023-11-25 NOTE — Patient Instructions (Signed)
 Please take all new medication as prescribed  - the prednisone   You can also use OTC Voltaren gel as needed for pain and swelling  Please continue all other medications as before, and refills have been done if requested.  Please have the pharmacy call with any other refills you may need.  Please keep your appointments with your specialists as you may have planned

## 2024-01-23 ENCOUNTER — Other Ambulatory Visit: Payer: Self-pay | Admitting: Family Medicine

## 2024-01-23 DIAGNOSIS — J4520 Mild intermittent asthma, uncomplicated: Secondary | ICD-10-CM

## 2024-01-23 DIAGNOSIS — J3089 Other allergic rhinitis: Secondary | ICD-10-CM

## 2024-01-23 NOTE — Telephone Encounter (Signed)
 Pt is overdue for her CPE (labs prior) please schedule and route back to me to refill

## 2024-01-24 NOTE — Telephone Encounter (Signed)
Lvm call back

## 2024-01-25 NOTE — Telephone Encounter (Signed)
 LVM sent Mychart to schedule

## 2024-01-26 ENCOUNTER — Encounter: Payer: Self-pay | Admitting: Family Medicine

## 2024-01-26 ENCOUNTER — Other Ambulatory Visit

## 2024-01-26 ENCOUNTER — Ambulatory Visit (INDEPENDENT_AMBULATORY_CARE_PROVIDER_SITE_OTHER): Admitting: Family Medicine

## 2024-01-26 ENCOUNTER — Ambulatory Visit: Payer: Self-pay | Admitting: Family Medicine

## 2024-01-26 VITALS — BP 116/72 | HR 68 | Temp 97.9°F | Ht 62.0 in | Wt 156.5 lb

## 2024-01-26 DIAGNOSIS — M8580 Other specified disorders of bone density and structure, unspecified site: Secondary | ICD-10-CM

## 2024-01-26 DIAGNOSIS — E7800 Pure hypercholesterolemia, unspecified: Secondary | ICD-10-CM

## 2024-01-26 DIAGNOSIS — Z23 Encounter for immunization: Secondary | ICD-10-CM

## 2024-01-26 DIAGNOSIS — Z1231 Encounter for screening mammogram for malignant neoplasm of breast: Secondary | ICD-10-CM

## 2024-01-26 DIAGNOSIS — Z0000 Encounter for general adult medical examination without abnormal findings: Secondary | ICD-10-CM

## 2024-01-26 DIAGNOSIS — F17200 Nicotine dependence, unspecified, uncomplicated: Secondary | ICD-10-CM

## 2024-01-26 DIAGNOSIS — E559 Vitamin D deficiency, unspecified: Secondary | ICD-10-CM | POA: Diagnosis not present

## 2024-01-26 DIAGNOSIS — D126 Benign neoplasm of colon, unspecified: Secondary | ICD-10-CM | POA: Diagnosis not present

## 2024-01-26 LAB — COMPREHENSIVE METABOLIC PANEL WITH GFR
ALT: 17 U/L (ref 0–35)
AST: 20 U/L (ref 0–37)
Albumin: 4.4 g/dL (ref 3.5–5.2)
Alkaline Phosphatase: 56 U/L (ref 39–117)
BUN: 13 mg/dL (ref 6–23)
CO2: 25 meq/L (ref 19–32)
Calcium: 9.5 mg/dL (ref 8.4–10.5)
Chloride: 104 meq/L (ref 96–112)
Creatinine, Ser: 0.63 mg/dL (ref 0.40–1.20)
GFR: 95.24 mL/min (ref >60.00)
Glucose, Bld: 88 mg/dL (ref 70–99)
Potassium: 4.5 meq/L (ref 3.5–5.1)
Sodium: 142 meq/L (ref 135–145)
Total Bilirubin: 0.5 mg/dL (ref 0.2–1.2)
Total Protein: 6.9 g/dL (ref 6.0–8.3)

## 2024-01-26 LAB — CBC WITH DIFFERENTIAL/PLATELET
Basophils Absolute: 0.1 K/uL (ref 0.0–0.1)
Basophils Relative: 1.2 % (ref 0.0–3.0)
Eosinophils Absolute: 0.1 K/uL (ref 0.0–0.7)
Eosinophils Relative: 1.7 % (ref 0.0–5.0)
HCT: 42.6 % (ref 36.0–46.0)
Hemoglobin: 14.3 g/dL (ref 12.0–15.0)
Lymphocytes Relative: 37.4 % (ref 12.0–46.0)
Lymphs Abs: 2.6 K/uL (ref 0.7–4.0)
MCHC: 33.5 g/dL (ref 30.0–36.0)
MCV: 92.5 fl (ref 78.0–100.0)
Monocytes Absolute: 0.5 K/uL (ref 0.1–1.0)
Monocytes Relative: 6.6 % (ref 3.0–12.0)
Neutro Abs: 3.7 K/uL (ref 1.4–7.7)
Neutrophils Relative %: 53.1 % (ref 43.0–77.0)
Platelets: 372 K/uL (ref 150.0–400.0)
RBC: 4.61 Mil/uL (ref 3.87–5.11)
RDW: 13.2 % (ref 11.5–15.5)
WBC: 7.1 K/uL (ref 4.0–10.5)

## 2024-01-26 LAB — LIPID PANEL
Cholesterol: 273 mg/dL — ABNORMAL HIGH (ref 0–200)
HDL: 51.8 mg/dL (ref >39.00)
LDL Cholesterol: 197 mg/dL — ABNORMAL HIGH (ref 0–99)
NonHDL: 220.96
Total CHOL/HDL Ratio: 5
Triglycerides: 122 mg/dL (ref 0.0–149.0)
VLDL: 24.4 mg/dL (ref 0.0–40.0)

## 2024-01-26 LAB — VITAMIN D 25 HYDROXY (VIT D DEFICIENCY, FRACTURES): VITD: 29.82 ng/mL — ABNORMAL LOW (ref 30.00–100.00)

## 2024-01-26 LAB — TSH: TSH: 1.33 u[IU]/mL (ref 0.35–5.50)

## 2024-01-26 NOTE — Progress Notes (Signed)
 Subjective:    Patient ID: Destiny Harrell, female    DOB: [birth year], 62 y.o.   MRN: [id number]  HPI  Here for health maintenance exam and to review chronic medical problems   Wt Readings from Last 3 Encounters:  01/26/24 156 lb 8 oz (71 kg)  11/25/23 158 lb (71.7 kg)  08/16/23 159 lb 8 oz (72.3 kg)   28.62 kg/m  Vitals:   01/26/24 1016  BP: 116/72  Pulse: 68  Temp: 97.9 F (36.6 C)  SpO2: 99%    Immunization History  Administered Date(s) Administered   Influenza,inj,Quad PF,6+ Mos 04/04/2019   PFIZER(Purple Top)SARS-COV-2 Vaccination 08/31/2019, 09/25/2019, 06/02/2020, 07/08/2021   Tdap 04/04/2019   Zoster Recombinant(Shingrix) 12/10/2019, 07/08/2021    Health Maintenance Due  Topic Date Due   HIV Screening  Never done   Hepatitis C Screening  Never done   Pneumococcal Vaccine: 19-49 Years (1 of 2 - PCV) Never done   Pneumococcal Vaccine: 50+ Years (1 of 2 - PCV) Never done   Colonoscopy  04/23/2022   Pna vaccine   Mammogram 01/2023  Personal history of breast cancer  Self breast exam- no lumps   Gyn health No problems, just dryness      Colon cancer screening -colonoscopy 04/2019 with 3 -5 y recall for adenomatous polyp   Bone health  Dexa 01/2023 breast center    osteopenia  Falls-none  Fractures-none  Supplements - taking extra vitamin D  and ca  Last vitamin D  Lab Results  Component Value Date   VD25OH 33.25 12/01/2022    Exercise :  Less with the heat  Walking every day/routine  Strength training -regular    Smoking status -about 4 cig day  Has quit and gone back to it   Son is getting married in [place]  He is recovering from cancer    Mood    01/26/2024   10:27 AM 11/25/2023    3:22 PM 08/16/2023   10:04 AM 07/12/2022    8:31 AM 10/26/2021    3:29 PM  Depression screen PHQ 2/9  Decreased Interest 0 0 0 0 0  Down, Depressed, Hopeless 0 0 0 0 0  PHQ - 2 Score 0 0 0 0 0  Altered sleeping 0  0    Tired, decreased energy 0  1     Change in appetite 0  1    Feeling bad or failure about yourself  0  0    Trouble concentrating 0  0    Moving slowly or fidgety/restless 0  0    Suicidal thoughts 0  0    PHQ-9 Score 0  2    Difficult doing work/chores Not difficult at all  Not difficult at all       Hyperlipidemia Lab Results  Component Value Date   CHOL 195 12/01/2022   HDL 60.10 12/01/2022   LDLCALC 121 (H) 12/01/2022   TRIG 70.0 12/01/2022   CHOLHDL 3 12/01/2022   Took crestor  on past  Could not tolerate it - tried several times  Bad muscle pain   Diet has been good  No red meat     The 10-year ASCVD risk score (Arnett DK, et al., 2019) is: 6.2%   Values used to calculate the score:     Age: 26 years     Clincally relevant sex: Female     Is Non-Hispanic African American: No     Diabetic: No     Tobacco smoker: Yes  Systolic Blood Pressure: 116 mmHg     Is BP treated: No     HDL Cholesterol: 60.1 mg/dL     Total Cholesterol: 195 mg/dL  Ongoing orthopedic problems Knee problems Finger injury-had to take prednisone        Patient Active Problem List   Diagnosis Date Noted   Finger pain, left 11/25/2023   Right knee meniscal tear 08/18/2023   Right knee pain 08/16/2023   Chronic neck pain 08/16/2023   Abnormal x-ray of knee 08/16/2023   Osteopenia 02/06/2023   Encounter for screening mammogram for breast cancer 12/01/2022   Personal history of breast cancer 12/01/2022   Adenomatous polyp of colon 12/01/2022   Bunion of great toe 12/01/2022   Estrogen deficiency 11/09/2021   Left hip pain 10/26/2021   Hip bursitis, left 07/22/2021   Stress reaction 05/26/2021   Environmental and seasonal allergies 05/12/2021   Mild intermittent asthma without complication 05/12/2021   Hyperlipidemia 10/21/2020   Routine general medical examination at a health care facility 10/13/2020   Vitamin D  deficiency 10/13/2020   History of fibula fracture 02/26/2020   Smoker 01/18/2020   Irritable  bowel syndrome with constipation 04/05/2019   Past Medical History:  Diagnosis Date   Breast cancer (HCC)    Depression    Hyperlipemia    IBS (irritable bowel syndrome)    Migraines    Personal history of radiation therapy    Past Surgical History:  Procedure Laterality Date   BREAST BIOPSY Left 08/16/2012   positive    BREAST LUMPECTOMY Left 08/31/2012   BREAST LUMPECTOMY WITH NEEDLE LOCALIZATION AND AXILLARY SENTINEL LYMPH NODE BX Left 08/31/2012   Procedure: left BREAST needle localized  LUMPECTOMY WITH left sentinel lymph node mapping ;  Surgeon: Debby A. Cornett, MD;  Location: [facility];  Service: General;  Laterality: Left;  needle localization at breast center of [place] 7:30    BREAST SURGERY  08/31/2012   [id number]-   CESAREAN SECTION  1984, 1986, 1988   CHOLECYSTECTOMY  2010   COLONOSCOPY  04/24/2019   HERNIA REPAIR  2001   Bowel   OOPHORECTOMY  Right 1999, Left 2001   POLYPECTOMY     TUBAL LIGATION  1988   Social History   Tobacco Use   Smoking status: Every Day    Current packs/day: 0.25    Average packs/day: 0.3 packs/day for 34.8 years (8.7 ttl pk-yrs)    Types: Cigarettes    Start date: 04/03/1989   Smokeless tobacco: Never   Tobacco comments:    4 cigarettes per day   Vaping Use   Vaping status: Never Used  Substance Use Topics   Alcohol use: Yes    Comment: rare   Drug use: No   Family History  Problem Relation Age of Onset   Breast cancer Mother 82   Arthritis Mother    Hypertension Mother    Cancer Mother    Cancer Father        Hodgkin Lymphoma   Other Father        twisted colon   Arthritis Sister    Hyperlipidemia Sister    Depression Brother    Cancer Son        sarcoma of leg, lung and brain   Colon cancer Neg Hx    Esophageal cancer Neg Hx    Stomach cancer Neg Hx    Rectal cancer Neg Hx    No Known Allergies Current Outpatient Medications on File Prior to  Visit  Medication Sig Dispense Refill    albuterol  (VENTOLIN  HFA) 108 (90 Base) MCG/ACT inhaler INHALE 2 PUFFS INTO THE LUNGS EVERY 4 (FOUR) HOURS AS NEEDED FOR SHORTNESS OF BREATH 6.7 each 3   Multiple Vitamins-Minerals (MULTIVITAMIN WITH MINERALS) tablet Take 1 tablet by mouth daily.     No current facility-administered medications on file prior to visit.    Review of Systems     Objective:   Physical Exam Constitutional:      General: She is not in acute distress.    Appearance: Normal appearance. She is well-developed and normal weight. She is not ill-appearing or diaphoretic.  HENT:     Head: Normocephalic and atraumatic.     Right Ear: Tympanic membrane, ear canal and external ear normal.     Left Ear: Tympanic membrane, ear canal and external ear normal.     Nose: Nose normal. No congestion.     Mouth/Throat:     Mouth: Mucous membranes are moist.     Pharynx: Oropharynx is clear. No posterior oropharyngeal erythema.  Eyes:     General: No scleral icterus.    Extraocular Movements: Extraocular movements intact.     Conjunctiva/sclera: Conjunctivae normal.     Pupils: Pupils are equal, round, and reactive to light.  Neck:     Thyroid: No thyromegaly.     Vascular: No carotid bruit or JVD.  Cardiovascular:     Rate and Rhythm: Normal rate and regular rhythm.     Pulses: Normal pulses.     Heart sounds: Normal heart sounds.     No gallop.  Pulmonary:     Effort: Pulmonary effort is normal. No respiratory distress.     Breath sounds: Normal breath sounds. No wheezing.     Comments: Good air exch Chest:     Chest wall: No tenderness.  Abdominal:     General: Bowel sounds are normal. There is no distension or abdominal bruit.     Palpations: Abdomen is soft. There is no mass.     Tenderness: There is no abdominal tenderness.     Hernia: No hernia is present.  Genitourinary:    Comments: Breast exam: No mass, nodules, thickening, tenderness, bulging, retraction, inflamation, nipple discharge or skin changes  noted.  No axillary or clavicular LA.     Baseline surgical change in left breast  Musculoskeletal:        General: No tenderness. Normal range of motion.     Cervical back: Normal range of motion and neck supple. No rigidity. No muscular tenderness.     Right lower leg: No edema.     Left lower leg: No edema.     Comments: No kyphosis   Lymphadenopathy:     Cervical: No cervical adenopathy.  Skin:    General: Skin is warm and dry.     Coloration: Skin is not pale.     Findings: No erythema or rash.  Neurological:     Mental Status: She is alert. Mental status is at baseline.     Cranial Nerves: No cranial nerve deficit.     Motor: No abnormal muscle tone.     Coordination: Coordination normal.     Gait: Gait normal.     Deep Tendon Reflexes: Reflexes are normal and symmetric.  Psychiatric:        Mood and Affect: Mood normal.        Cognition and Memory: Cognition and memory normal.  Assessment & Plan:   Problem List Items Addressed This Visit       Digestive   Adenomatous polyp of colon   Due for 5 y recall colonoscopy in nov   Order is in  Pt will call to schedule       Relevant Orders   Ambulatory referral to Gastroenterology     Musculoskeletal and Integument   Osteopenia   Dexa 01/2023 Smoker not ready to quit  Discussed fall prevention, supplements and exercise for bone density  No falls or fractures         Other   Vitamin D  deficiency   D level today  Taking supplement for bones with ca and D      Relevant Orders   VITAMIN D  25 Hydroxy (Vit-D Deficiency, Fractures)   Smoker   Light smoker Disc in detail risks of smoking and possible outcomes including copd, vascular/ heart disease, cancer , respiratory and sinus infections as well as osteoporosis  Pt voices understanding Pt is not ready to quit   Has osteopenia       Routine general medical examination at a health care facility - Primary   Reviewed health habits including  diet and exercise and skin cancer prevention Reviewed appropriate screening tests for age  Also reviewed health mt list, fam hx and immunization status , as well as social and family history   See HPI Labs reviewed and ordered Health Maintenance  Topic Date Due   HIV Screening  Never done   Hepatitis C Screening  Never done   Pneumococcal Vaccine for high risk medical condition (1 of 2 - PCV) Never done   Pneumococcal Vaccine for age over 83 (1 of 2 - PCV) Never done   Colon Cancer Screening  04/23/2022   COVID-19 Vaccine (5 - 2024-25 season) 08/31/2024*   Flu Shot  09/18/2024*   Mammogram  02/04/2024   Pap with HPV screening  07/05/2024   DEXA scan (bone density measurement)  02/03/2025   DTaP/Tdap/Td vaccine (2 - Td or Tdap) 04/03/2029   Zoster (Shingles) Vaccine  Completed   Hepatitis B Vaccine  Aged Out   HPV Vaccine  Aged Out   Meningitis B Vaccine  Aged Out  *Topic was postponed. The date shown is not the original due date.    Prevnar 20 vaccine today (smoker) Counseled on smoking cessation No falls or fractures  Discussed fall prevention, supplements and exercise for bone density  GI ref for 5 y colonoscopy Mammo ordered  PHQ 0  Lab today       Relevant Orders   TSH   Comprehensive metabolic panel with GFR   CBC with Differential/Platelet   Hyperlipidemia   Disc goals for lipids and reasons to control them Rev last labs with pt Rev low sat fat diet in detail   Off crestor -caused myopathy  Encouraged to cut red meat further in diet   Consider zetia-pending labs       Relevant Orders   Lipid Panel   Comprehensive metabolic panel with GFR   Encounter for screening mammogram for breast cancer   Mammo ordered Pt will call to schedule       Relevant Orders   MM 3D SCREENING MAMMOGRAM BILATERAL BREAST

## 2024-01-26 NOTE — Assessment & Plan Note (Signed)
 Dexa 01/2023 Smoker not ready to quit  Discussed fall prevention, supplements and exercise for bone density  No falls or fractures

## 2024-01-26 NOTE — Assessment & Plan Note (Signed)
 Light smoker Disc in detail risks of smoking and possible outcomes including copd, vascular/ heart disease, cancer , respiratory and sinus infections as well as osteoporosis  Pt voices understanding Pt is not ready to quit   Has osteopenia

## 2024-01-26 NOTE — Assessment & Plan Note (Signed)
 Mammo ordered Pt will call to schedule

## 2024-01-26 NOTE — Assessment & Plan Note (Signed)
 Disc goals for lipids and reasons to control them Rev last labs with pt Rev low sat fat diet in detail   Off crestor -caused myopathy  Encouraged to cut red meat further in diet   Consider zetia-pending labs

## 2024-01-26 NOTE — Assessment & Plan Note (Signed)
 Reviewed health habits including diet and exercise and skin cancer prevention Reviewed appropriate screening tests for age  Also reviewed health mt list, fam hx and immunization status , as well as social and family history   See HPI Labs reviewed and ordered Health Maintenance  Topic Date Due   HIV Screening  Never done   Hepatitis C Screening  Never done   Pneumococcal Vaccine for high risk medical condition (1 of 2 - PCV) Never done   Pneumococcal Vaccine for age over 63 (1 of 2 - PCV) Never done   Colon Cancer Screening  04/23/2022   COVID-19 Vaccine (5 - 2024-25 season) 08/31/2024*   Flu Shot  09/18/2024*   Mammogram  02/04/2024   Pap with HPV screening  07/05/2024   DEXA scan (bone density measurement)  02/03/2025   DTaP/Tdap/Td vaccine (2 - Td or Tdap) 04/03/2029   Zoster (Shingles) Vaccine  Completed   Hepatitis B Vaccine  Aged Out   HPV Vaccine  Aged Out   Meningitis B Vaccine  Aged Out  *Topic was postponed. The date shown is not the original due date.    Prevnar 20 vaccine today (smoker) Counseled on smoking cessation No falls or fractures  Discussed fall prevention, supplements and exercise for bone density  GI ref for 5 y colonoscopy Mammo ordered  PHQ 0  Lab today

## 2024-01-26 NOTE — Patient Instructions (Addendum)
 Pneumonia shot today / prevnar 20   Stay active  Add some strength training to your routine, this is important for bone and brain health and can reduce your risk of falls and help your body use insulin properly and regulate weight  Light weights, exercise bands , and internet videos are a good way to start  Yoga (chair or regular), machines , floor exercises or a gym with machines are also good options    Labs today  Read about the generic of zetia - we may want to try it instead of a statin   For cholesterol  Avoid red meat/ fried foods/ egg yolks/ fatty breakfast meats/ butter, cheese and high fat dairy/ and shellfish  Sub ground [place] for ground beef for cooking  Labs today    Call to schedule your colonoscopy / I think it is due in November   [facility]  [phone]     You have an order for:  [x]   3D Mammogram  []   Bone Density     Please call for appointment:   []   [facility]  [address] [place] [address]  [phone]  []   [facility]   [address]  [place], [place] [address]  [phone]  [x]   [facility]      [address] [place], [place]        [phone]         []   [facility]  [address] [place], [place]  [phone]  []  [facility]   [address]   [place], [place] [address]  [phone]  []  [facility]  [address] [place], [place] [address] [phone]    Make sure to wear two piece clothing  No lotions powders or deodorants the day of the appointment Make sure to bring picture ID and insurance card.  Bring list of medications you are currently taking including any supplements.   Schedule your screening mammogram through MyChart!   Select [facility] imaging sites can now be scheduled through MyChart.  Log into your MyChart account.  Go to 'Visit' (or 'Appointments' if  on mobile App) --> Schedule an  Appointment  Under 'Select a Reason for Visit' choose the Mammogram  Screening option.  Complete the pre-visit questions  and select the time and place that  best fits your schedule

## 2024-01-26 NOTE — Assessment & Plan Note (Signed)
 Due for 5 y recall colonoscopy in nov   Order is in  Pt will call to schedule

## 2024-01-26 NOTE — Assessment & Plan Note (Signed)
 D level today  Taking supplement for bones with ca and D

## 2024-01-29 MED ORDER — EZETIMIBE 10 MG PO TABS: 10.0000 mg | ORAL_TABLET | Freq: Every day | ORAL | 3 refills | Status: AC

## 2024-01-29 NOTE — Addendum Note (Signed)
 Addended by: RANDEEN HARDY A on: 01/29/2024 05:19 PM   Modules accepted: Orders

## 2024-01-30 ENCOUNTER — Telehealth: Payer: Self-pay | Admitting: *Deleted

## 2024-01-30 ENCOUNTER — Encounter: Payer: Self-pay | Admitting: Family Medicine

## 2024-01-30 NOTE — Telephone Encounter (Signed)
 Lvmtcb, 3rd attempt. Sent letter

## 2024-01-30 NOTE — Telephone Encounter (Signed)
 Called pt back and advise it was to schedule lab appt., appt scheduled

## 2024-01-30 NOTE — Telephone Encounter (Signed)
 Copied from CRM [id number]. Topic: General - Other >> Jan 30, 2024 12:53 PM Paige D wrote: Reason for CRM: Pt calling in regards to a missed call and vm from joellen called [facility] CMA is with patients in office. Advised pt she will get a call back from office

## 2024-02-09 ENCOUNTER — Ambulatory Visit
Admission: RE | Admit: 2024-02-09 | Discharge: 2024-02-09 | Disposition: A | Discharge status: Home / Self Care | Source: Ambulatory Visit | Attending: Family Medicine | Admitting: Family Medicine

## 2024-02-09 DIAGNOSIS — Z1231 Encounter for screening mammogram for malignant neoplasm of breast: Secondary | ICD-10-CM

## 2024-02-15 ENCOUNTER — Encounter: Payer: Self-pay | Admitting: Family Medicine

## 2024-02-15 ENCOUNTER — Ambulatory Visit: Admitting: Family Medicine

## 2024-02-15 ENCOUNTER — Ambulatory Visit (INDEPENDENT_AMBULATORY_CARE_PROVIDER_SITE_OTHER)
Admission: RE | Admit: 2024-02-15 | Discharge: 2024-02-15 | Disposition: A | Discharge status: Home / Self Care | Source: Ambulatory Visit | Attending: Family Medicine | Admitting: Family Medicine

## 2024-02-15 ENCOUNTER — Ambulatory Visit: Payer: Self-pay | Admitting: Family Medicine

## 2024-02-15 VITALS — BP 122/80 | HR 69 | Temp 98.5°F | Ht 62.0 in | Wt 159.2 lb

## 2024-02-15 DIAGNOSIS — M25442 Effusion, left hand: Secondary | ICD-10-CM

## 2024-02-15 DIAGNOSIS — M79645 Pain in left finger(s): Secondary | ICD-10-CM

## 2024-02-15 DIAGNOSIS — S6992XS Unspecified injury of left wrist, hand and finger(s), sequela: Secondary | ICD-10-CM | POA: Diagnosis not present

## 2024-02-15 DIAGNOSIS — S6990XA Unspecified injury of unspecified wrist, hand and finger(s), initial encounter: Secondary | ICD-10-CM | POA: Insufficient documentation

## 2024-02-15 NOTE — Patient Instructions (Signed)
 Xray today  We will reach out with results when xray is read and make a plan    If helpful- use the voltaren gel

## 2024-02-15 NOTE — Assessment & Plan Note (Signed)
 Aftet injury

## 2024-02-15 NOTE — Assessment & Plan Note (Signed)
 Left 4th finger Caught in dog leash  Now continued swelling of proximal pip joint with limited flex/ext and grip (due to stiffness and a little pain)  Was treated with prednisone  initially- helped little / has not had imaging No arthritis changes in hands   Xray of hand/finger today  Differential includes healed fracture , traumatic arthritis , tendon injury

## 2024-02-15 NOTE — Progress Notes (Signed)
 Subjective:    Patient ID: Destiny Harrell, female    DOB: 04-May-1961, 63 y.o.   MRN: [id number]  HPI  Wt Readings from Last 3 Encounters:  02/15/24 159 lb 4 oz (72.2 kg)  01/26/24 156 lb 8 oz (71 kg)  11/25/23 158 lb (71.7 kg)   29.13 kg/m  Vitals:   02/15/24 1550  BP: 122/80  Pulse: 69  Temp: 98.5 F (36.9 C)  SpO2: 98%    Pt presents with c/o  Left  4th finger injury   Happened in June  Finger got twisted in dog leash   Iced it  After a week - developed lump in finger  Was told it was arthritis  Was prescription steroids 10 d (Dr Norleen) - it helped the aching   Swollen  Red mark on the inside   Limited rom of finger - this affects the hand  Bothersome   Not painful unless she tries to flex/grip          Patient Active Problem List   Diagnosis Date Noted   Finger joint swelling, left 02/15/2024   Finger injury 02/15/2024   Finger pain, left 11/25/2023   Right knee meniscal tear 08/18/2023   Right knee pain 08/16/2023   Chronic neck pain 08/16/2023   Abnormal x-ray of knee 08/16/2023   Osteopenia 02/06/2023   Encounter for screening mammogram for breast cancer 12/01/2022   Personal history of breast cancer 12/01/2022   Adenomatous polyp of colon 12/01/2022   Bunion of great toe 12/01/2022   Estrogen deficiency 11/09/2021   Left hip pain 10/26/2021   Hip bursitis, left 07/22/2021   Stress reaction 05/26/2021   Environmental and seasonal allergies 05/12/2021   Mild intermittent asthma without complication 05/12/2021   Hyperlipidemia 10/21/2020   Routine general medical examination at a health care facility 10/13/2020   Vitamin D  deficiency 10/13/2020   History of fibula fracture 02/26/2020   Smoker 01/18/2020   Irritable bowel syndrome with constipation 04/05/2019   Past Medical History:  Diagnosis Date   Breast cancer (HCC)    Depression    Hyperlipemia    IBS (irritable bowel syndrome)    Migraines    Personal history of radiation  therapy    Past Surgical History:  Procedure Laterality Date   BREAST BIOPSY Left 08/16/2012   positive    BREAST LUMPECTOMY Left 08/31/2012   BREAST LUMPECTOMY WITH NEEDLE LOCALIZATION AND AXILLARY SENTINEL LYMPH NODE BX Left 08/31/2012   Procedure: left BREAST needle localized  LUMPECTOMY WITH left sentinel lymph node mapping ;  Surgeon: Debby A. Cornett, MD;  Location: [facility];  Service: General;  Laterality: Left;  needle localization at breast center of [place] 7:30    BREAST SURGERY  08/31/2012   [id number]   CESAREAN SECTION  1984, 1986, 1988   CHOLECYSTECTOMY  2010   COLONOSCOPY  04/24/2019   HERNIA REPAIR  2001   Bowel   OOPHORECTOMY  Right 1999, Left 2001   POLYPECTOMY     TUBAL LIGATION  1988   Social History   Tobacco Use   Smoking status: Every Day    Current packs/day: 0.25    Average packs/day: 0.3 packs/day for 34.9 years (8.7 ttl pk-yrs)    Types: Cigarettes    Start date: 04/03/1989   Smokeless tobacco: Never   Tobacco comments:    4 cigarettes per day   Vaping Use   Vaping status: Never Used  Substance Use Topics   Alcohol  use: Yes    Comment: rare   Drug use: No   Family History  Problem Relation Age of Onset   Breast cancer Mother 77   Arthritis Mother    Hypertension Mother    Cancer Mother    Cancer Father        Hodgkin Lymphoma   Other Father        twisted colon   Arthritis Sister    Hyperlipidemia Sister    Depression Brother    Cancer Son        sarcoma of leg, lung and brain   Colon cancer Neg Hx    Esophageal cancer Neg Hx    Stomach cancer Neg Hx    Rectal cancer Neg Hx    No Known Allergies Current Outpatient Medications on File Prior to Visit  Medication Sig Dispense Refill   albuterol  (VENTOLIN  HFA) 108 (90 Base) MCG/ACT inhaler INHALE 2 PUFFS INTO THE LUNGS EVERY 4 (FOUR) HOURS AS NEEDED FOR SHORTNESS OF BREATH 8 each 5   ezetimibe  (ZETIA ) 10 MG tablet Take 1 tablet (10 mg total) by mouth daily. 90  tablet 3   Multiple Vitamins-Minerals (MULTIVITAMIN WITH MINERALS) tablet Take 1 tablet by mouth daily.     No current facility-administered medications on file prior to visit.    Review of Systems  Constitutional:  Negative for fatigue and fever.  Musculoskeletal:  Positive for arthralgias and joint swelling. Negative for myalgias.       Left 4th finger swelling and stiffness  Pain with grip   Skin:  Negative for color change.  Hematological:  Negative for adenopathy.       Objective:   Physical Exam Constitutional:      General: She is not in acute distress.    Appearance: Normal appearance. She is not ill-appearing or diaphoretic.     Comments: Overwt   Cardiovascular:     Rate and Rhythm: Normal rate and regular rhythm.  Musculoskeletal:     Comments: Left 4th finger  Swelling of proximal pip joint  Limited flex and extension  Some slight flexion deformity  No erythema or warmth or tenderness  No crepitus   No other joint changes   Skin:    Findings: No bruising, erythema or rash.  Neurological:     Mental Status: She is alert.  Psychiatric:        Mood and Affect: Mood normal.           Assessment & Plan:   Problem List Items Addressed This Visit       Other   Finger joint swelling, left - Primary   Aftet injury       Relevant Orders   DG Hand Complete Left (Completed)   Finger injury   Left 4th finger Caught in dog leash  Now continued swelling of proximal pip joint with limited flex/ext and grip (due to stiffness and a little pain)  Was treated with prednisone  initially- helped little / has not had imaging No arthritis changes in hands   Xray of hand/finger today  Differential includes healed fracture , traumatic arthritis , tendon injury

## 2024-02-27 ENCOUNTER — Ambulatory Visit: Admitting: Orthopedic Surgery

## 2024-02-27 DIAGNOSIS — S63639A Sprain of interphalangeal joint of unspecified finger, initial encounter: Secondary | ICD-10-CM

## 2024-02-27 NOTE — Progress Notes (Signed)
 Destiny Harrell - 63 y.o. female MRN [id number]  Date of birth: 01-06-1961  Office Visit Note: Visit Date: 02/27/2024 PCP: Randeen Laine LABOR, MD Referred by: Tower, Laine LABOR, MD  Subjective: No chief complaint on file.  HPI: Destiny Harrell is a pleasant 63 y.o. female who presents today for left ring finger pain and swelling at the PIP region over the past 3 months.  Injury mechanism described as the feeling entrapped in a dog leash, large dog approximate 95 pounds.  She has had ongoing pain and swelling at the left ring finger PIP region, has been unable to put her ring back on due to the ongoing swelling.  Has been doing range of motion and activities as tolerated.  Pain is fairly minimal at this time.  She is concerned about the persistent swelling.  Pertinent ROS were reviewed with the patient and found to be negative unless otherwise specified above in HPI.   Visit Reason: Left ring finger PIP sprain Duration of symptoms: 3 months Hand dominance: right Occupation: Print production planner Diabetic: No Smoking: Yes, 10 cigarettes a day Heart/Lung History: none Blood Thinners: none  Prior Testing/EMG: x-rays Injections (Date): none Treatments: predisone Prior Surgery: none  Assessment & Plan: Visit Diagnoses:  1. Sprain of proximal interphalangeal (PIP) joint of finger     Plan: Extensive discussion was had with the patient today regarding her left ring finger PIP joint sprain.  I explained the underlying etiology and pathophysiology of this condition.  I did also explained that this injury does take an extensive period of time for reduction in swelling.  She should continue with range of motion exercises and activities as tolerated.  Given that she has minimal pain, I do not see any need for immobilization, splinting or nonsteroidal anti-inflammatory medication.  I did show her Coban wrapping today for joint support to be utilized as needed.  She expressed understanding of the injury  mechanism, underlying issue and the timeline of recovery.  Will return to me as needed.  Follow-up: No follow-ups on file.   Meds & Orders: No orders of the defined types were placed in this encounter.  No orders of the defined types were placed in this encounter.    Procedures: No procedures performed      Clinical History: No specialty comments available.  She reports that she has been smoking cigarettes. She started smoking about 34 years ago. She has a 8.7 pack-year smoking history. She has never used smokeless tobacco. No results for input(s): HGBA1C, LABURIC in the last 8760 hours.  Objective:   Vital Signs: LMP 06/22/1999 (Approximate)   Physical Exam  Gen: Well-appearing, in no acute distress; non-toxic CV: Regular Rate. Well-perfused. Warm.  Resp: Breathing unlabored on room air; no wheezing. Psych: Fluid speech in conversation; appropriate affect; normal thought process  Ortho Exam Left hand: Ring finger - Notable swelling over the PIP region, range of motion is limited 5-45 actively, slightly improved passively, no significant joint instability with stress testing of the collaterals, minimal pain to deep palpation  Imaging: No results found. Prior x-rays of the left hand from 8/27 were reviewed independently by myself today  Past Medical/Family/Surgical/Social History: Medications & Allergies reviewed per EMR, new medications updated. Patient Active Problem List   Diagnosis Date Noted   Finger joint swelling, left 02/15/2024   Finger injury 02/15/2024   Finger pain, left 11/25/2023   Right knee meniscal tear 08/18/2023   Right knee pain 08/16/2023   Chronic neck  pain 08/16/2023   Abnormal x-ray of knee 08/16/2023   Osteopenia 02/06/2023   Encounter for screening mammogram for breast cancer 12/01/2022   Personal history of breast cancer 12/01/2022   Adenomatous polyp of colon 12/01/2022   Bunion of great toe 12/01/2022   Estrogen deficiency 11/09/2021    Left hip pain 10/26/2021   Hip bursitis, left 07/22/2021   Stress reaction 05/26/2021   Environmental and seasonal allergies 05/12/2021   Mild intermittent asthma without complication 05/12/2021   Hyperlipidemia 10/21/2020   Routine general medical examination at a health care facility 10/13/2020   Vitamin D  deficiency 10/13/2020   History of fibula fracture 02/26/2020   Smoker 01/18/2020   Irritable bowel syndrome with constipation 04/05/2019   Past Medical History:  Diagnosis Date   Breast cancer (HCC)    Depression    Hyperlipemia    IBS (irritable bowel syndrome)    Migraines    Personal history of radiation therapy    Family History  Problem Relation Age of Onset   Breast cancer Mother 59   Arthritis Mother    Hypertension Mother    Cancer Mother    Cancer Father        Hodgkin Lymphoma   Other Father        twisted colon   Arthritis Sister    Hyperlipidemia Sister    Depression Brother    Cancer Son        sarcoma of leg, lung and brain   Colon cancer Neg Hx    Esophageal cancer Neg Hx    Stomach cancer Neg Hx    Rectal cancer Neg Hx    Past Surgical History:  Procedure Laterality Date   BREAST BIOPSY Left 08/16/2012   positive    BREAST LUMPECTOMY Left 08/31/2012   BREAST LUMPECTOMY WITH NEEDLE LOCALIZATION AND AXILLARY SENTINEL LYMPH NODE BX Left 08/31/2012   Procedure: left BREAST needle localized  LUMPECTOMY WITH left sentinel lymph node mapping ;  Surgeon: Debby A. Cornett, MD;  Location: [facility];  Service: General;  Laterality: Left;  needle localization at breast center of [place] 7:30    BREAST SURGERY  08/31/2012   [id number]   CESAREAN SECTION  1984, 1986, 1988   CHOLECYSTECTOMY  2010   COLONOSCOPY  04/24/2019   HERNIA REPAIR  2001   Bowel   OOPHORECTOMY  Right 1999, Left 2001   POLYPECTOMY     TUBAL LIGATION  1988   Social History   Occupational History    Employer: [employer]  Tobacco Use   Smoking  status: Every Day    Current packs/day: 0.25    Average packs/day: 0.3 packs/day for 34.9 years (8.7 ttl pk-yrs)    Types: Cigarettes    Start date: 04/03/1989   Smokeless tobacco: Never   Tobacco comments:    4 cigarettes per day   Vaping Use   Vaping status: Never Used  Substance and Sexual Activity   Alcohol use: Yes    Comment: rare   Drug use: No   Sexual activity: Yes    Yaniyah Koors Afton Alderton, M.D. [facility], Hand Surgery

## 2024-03-08 ENCOUNTER — Other Ambulatory Visit (INDEPENDENT_AMBULATORY_CARE_PROVIDER_SITE_OTHER)

## 2024-03-08 ENCOUNTER — Ambulatory Visit: Payer: Self-pay | Admitting: Family Medicine

## 2024-03-08 DIAGNOSIS — E7800 Pure hypercholesterolemia, unspecified: Secondary | ICD-10-CM | POA: Diagnosis not present

## 2024-03-08 LAB — LIPID PANEL
Cholesterol: 186 mg/dL (ref 0–200)
HDL: 54.8 mg/dL (ref >39.00)
LDL Cholesterol: 119 mg/dL — ABNORMAL HIGH (ref 0–99)
NonHDL: 130.9
Total CHOL/HDL Ratio: 3
Triglycerides: 60 mg/dL (ref 0.0–149.0)
VLDL: 12 mg/dL (ref 0.0–40.0)

## 2024-03-08 LAB — ALT: ALT: 24 U/L (ref 0–35)

## 2024-03-08 LAB — AST: AST: 26 U/L (ref 0–37)

## 2024-04-23 ENCOUNTER — Encounter: Payer: Self-pay | Admitting: Radiology

## 2024-04-25 ENCOUNTER — Ambulatory Visit: Payer: Self-pay | Admitting: Family Medicine

## 2024-04-25 ENCOUNTER — Ambulatory Visit (INDEPENDENT_AMBULATORY_CARE_PROVIDER_SITE_OTHER)
Admission: RE | Admit: 2024-04-25 | Discharge: 2024-04-25 | Disposition: A | Discharge status: Home / Self Care | Source: Ambulatory Visit | Attending: Family Medicine | Admitting: Family Medicine

## 2024-04-25 ENCOUNTER — Ambulatory Visit: Admitting: Family Medicine

## 2024-04-25 VITALS — BP 116/64 | HR 67 | Temp 98.7°F | Ht 62.0 in | Wt 154.1 lb

## 2024-04-25 DIAGNOSIS — R0981 Nasal congestion: Secondary | ICD-10-CM

## 2024-04-25 DIAGNOSIS — M542 Cervicalgia: Secondary | ICD-10-CM

## 2024-04-25 DIAGNOSIS — R202 Paresthesia of skin: Secondary | ICD-10-CM | POA: Diagnosis not present

## 2024-04-25 DIAGNOSIS — F17200 Nicotine dependence, unspecified, uncomplicated: Secondary | ICD-10-CM

## 2024-04-25 DIAGNOSIS — G8929 Other chronic pain: Secondary | ICD-10-CM | POA: Diagnosis not present

## 2024-04-25 DIAGNOSIS — J4520 Mild intermittent asthma, uncomplicated: Secondary | ICD-10-CM | POA: Diagnosis not present

## 2024-04-25 DIAGNOSIS — J3489 Other specified disorders of nose and nasal sinuses: Secondary | ICD-10-CM

## 2024-04-25 LAB — POCT INFLUENZA A/B
Influenza A, POC: NEGATIVE
Influenza B, POC: NEGATIVE

## 2024-04-25 LAB — POC COVID19 BINAXNOW: SARS Coronavirus 2 Ag: NEGATIVE

## 2024-04-25 NOTE — Assessment & Plan Note (Signed)
 Started last night with some pnd and congestion Suspect early uri   Neg covid and flu test  Discussed symptomatic care  Update if not starting to improve in a week or if worsening  Call back and Er precautions noted in detail today

## 2024-04-25 NOTE — Patient Instructions (Signed)
 Neck xray today  We will reach out with results   Take prednisone  30 mg taper as directed- for neck/hands and wheezing   Let us  know if not improved in another week  If worse- call sooner   Keep up the good work with smoking cessation   Most likely head cold vs allergies  Prednisone  should help  Treat your symptoms and keep us  posted

## 2024-04-25 NOTE — Assessment & Plan Note (Signed)
 Suspect radicular after neck injury (acute on chronic neck problem)  CS xray ordered  Prednisone  30 mg taper  Will update  Call back and Er precautions noted in detail today

## 2024-04-25 NOTE — Progress Notes (Signed)
 Subjective:    Patient ID: Destiny Harrell, female    DOB: [birth year], 62 y.o.   MRN: [id number]  HPI  Wt Readings from Last 3 Encounters:  04/25/24 154 lb 2 oz (69.9 kg)  02/15/24 159 lb 4 oz (72.2 kg)  01/26/24 156 lb 8 oz (71 kg)   28.19 kg/m  Vitals:   04/25/24 1455  BP: 116/64  Pulse: 67  Temp: 98.7 F (37.1 C)  SpO2: 98%   Pt presents for  Follow up of uri ([place]  Mechanical fall with hand pain   Was in [place]  Has upper respiratory infection with wheezing  Used albuterol  inhaler  Got better     Woke up this am with more congestion  Not wheezing  Not much cough  Runny nose since 2 am  Took a zyrtec  Is tired from jet lag  No ST  No fever last night    Had a fall on oct 22nd Had 2 beers and a glass of wine so balance was not optimal  Was walking outside at night in bare feet  Squatted down to put out a cig (last one)  Clemens forward with face on the ground  Both arms were burning  Abrasions on face   Did go to ER the next day  No headache  They said she had a heart M and had an EKG (that was normal)   Some some degree of chronic neck pain for 12 years or more  Hard to tell if worse   Took some of her daughter's gabapentin  Sensitivity in hands (thumbs) and into forearms    No cig or etoh since     Quit smoking oct 22  Wonders if she has copd  Smoked on/off since age 12     Patient Active Problem List   Diagnosis Date Noted   Paresthesia of both hands 04/25/2024   Rhinorrhea 04/25/2024   Finger joint swelling, left 02/15/2024   Finger injury 02/15/2024   Finger pain, left 11/25/2023   Right knee meniscal tear 08/18/2023   Right knee pain 08/16/2023   Chronic neck pain 08/16/2023   Abnormal x-ray of knee 08/16/2023   Osteopenia 02/06/2023   Encounter for screening mammogram for breast cancer 12/01/2022   Personal history of breast cancer 12/01/2022   Adenomatous polyp of colon 12/01/2022   Bunion of great toe  12/01/2022   Estrogen deficiency 11/09/2021   Left hip pain 10/26/2021   Hip bursitis, left 07/22/2021   Stress reaction 05/26/2021   Environmental and seasonal allergies 05/12/2021   Mild intermittent asthma without complication 05/12/2021   Hyperlipidemia 10/21/2020   Routine general medical examination at a health care facility 10/13/2020   Vitamin D  deficiency 10/13/2020   History of fibula fracture 02/26/2020   Irritable bowel syndrome with constipation 04/05/2019   Past Medical History:  Diagnosis Date   Breast cancer (HCC)    Depression    Hyperlipemia    IBS (irritable bowel syndrome)    Migraines    Personal history of radiation therapy    Past Surgical History:  Procedure Laterality Date   BREAST BIOPSY Left 08/16/2012   positive    BREAST LUMPECTOMY Left 08/31/2012   BREAST LUMPECTOMY WITH NEEDLE LOCALIZATION AND AXILLARY SENTINEL LYMPH NODE BX Left 08/31/2012   Procedure: left BREAST needle localized  LUMPECTOMY WITH left sentinel lymph node mapping ;  Surgeon: Debby A. Cornett, MD;  Location: [facility];  Service:  General;  Laterality: Left;  needle localization at breast center of [place] 7:30    BREAST SURGERY  08/31/2012   [id number]   CESAREAN SECTION  1984, 1986, 1988   CHOLECYSTECTOMY  2010   COLONOSCOPY  04/24/2019   HERNIA REPAIR  2001   Bowel   OOPHORECTOMY  Right 1999, Left 2001   POLYPECTOMY     TUBAL LIGATION  1988   Social History   Tobacco Use   Smoking status: Former    Current packs/day: 0.00    Average packs/day: 0.3 packs/day for 35.0 years (8.8 ttl pk-yrs)    Types: Cigarettes    Start date: 04/03/1989    Quit date: 04/11/2024    Years since quitting: 0.0   Smokeless tobacco: Never   Tobacco comments:    4 cigarettes per day   Vaping Use   Vaping status: Never Used  Substance Use Topics   Alcohol use: Yes    Comment: rare   Drug use: No   Family History  Problem Relation Age of Onset   Breast cancer Mother  80   Arthritis Mother    Hypertension Mother    Cancer Mother    Cancer Father        Hodgkin Lymphoma   Other Father        twisted colon   Arthritis Sister    Hyperlipidemia Sister    Depression Brother    Cancer Son        sarcoma of leg, lung and brain   Colon cancer Neg Hx    Esophageal cancer Neg Hx    Stomach cancer Neg Hx    Rectal cancer Neg Hx    No Known Allergies Current Outpatient Medications on File Prior to Visit  Medication Sig Dispense Refill   albuterol  (VENTOLIN  HFA) 108 (90 Base) MCG/ACT inhaler INHALE 2 PUFFS INTO THE LUNGS EVERY 4 (FOUR) HOURS AS NEEDED FOR SHORTNESS OF BREATH 8 each 5   ezetimibe  (ZETIA ) 10 MG tablet Take 1 tablet (10 mg total) by mouth daily. 90 tablet 3   Multiple Vitamins-Minerals (MULTIVITAMIN WITH MINERALS) tablet Take 1 tablet by mouth daily.     No current facility-administered medications on file prior to visit.    Review of Systems  Constitutional:  Positive for fatigue. Negative for activity change, appetite change, fever and unexpected weight change.  HENT:  Positive for congestion, postnasal drip and rhinorrhea. Negative for ear pain, sinus pressure, sore throat and trouble swallowing.   Eyes:  Negative for pain, redness and visual disturbance.  Respiratory:  Positive for cough and wheezing. Negative for shortness of breath.   Cardiovascular:  Negative for chest pain and palpitations.  Gastrointestinal:  Negative for abdominal pain, blood in stool, constipation and diarrhea.  Endocrine: Negative for polydipsia and polyuria.  Genitourinary:  Negative for dysuria, frequency and urgency.  Musculoskeletal:  Positive for neck pain. Negative for arthralgias, back pain, myalgias and neck stiffness.  Skin:  Negative for pallor and rash.  Allergic/Immunologic: Negative for environmental allergies.  Neurological:  Negative for dizziness, syncope and headaches.       Tingly pain in hands/lower arms  Improved   Hematological:   Negative for adenopathy. Does not bruise/bleed easily.  Psychiatric/Behavioral:  Negative for decreased concentration and dysphoric mood. The patient is not nervous/anxious.        Objective:   Physical Exam Constitutional:      General: She is not in acute distress.    Appearance: Normal appearance. She is  well-developed and normal weight. She is not ill-appearing or diaphoretic.  HENT:     Head: Normocephalic and atraumatic.     Comments: No facial tenderness     Right Ear: Tympanic membrane and ear canal normal.     Left Ear: Tympanic membrane and ear canal normal.     Nose: Congestion and rhinorrhea present.     Mouth/Throat:     Mouth: Mucous membranes are moist.     Pharynx: Oropharynx is clear.  Eyes:     General: No scleral icterus.       Right eye: No discharge.        Left eye: No discharge.     Conjunctiva/sclera: Conjunctivae normal.     Pupils: Pupils are equal, round, and reactive to light.  Neck:     Thyroid: No thyromegaly.     Vascular: No carotid bruit or JVD.     Comments: Full rom -mild discomfort on extension  No CS tenderness Mild trap tenderness  No crepitus  Cardiovascular:     Rate and Rhythm: Normal rate and regular rhythm.     Heart sounds: Normal heart sounds.     No gallop.  Pulmonary:     Effort: Pulmonary effort is normal. No respiratory distress.     Breath sounds: No stridor. Wheezing present. No rhonchi or rales.     Comments: Wheeze with forced and prolonged exp    Abdominal:     General: There is no distension or abdominal bruit.     Palpations: Abdomen is soft.     Tenderness: There is no abdominal tenderness.  Musculoskeletal:     Cervical back: Normal range of motion and neck supple.     Right lower leg: No edema.     Left lower leg: No edema.  Lymphadenopathy:     Cervical: No cervical adenopathy.  Skin:    General: Skin is warm and dry.     Coloration: Skin is not jaundiced or pale.     Findings: No bruising or rash.   Neurological:     Mental Status: She is alert.     Cranial Nerves: No cranial nerve deficit, dysarthria or facial asymmetry.     Sensory: Sensation is intact.     Motor: No weakness, tremor, atrophy, abnormal muscle tone or pronator drift.     Coordination: Romberg sign negative. Coordination normal.     Deep Tendon Reflexes: Reflexes are normal and symmetric. Reflexes normal.     Comments: Neg tinel and phalen tests   Psychiatric:        Mood and Affect: Mood normal.           Assessment & Plan:   Problem List Items Addressed This Visit       Respiratory   Mild intermittent asthma without complication   This worsened in setting [place]  Overall improved  Wheeze only on forded exp today   Prednisone  30 mg taper  Commended on smoking cessation  Will monitor closely -pt interested in work up for copd in future if not improved  Continue albuterol  mdi prn  Call back and Er precautions noted in detail today          Other   RESOLVED: Smoker   Rhinorrhea   Started last night with some pnd and congestion Suspect early uri   Neg covid and flu test  Discussed symptomatic care  Update if not starting to improve in a week or if  worsening  Call back and Er precautions noted in detail today        Paresthesia of both hands - Primary   Suspect radicular after neck injury (acute on chronic neck problem)  CS xray ordered  Prednisone  30 mg taper  Will update  Call back and Er precautions noted in detail today        Relevant Orders   DG Cervical Spine Complete (Completed)   Chronic neck pain   Acute on chronic after fall (from squatting) while [place] the [place] This resulted in tingly pain and paresthesias in hands and lower forearms  Severe at first-now improved (did take 2 d of gabapentin from fam member)  Suspect radicular pain  Reassuring exam  CS xray today  Monitor closely  Prednisone  30 mg taper may be helpful       Relevant Orders    DG Cervical Spine Complete (Completed)   Other Visit Diagnoses       Nasal congestion       Relevant Orders   POC COVID-19 (Completed)   POCT Influenza A/B (Completed)

## 2024-04-25 NOTE — Assessment & Plan Note (Signed)
 Acute on chronic after fall (from squatting) [place] This resulted in tingly pain and paresthesias in hands and lower forearms  Severe at first-now improved (did take 2 d of gabapentin from fam member)  Suspect radicular pain  Reassuring exam  CS xray today  Monitor closely  Prednisone  30 mg taper may be helpful

## 2024-04-25 NOTE — Assessment & Plan Note (Signed)
 This worsened in setting of uri while [place]  Overall improved  Wheeze only on forded exp today   Prednisone  30 mg taper  Commended on smoking cessation  Will monitor closely -pt interested in work up for copd in future if not improved  Continue albuterol  mdi prn  Call back and Er precautions noted in detail today

## 2024-04-26 MED ORDER — PREDNISONE 10 MG PO TABS
ORAL_TABLET | ORAL | 0 refills | Status: AC
Start: 2024-04-26 — End: ?

## 2024-06-19 ENCOUNTER — Telehealth: Payer: Self-pay | Admitting: Family Medicine

## 2024-06-19 DIAGNOSIS — G8929 Other chronic pain: Secondary | ICD-10-CM

## 2024-06-19 NOTE — Telephone Encounter (Signed)
 I put the referral in for orthopedics  Please let us  know if you don't hear in 1-2 weeks to set that up (mychart message or call or letter)

## 2024-06-19 NOTE — Addendum Note (Signed)
 Addended by: RANDEEN HARDY A on: 06/19/2024 08:27 PM   Modules accepted: Orders

## 2024-06-19 NOTE — Telephone Encounter (Signed)
 Copied from CRM [id number]. Topic: Referral - Request for Referral >> Jun 19, 2024  9:42 AM Delon DASEN wrote: Did the patient discuss referral with their provider in the last year? Yes (If No - schedule appointment) (If Yes - send message)  Appointment offered? No  Type of order/referral and detailed reason for visit: spine specialist  Preference of office, provider, location: n/a  If referral order, have you been seen by this specialty before? No (If Yes, this issue or another issue? When? Where?  Can we respond through MyChart? Yes

## 2024-06-20 NOTE — Telephone Encounter (Signed)
 Called patient reviewed all information and repeated back to me. Will call if any questions.  ? ?

## 2024-06-22 ENCOUNTER — Ambulatory Visit (INDEPENDENT_AMBULATORY_CARE_PROVIDER_SITE_OTHER)

## 2024-06-22 DIAGNOSIS — M5010 Cervical disc disorder with radiculopathy, unspecified cervical region: Secondary | ICD-10-CM

## 2024-06-22 NOTE — Progress Notes (Signed)
 "  Office Visit Note   Patient: Destiny Harrell           Date of Birth: [birth year]           MRN: [id number] Visit Date: 06/22/2024              Requested by: Tower, Laine LABOR, MD [address] [place],  [place] [address] PCP: Randeen Laine LABOR, MD   Assessment & Plan: Visit Diagnoses:  1. Cervical disc disorder with radiculopathy, unspecified cervical region     Plan: Discussed the nature of C-spine injuries and their usual treatment and course. Recommend MRI of the neck and will follow up after MRI for recommendations  Orders:  Orders Placed This Encounter  Procedures   MR Cervical Spine Wo Contrast   No orders of the defined types were placed in this encounter.    Subjective: Chief Complaint: Neck pain  HPI The patient is a 64 y.o. female who presents for an evaluation of neck pain. Symptoms have been present for several years and are gradually worsening.  Onset was related to / precipitated by no known injury. The pain is located in the neck diffusely with radiation to both arms and hands. Symptoms have been intermittent since that time. The patient has had recurrent self limited episodes of neck pain in the past. There is numbness, tingling, weakness in the arms.  Objective: Vital Signs: LMP 06/22/1999   Physical Exam Gen: Alert, No Acute Distress Cervical spine: Skin intact, no erythema or induration noted. normal C-spine, no tenderness, FROM without pain. Normal strength and reflexes throughout upper extremities. Normal biceps reflexes. Sensation intact throughout upper extremities  Imaging: Radiographs personally reviewed by me; reveal severe degenerative disc disease of the cervical spine   PMFS History: Patient Active Problem List   Diagnosis Date Noted   Paresthesia of both hands 04/25/2024   Rhinorrhea 04/25/2024   Finger joint swelling, left 02/15/2024   Finger injury 02/15/2024   Finger pain, left 11/25/2023   Right knee meniscal tear 08/18/2023    Right knee pain 08/16/2023   Chronic neck pain 08/16/2023   Abnormal x-ray of knee 08/16/2023   Osteopenia 02/06/2023   Encounter for screening mammogram for breast cancer 12/01/2022   Personal history of breast cancer 12/01/2022   Adenomatous polyp of colon 12/01/2022   Bunion of great toe 12/01/2022   Estrogen deficiency 11/09/2021   Left hip pain 10/26/2021   Hip bursitis, left 07/22/2021   Stress reaction 05/26/2021   Environmental and seasonal allergies 05/12/2021   Mild intermittent asthma without complication 05/12/2021   Hyperlipidemia 10/21/2020   Routine general medical examination at a health care facility 10/13/2020   Vitamin D  deficiency 10/13/2020   History of fibula fracture 02/26/2020   Irritable bowel syndrome with constipation 04/05/2019   Past Medical History:  Diagnosis Date   Breast cancer (HCC)    Depression    Hyperlipemia    IBS (irritable bowel syndrome)    Migraines    Personal history of radiation therapy     Family History  Problem Relation Age of Onset   Breast cancer Mother 8   Arthritis Mother    Hypertension Mother    Cancer Mother    Cancer Father        Hodgkin Lymphoma   Other Father        twisted colon   Arthritis Sister    Hyperlipidemia Sister    Depression Brother    Cancer Son  sarcoma of leg, lung and brain   Colon cancer Neg Hx    Esophageal cancer Neg Hx    Stomach cancer Neg Hx    Rectal cancer Neg Hx     Past Surgical History:  Procedure Laterality Date   BREAST BIOPSY Left 08/16/2012   positive    BREAST LUMPECTOMY Left 08/31/2012   BREAST LUMPECTOMY WITH NEEDLE LOCALIZATION AND AXILLARY SENTINEL LYMPH NODE BX Left 08/31/2012   Procedure: left BREAST needle localized  LUMPECTOMY WITH left sentinel lymph node mapping ;  Surgeon: Debby A. Cornett, MD;  Location: [facility];  Service: General;  Laterality: Left;  needle localization at breast center of [place] 7:30    BREAST SURGERY   08/31/2012   [id number]-   CESAREAN SECTION  1984, 1986, 1988   CHOLECYSTECTOMY  2010   COLONOSCOPY  04/24/2019   HERNIA REPAIR  2001   Bowel   OOPHORECTOMY  Right 1999, Left 2001   POLYPECTOMY     TUBAL LIGATION  1988   Social History   Occupational History    Employer: [employer]  Tobacco Use   Smoking status: Former    Current packs/day: 0.00    Average packs/day: 0.3 packs/day for 35.0 years (8.8 ttl pk-yrs)    Types: Cigarettes    Start date: 04/03/1989    Quit date: 04/11/2024    Years since quitting: 0.1   Smokeless tobacco: Never   Tobacco comments:    4 cigarettes per day   Vaping Use   Vaping status: Never Used  Substance and Sexual Activity   Alcohol use: Yes    Comment: rare   Drug use: No   Sexual activity: Yes   Current Outpatient Medications  Medication Instructions   albuterol  (VENTOLIN  HFA) 108 (90 Base) MCG/ACT inhaler 2 puffs, Inhalation, Every 4 hours PRN   ezetimibe  (ZETIA ) 10 mg, Oral, Daily   Multiple Vitamins-Minerals (MULTIVITAMIN WITH MINERALS) tablet 1 tablet, Daily   predniSONE  (DELTASONE ) 10 MG tablet Take 3 pills once daily by mouth for 3 days, then 2 pills once daily for 3 days, then 1 pill once daily for 3 days and then stop   Allergies as of 06/22/2024   (No Known Allergies)     "

## 2024-06-27 ENCOUNTER — Telehealth: Payer: Self-pay

## 2024-06-27 NOTE — Telephone Encounter (Signed)
Pt returned call and appt was scheduled.

## 2024-06-27 NOTE — Telephone Encounter (Signed)
 Lvm for pt to cb to schedule post Mri appt with Dr Mariah

## 2024-06-29 ENCOUNTER — Ambulatory Visit: Admission: RE | Admit: 2024-06-29 | Discharge: 2024-06-29 | Disposition: A | Source: Ambulatory Visit

## 2024-06-29 DIAGNOSIS — M5010 Cervical disc disorder with radiculopathy, unspecified cervical region: Secondary | ICD-10-CM | POA: Insufficient documentation

## 2024-07-05 ENCOUNTER — Ambulatory Visit

## 2024-07-05 VITALS — BP 116/77 | HR 77 | Ht 62.0 in | Wt 154.0 lb

## 2024-07-05 DIAGNOSIS — M5010 Cervical disc disorder with radiculopathy, unspecified cervical region: Secondary | ICD-10-CM | POA: Diagnosis not present

## 2024-07-05 NOTE — Progress Notes (Signed)
 "  Office Visit Note   Patient: Destiny Harrell           Date of Birth: 1961-03-26           MRN: [id number] Visit Date: 07/05/2024              Requested by: Tower, Laine LABOR, MD [address] [place],  [place] [address] PCP: Randeen Laine LABOR, MD   Assessment & Plan: Visit Diagnoses:  1. Cervical disc disorder with radiculopathy, unspecified cervical region     Plan: Discussed the nature of C-spine injuries and their usual treatment and course. Discussed the patients MRI results; referred to Neurosurgery  for evaluation.  Orders:  Orders Placed This Encounter  Procedures   Ambulatory referral to Neurosurgery   No orders of the defined types were placed in this encounter.    Subjective: Chief Complaint: Neck pain  HPI The patient is a 64 y.o. female who presents for a follow up appointment for neck pain. Patient's symptoms are unchanged since last visit. Patient presents to follow up on her MRI results.  Objective: Vital Signs: BP 116/77 (Cuff Size: Normal)   Pulse 77   Ht 5' 2 (1.575 m)   Wt 154 lb (69.9 kg)   LMP 06/22/1999   BMI 28.17 kg/m   Physical Exam Gen: Alert, No Acute Distress  MRI: FINDINGS: Normal cervical alignment. Multilevel marginal osteophytosis and moderate disc height loss C3-C4, C4/5, C5-C6, and C6-7. Mild disc height loss C7/T1 with 2 to 3 mm anterolisthesis. No fracture. No paraspinal muscle edema.   Cervical cord is normal in course, caliber, and signal intensity. Cervicomedullary junction is normal.     C2-C3: No disc bulge. Mild facet hypertrophy. Patent central canal and neuroforamina.   C3-C4: Uncovertebral hypertrophy with posterior disc osteophyte ridge. No facet hypertrophy. Mild canal stenosis. Severe left and moderate right foraminal narrowing.   C4-C5: Uncovertebral hypertrophy with disc osteophyte complex asymmetric to the right foramen. No facet hypertrophy. Moderate canal stenosis. Severe bilateral foraminal  narrowing.   C5-C6: Uncovertebral hypertrophy with disc osteophyte ridge. No facet hypertrophy. Mild-to-moderate canal stenosis. Severe right and moderate left foraminal narrowing.   C6-C7: Uncovertebral hypertrophy with disc osteophyte ridge. No facet hypertrophy. Partial effacement of thecal sac. Mild canal stenosis. Mild left foraminal narrowing. Patent right foramen.   C7-T1: Minor grade 1 anterolisthesis. No disc bulge. Mild facet of the feet. Patent central canal. Patent neuroforamina.   IMPRESSION: 1.  Normal cervical alignment without fracture or subluxation.   2. C3/4: Mild canal stenosis. Severe left and moderate right foraminal narrowing.   3.  C4/5: Moderate canal stenosis. Severe bilateral foraminal narrowing.   4. C5-C6: Mild to moderate canal stenosis. Severe right and moderate left foraminal narrowing.   5.  C6-7: Mild canal stenosis. Mild left foraminal narrowing.   6.  Full level by level details above.   Electronically signed by: Margretta Blanch MD 07/01/2024 11:24 AM EST RP Workstation: [id number]  PMFS History: Patient Active Problem List   Diagnosis Date Noted   Paresthesia of both hands 04/25/2024   Rhinorrhea 04/25/2024   Finger joint swelling, left 02/15/2024   Finger injury 02/15/2024   Finger pain, left 11/25/2023   Right knee meniscal tear 08/18/2023   Right knee pain 08/16/2023   Chronic neck pain 08/16/2023   Abnormal x-ray of knee 08/16/2023   Osteopenia 02/06/2023   Encounter for screening mammogram for breast cancer 12/01/2022   Personal history of breast cancer 12/01/2022  Adenomatous polyp of colon 12/01/2022   Bunion of great toe 12/01/2022   Estrogen deficiency 11/09/2021   Left hip pain 10/26/2021   Hip bursitis, left 07/22/2021   Stress reaction 05/26/2021   Environmental and seasonal allergies 05/12/2021   Mild intermittent asthma without complication 05/12/2021   Hyperlipidemia 10/21/2020   Routine general medical examination  at a health care facility 10/13/2020   Vitamin D  deficiency 10/13/2020   History of fibula fracture 02/26/2020   Irritable bowel syndrome with constipation 04/05/2019   Past Medical History:  Diagnosis Date   Breast cancer (HCC)    Depression    Hyperlipemia    IBS (irritable bowel syndrome)    Migraines    Personal history of radiation therapy     Family History  Problem Relation Age of Onset   Breast cancer Mother 27   Arthritis Mother    Hypertension Mother    Cancer Mother    Cancer Father        Hodgkin Lymphoma   Other Father        twisted colon   Arthritis Sister    Hyperlipidemia Sister    Depression Brother    Cancer Son        sarcoma of leg, lung and brain   Colon cancer Neg Hx    Esophageal cancer Neg Hx    Stomach cancer Neg Hx    Rectal cancer Neg Hx     Past Surgical History:  Procedure Laterality Date   BREAST BIOPSY Left 08/16/2012   positive    BREAST LUMPECTOMY Left 08/31/2012   BREAST LUMPECTOMY WITH NEEDLE LOCALIZATION AND AXILLARY SENTINEL LYMPH NODE BX Left 08/31/2012   Procedure: left BREAST needle localized  LUMPECTOMY WITH left sentinel lymph node mapping ;  Surgeon: Debby A. Cornett, MD;  Location: [facility];  Service: General;  Laterality: Left;  needle localization at breast center of [place] 7:30    BREAST SURGERY  08/31/2012   [id number]   CESAREAN SECTION  1984, 1986, 1988   CHOLECYSTECTOMY  2010   COLONOSCOPY  04/24/2019   HERNIA REPAIR  2001   Bowel   OOPHORECTOMY  Right 1999, Left 2001   POLYPECTOMY     TUBAL LIGATION  1988   Social History   Occupational History    Employer: [employer]  Tobacco Use   Smoking status: Former    Current packs/day: 0.00    Average packs/day: 0.3 packs/day for 35.0 years (8.8 ttl pk-yrs)    Types: Cigarettes    Start date: 04/03/1989    Quit date: 04/11/2024    Years since quitting: 0.2   Smokeless tobacco: Never   Tobacco comments:    4 cigarettes per day    Vaping Use   Vaping status: Never Used  Substance and Sexual Activity   Alcohol use: Yes    Comment: rare   Drug use: No   Sexual activity: Yes   Current Outpatient Medications  Medication Instructions   albuterol  (VENTOLIN  HFA) 108 (90 Base) MCG/ACT inhaler 2 puffs, Inhalation, Every 4 hours PRN   ezetimibe  (ZETIA ) 10 mg, Oral, Daily   Multiple Vitamins-Minerals (MULTIVITAMIN WITH MINERALS) tablet 1 tablet, Daily   predniSONE  (DELTASONE ) 10 MG tablet Take 3 pills once daily by mouth for 3 days, then 2 pills once daily for 3 days, then 1 pill once daily for 3 days and then stop   Allergies as of 07/05/2024   (No Known Allergies)   "

## 2024-08-09 ENCOUNTER — Ambulatory Visit: Admitting: Neurosurgery
